# Patient Record
Sex: Female | Born: 1985 | Race: White | Hispanic: No | Marital: Married | Smoking: Never smoker
Health system: Southern US, Community
[De-identification: ages and names within clinical notes are randomized; demographics above are authoritative.]

## PROBLEM LIST (undated history)

## (undated) DIAGNOSIS — F32A Depression, unspecified: Secondary | ICD-10-CM

## (undated) DIAGNOSIS — I2699 Other pulmonary embolism without acute cor pulmonale: Secondary | ICD-10-CM

## (undated) DIAGNOSIS — I82409 Acute embolism and thrombosis of unspecified deep veins of unspecified lower extremity: Secondary | ICD-10-CM

## (undated) DIAGNOSIS — F329 Major depressive disorder, single episode, unspecified: Secondary | ICD-10-CM

## (undated) DIAGNOSIS — D6859 Other primary thrombophilia: Secondary | ICD-10-CM

## (undated) HISTORY — DX: Depression, unspecified: F32.A

## (undated) HISTORY — PX: ANKLE SURGERY: SHX546

## (undated) HISTORY — DX: Major depressive disorder, single episode, unspecified: F32.9

## (undated) SURGERY — LAPAROSCOPIC CHOLECYSTECTOMY
Anesthesia: General

## (undated) SURGERY — Surgical Case
Anesthesia: Spinal

---

## 2013-10-13 ENCOUNTER — Encounter: Payer: Self-pay | Admitting: Obstetrics and Gynecology

## 2014-02-19 ENCOUNTER — Encounter: Payer: Self-pay | Admitting: Maternal & Fetal Medicine

## 2014-05-11 ENCOUNTER — Inpatient Hospital Stay: Payer: Self-pay | Admitting: Certified Nurse Midwife

## 2014-05-11 LAB — CBC WITH DIFFERENTIAL/PLATELET
BASOS ABS: 0.1 10*3/uL (ref 0.0–0.1)
Basophil %: 0.7 %
EOS ABS: 0.1 10*3/uL (ref 0.0–0.7)
Eosinophil %: 0.7 %
HCT: 36.3 % (ref 35.0–47.0)
HGB: 12.3 g/dL (ref 12.0–16.0)
Lymphocyte #: 1.2 10*3/uL (ref 1.0–3.6)
Lymphocyte %: 11 %
MCH: 29.1 pg (ref 26.0–34.0)
MCHC: 33.9 g/dL (ref 32.0–36.0)
MCV: 86 fL (ref 80–100)
Monocyte #: 0.7 x10 3/mm (ref 0.2–0.9)
Monocyte %: 6.5 %
NEUTROS PCT: 81.1 %
Neutrophil #: 8.8 10*3/uL — ABNORMAL HIGH (ref 1.4–6.5)
Platelet: 241 10*3/uL (ref 150–440)
RBC: 4.21 10*6/uL (ref 3.80–5.20)
RDW: 13.9 % (ref 11.5–14.5)
WBC: 10.9 10*3/uL (ref 3.6–11.0)

## 2014-05-12 LAB — PROTIME-INR
INR: 1
Prothrombin Time: 12.6 secs (ref 11.5–14.7)

## 2014-05-12 LAB — APTT: Activated PTT: 26.6 secs (ref 23.6–35.9)

## 2014-05-13 LAB — HEMATOCRIT: HCT: 30.8 % — AB (ref 35.0–47.0)

## 2014-05-14 LAB — CREATININE, SERUM
Creatinine: 0.86 mg/dL (ref 0.60–1.30)
EGFR (African American): >60
EGFR (Non-African Amer.): >60

## 2014-05-21 ENCOUNTER — Emergency Department: Payer: Self-pay | Admitting: Emergency Medicine

## 2014-05-21 LAB — COMPREHENSIVE METABOLIC PANEL
ALK PHOS: 128 U/L — AB (ref 46–116)
Albumin: 3.2 g/dL — ABNORMAL LOW (ref 3.4–5.0)
Anion Gap: 8 (ref 7–16)
BILIRUBIN TOTAL: 0.2 mg/dL (ref 0.2–1.0)
BUN: 13 mg/dL (ref 7–18)
CO2: 26 mmol/L (ref 21–32)
CREATININE: 0.85 mg/dL (ref 0.60–1.30)
Calcium, Total: 9 mg/dL (ref 8.5–10.1)
Chloride: 106 mmol/L (ref 98–107)
EGFR (African American): >60
Glucose: 101 mg/dL — ABNORMAL HIGH (ref 65–99)
Osmolality: 280 (ref 275–301)
POTASSIUM: 3.8 mmol/L (ref 3.5–5.1)
SGOT(AST): 32 U/L (ref 15–37)
SGPT (ALT): 34 U/L (ref 14–63)
Sodium: 140 mmol/L (ref 136–145)
TOTAL PROTEIN: 7.5 g/dL (ref 6.4–8.2)

## 2014-05-21 LAB — CBC
HCT: 37.2 % (ref 35.0–47.0)
HGB: 12.3 g/dL (ref 12.0–16.0)
MCH: 28.7 pg (ref 26.0–34.0)
MCHC: 33 g/dL (ref 32.0–36.0)
MCV: 87 fL (ref 80–100)
Platelet: 374 10*3/uL (ref 150–440)
RBC: 4.28 10*6/uL (ref 3.80–5.20)
RDW: 14.1 % (ref 11.5–14.5)
WBC: 8.3 10*3/uL (ref 3.6–11.0)

## 2014-05-21 LAB — LIPASE, BLOOD: Lipase: 141 U/L (ref 73–393)

## 2014-05-21 LAB — PROTIME-INR
INR: 0.9
PROTHROMBIN TIME: 12 s (ref 11.5–14.7)

## 2014-05-21 LAB — APTT: Activated PTT: 28.8 secs (ref 23.6–35.9)

## 2014-05-21 LAB — TROPONIN I: Troponin-I: <0.02 ng/mL

## 2014-05-29 ENCOUNTER — Ambulatory Visit: Payer: Self-pay | Admitting: Obstetrics and Gynecology

## 2014-06-16 ENCOUNTER — Emergency Department: Payer: Self-pay | Admitting: Student

## 2014-06-29 ENCOUNTER — Encounter
Admit: 2014-06-29 | Disposition: A | Discharge status: Home / Self Care | Payer: Self-pay | Attending: Obstetrics and Gynecology | Admitting: Obstetrics and Gynecology

## 2014-07-20 ENCOUNTER — Ambulatory Visit
Admit: 2014-07-20 | Disposition: A | Discharge status: Home / Self Care | Payer: Self-pay | Attending: Internal Medicine | Admitting: Internal Medicine

## 2014-07-24 ENCOUNTER — Encounter
Admit: 2014-07-24 | Disposition: A | Discharge status: Home / Self Care | Payer: Self-pay | Attending: Obstetrics and Gynecology | Admitting: Obstetrics and Gynecology

## 2014-07-24 ENCOUNTER — Ambulatory Visit
Admit: 2014-07-24 | Disposition: A | Discharge status: Home / Self Care | Payer: Self-pay | Attending: Internal Medicine | Admitting: Internal Medicine

## 2014-07-29 ENCOUNTER — Emergency Department
Admit: 2014-07-29 | Disposition: A | Discharge status: Home / Self Care | Payer: Self-pay | Admitting: Emergency Medicine

## 2014-08-15 NOTE — Consult Note (Signed)
Referral Information:  Reason for Referral History of pulmonary embolimsm, currently pregnant   Referring Physician [facility]   Prenatal Hx Michelle Hogan is a 29 year-old G1 P0 at 77 2/7 weeks ([facility] 05/16/13) persents for recommendations concering a history of pulmonary embolism.  In 2013, Michelle Hogan developed pleuritic left-sided chest pain. Her workup demonstrated a small pulmonary embolism. She reports that there was no evidence of lower extremity clots. She was placed on Xarelto for six months, which she tolerated without difficulty. She states that she was tested for "genetic" causes of blood clots and the testing was normal. She is unsure if she was tested for antiphospholipid antibodies. She did not require ICU stay and reports no long-term complcations from her PE.  Of note, she was taking a combination OCP at the time. She transitioned to [facility], which she had removed prior to becoming pregnant.  She denies leg swelling or pain. Denies chest pain, shortness of breath or palpitations. She denies easy bruising or mucosal bleeding. No vaginal bleeding or abdominal pain.  She has not started anticoagulation this pregnancy but is taking a daily baby aspirin.   Past Obstetrical Hx G1 P0   Home Medications: Medication Instructions Status  multivitamin, prenatal   once a day Active  Aspirin Low Dose 81 mg oral delayed release tablet 1 tab(s) orally once a day Active   Allergies:   No Known Allergies:   Vital Signs/Notes:  Nursing Vital Signs: **Vital Signs.:   22-Jun-15 09:10  Temperature Temperature (F) 98.4  Pulse Pulse 77  Respirations Respirations 18  Systolic BP Systolic BP 130  Diastolic BP (mmHg) Diastolic BP (mmHg) 74  Pulse Ox % Pulse Ox % 99   Perinatal Consult:  PGyn Hx Denies history of abnormal paps or STDs   Past Medical History cont'd 1. PE as above.  2. Denies history of diabetes, hypertension or endocrine disorders   PSurg Hx Ankle fixation. Wisdom teeth  extraction. No complications with either procedure.   FHx Paternal grandfather with bladder cancer and associated VTE in his 3's. No other FH of VTE, early MI or stroke. 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 See HPI   Fever/Chills No   Abdominal Pain No   SOB/DOE No   Chest Pain No   Medications/Allergies Reviewed Medications/Allergies reviewed  No chages to above   Exam:  Today's Weight 238 pounds    Additional Lab/Radiology Notes Height: 5 feet 5 inches BMI 40 kg/m2  Prenatal labs ([facility] 10/09/13): Blood type A negative, antibody screen negative, HIV non-reactive, RPR non-reactive, Hep B neg, Varicella immune, Rubella immune, Hct 41.1, MCV 85, early glucola 128   Impression/Recommendations:  Impression Michelle Hogan is a 29 year-old G1 P0 at 64 2/7 weeks with history of pulmonary embolism. She reports being tested for genetic etiologies of VTE and that this was negatie. Her PCP is Dr. Candie Echevaria, JR. [place]. She is obese and Rh negative with a negative antibody screen.   Recommendations 1. 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 She reports a negative inhertied thrombophilia panel but is unsure if she was tested for antiphospholipid antibodies. We have requested the results of her thrombophilia panel from her PCP. We have also sent antiphospholipid antibodies today. We discussed the risk of recurrent VTE in pregnancy and the potential morbidity  and mortality associated. We discussed the need for pharmacoligic prophylactic anticoagulation. The current ACOG Practic Bulletin recommends prophylactic dosing using Lovenox at 40 mg daily. As her BMI is 40, we will use an intermediate dosing regiment of 30 mg every 12 hours, increasing to 40 mg every 12 hours at 28 weeks (see below for maternal and  fetal surveillance plan). We discussed theoretical risks of anticoagulation. She had no troubles on Xarelto prior.  2. Obestiy. Obestiy is associated with medical comlications of pregnancy such as diabetes and hypertensive disorders. Her early glucola was normal. Recommned to repeat at 28 weeks. There is also increased risk for macrosomia, abnormal labor and need for cesarean delivery.  3. Rh negative. Initial antibody screen was negative. Please repeat at 28 weeks at time of Rhogam administration. Also, utilize Rhogam with any episodes of vaginal bleeding in the pregnancy.    Comments Recs/Plan: -Antiphospholipid antibody screen sent today. If negative, can discontinue the daily baby ASA -We have requested the results of her thrombophilia testing panel from her PCP -Start Lovenox 30 mg every 12 hours. A script was provided and she was sent to [facility] for teaching -Increase to 40 mg every 12 hours at 28 weeks. We will be happy to see her back then to order -At 36 weeks, transition from Lovenox to unfractionated heparin (10,000 Units every 12 hours) -Delivery at 39 weeks, having held her heparin the night prior to and morning of delivery -SCDs on in labor -Restart Lovenox after delivery at 40 mg once daily. Start 12 hours after a vaginal delivery or 24 hours after a cesarean. Continue daily for 6 weeks -Detailed ultrasound at approximately 18 weeks. We will be happy to perform if desired -Offer aneuploidy and ONTD screening -Follow fetal growth with monthly ultrasounds starting at 28 weeks -Twice weekly fetal testing starting at 32 weeks until delivery -Check a CBC in one week to ensure a normal platelet count. Repeat one week after Lovenox dose increase at 28 weeks -Rhogam at 28 weeks    Total Time Spent with Patient 60 minutes   >50% of visit spent in couseling/coordination of care yes   Office Use Only 99244  Level 4 (60min) NEW office consult low complexity   Coding  Description: MATERNAL CONDITIONS/HISTORY INDICATION(S).   Bleeding Disorder and/or Pt on heparin/coumadin/lovenox.   Pulmonary emboli.  Electronic Signatures: Grotegut, [place] (MD)  (Signed 22-Jun-15 11:48)  Authored: Referral, Home Medications, Allergies, Vital Signs/Notes, Consult, Exam, Lab/Radiology Notes, Impression, Other Comments, Billing, Coding Description   Last Updated: 22-Jun-15 11:48 by Grotegut, [place]had (MD)

## 2014-08-15 NOTE — Consult Note (Signed)
Referral Information:  Reason for Referral History of pulmonary embolimsm, currently pregnant.  Here for follow-up consultation regarding third trimester recommendations.   Referring Physician [facility]   Prenatal Hx 29 year-old G1 P0 at 7528 5/7 weeks ([facility] 05/16/14) with history of PE in the setting of travel and OCPs.  A thrombophilia work-up, including LAC, ACA and AB2GP1 drawn this pregnancy were negative.  She is currently on 30 mg of Lovenox bid. Her next US for growth is scheduled next week.   Past Obstetrical Hx G1 P0   Home Medications: Medication Instructions Status  multivitamin, prenatal   once a day Active  Lovenox 30 mg/0.3 mL injectable solution 30 milligram(s) injectable 2 times a day Active   Allergies:   No Known Allergies:   Vital Signs/Notes:  Nursing Vital Signs: **Vital Signs.:   29-Oct-15 14:51  Vital Signs Type Routine  Temperature Temperature (F) 98.2  Celsius 36.7  Temperature Source oral  Pulse Pulse 89  Respirations Respirations 18  Systolic BP Systolic BP 128  Diastolic BP (mmHg) Diastolic BP (mmHg) 67  Mean BP 87  Pulse Ox % Pulse Ox % 98  Pulse Ox Activity Level  At rest  Oxygen Delivery Room Air/ 21 %   Perinatal Consult:  PGyn Hx Denies history of abnormal paps or STDs   Past Medical History cont'd 1. PE as above.  2. Denies history of diabetes, hypertension or endocrine disorders   PSurg Hx Ankle fixation. Wisdom teeth extraction. No complications with either procedure.   FHx Paternal grandfather with bladder cancer and associated VTE in his 5470's. No other FH of VTE, early MI or stroke. 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   Fever/Chills No   Abdominal Pain No   Diarrhea No   Constipation No   Nausea/Vomiting No   SOB/DOE No   Chest Pain No    Dysuria No   Tolerating Diet Yes   Medications/Allergies Reviewed Medications/Allergies reviewed   Exam:  Today's Weight 243 lbs, BMI=41    Additional Lab/Radiology Notes Prenatal labs ([facility] 10/09/13): Blood type A negative, antibody screen negative, HIV non-reactive, RPR non-reactive, Hep B neg, Varicella immune, Rubella immune, Hct 41.1, MCV 85, early glucola 128   Impression/Recommendations:  Impression Michelle Hogan is a 29 year-old G1 P0 at 8228 5/7 weeks with history of pulmonary embolism. Obesity. Negative thrombophilia work-up.   Recommendations 1. As her BMI is > 40, we have recommended an intermediate dosing regimen.  She is currently on 30 mg of enoxaparin every 12 hours.  When she completes her current inventory, she can increase to 40 mg every 12 hours at 28 weeks.  Please check a CBC at her next prenatal visit after her dose increase.  2. We talked about the pros and cons of staying on enoxaparin versus transitioning to unfractionated heparin at [redacted] weeks gestation.  Given that she had a PE, a higher risk event, and given that she isn't sure she wants an epidural (although we woud like her to have the opportunity), we developed a plan that she would remain on enoxparin until she either went into labor or reached [redacted] weeks gestation, at which time the enoxaparin would be held 24 hours prior to induction.  Alternatively, she could be converted to unfractionated heparin (10,000 Units every 12 hours) at  36 weeks.  3. She should have pneumatic compression devices in labor.  4.  Enoxparin should be resumed (40 mg bid) starting 12 hours after a vaginal delivery or 24 hours after a cesarean delivery and continued for at least 6 weeks postpartum.  5. She is aware that she should not use estrogen-containing contraception.  She used the copper IUD before, but had considerable cramping.  She could use the Mirena and was counseled that this would be a good, safe choice for her.  6.  Obesity.  Her early glucola was normal. She is scheduled to have her repeat next week. We would also recommend monthly ultrasounds for growth, weekly to twice weekly testing starting at 32-36 weeks and delivery at 39 weeks with the more frequent testing if she develops gestational diabetes.    7. Rh negative. RHIg per protocol.   Plan:  Ultrasound at what gestational ages Monthly > 28 weeks   Antepartum Testing Twice weekly, Starting at 32 weeks   Delivery Mode Vaginal   Delivery at what gestational age [redacted] weeks   (Removed):     Total Time Spent with Patient 45 minutes   >50% of visit spent in couseling/coordination of care yes   Office Use Only 99215  Office Visit Level 5 ( ) EST comprehensive office/outpt   Coding Description: MATERNAL CONDITIONS/HISTORY INDICATION(S).   Bleeding Disorder and/or Pt on heparin/coumadin/lovenox.   Pulmonary emboli.  Electronic Signatures: Lady Deutscher (MD)  (Signed 29-Oct-15 18:35)  Authored: Referral, Home Medications, Allergies, Vital Signs/Notes, Consult, Exam, Lab/Radiology Notes, Impression, Plan, Other Comments, Billing, Coding Description   Last Updated: 29-Oct-15 18:35 by Lady Deutscher (MD)

## 2014-08-27 ENCOUNTER — Ambulatory Visit: Payer: PRIVATE HEALTH INSURANCE | Admitting: Physical Therapy

## 2014-09-01 NOTE — H&P (Signed)
L&D Evaluation:  History:  HPI Pt is a 29 yo G1 at 39.[redacted] weeks GA with an EDC of 05/16/14 based on a 4423w5d u/s presents for IOL due to h/o PE on lovenox/heparin. PTs prenatal course is significant for obesity with a BMI of 41, ? LGA baby with fetus weighing [id number] grams at 36 weeks, and E. coli UTI. She has a history of PE and has been on lovenox and switched to heparin at 36 weeks. She is A-, RI, VI, GBS negative, and Tdap UTD.   Presents with IOL for h/o PE   Patient's Medical History pulmonary embolism, obesity,   Patient's Surgical History ankle fixation, widom teeth   Medications Pre Natal Vitamins  heparin 81mg  ASA   Allergies NKDA   Social History none   Family History Non-Contributory   ROS:  ROS All systems were reviewed.  HEENT, CNS, GI, GU, Respiratory, CV, Renal and Musculoskeletal systems were found to be normal.   Exam:  Vital Signs stable   General no apparent distress   Mental Status clear   Chest clear   Heart normal sinus rhythm   Abdomen gravid, non-tender   Pelvic no external lesions, 4/80/-1 --2 on admission   Mebranes Intact   FHT normal rate with no decels, 125, moderate variability, +accels, no decels upon admission   Ucx irregular   Skin dry, no lesions, no rashes   Lymph no lymphadenopathy   Impression:  Impression reactive NST, other, IOL for h/o PE, IUP at 39.2   Plan:  Plan EFM/NST, Pitocin induction, anticipate svd   Follow Up Appointment need to schedule   Electronic Signatures: Jannet MantisSubudhi, Hektor Huston (CNM)  (Signed 18-Jan-16 13:08)  Authored: L&D Evaluation   Last Updated: 18-Jan-16 13:08 by Jannet MantisSubudhi, Obaloluwa Delatte (CNM)

## 2014-09-10 ENCOUNTER — Ambulatory Visit: Payer: PRIVATE HEALTH INSURANCE | Admitting: Physical Therapy

## 2015-01-03 ENCOUNTER — Encounter (HOSPITAL_COMMUNITY): Payer: Self-pay | Admitting: Family Medicine

## 2015-01-03 ENCOUNTER — Emergency Department (HOSPITAL_COMMUNITY): Payer: No Typology Code available for payment source

## 2015-01-03 ENCOUNTER — Emergency Department (HOSPITAL_COMMUNITY)
Admission: EM | Admit: 2015-01-03 | Discharge: 2015-01-04 | Disposition: A | Discharge status: Home / Self Care | Payer: No Typology Code available for payment source | Attending: Emergency Medicine | Admitting: Emergency Medicine

## 2015-01-03 DIAGNOSIS — Z86711 Personal history of pulmonary embolism: Secondary | ICD-10-CM | POA: Insufficient documentation

## 2015-01-03 DIAGNOSIS — R0602 Shortness of breath: Secondary | ICD-10-CM

## 2015-01-03 DIAGNOSIS — R079 Chest pain, unspecified: Secondary | ICD-10-CM | POA: Diagnosis present

## 2015-01-03 DIAGNOSIS — Z79899 Other long term (current) drug therapy: Secondary | ICD-10-CM | POA: Insufficient documentation

## 2015-01-03 DIAGNOSIS — Z3202 Encounter for pregnancy test, result negative: Secondary | ICD-10-CM | POA: Diagnosis not present

## 2015-01-03 HISTORY — DX: Other pulmonary embolism without acute cor pulmonale: I26.99

## 2015-01-03 LAB — CBC
HCT: 41 % (ref 36.0–46.0)
HEMOGLOBIN: 13.8 g/dL (ref 12.0–15.0)
MCH: 28.1 pg (ref 26.0–34.0)
MCHC: 33.7 g/dL (ref 30.0–36.0)
MCV: 83.5 fL (ref 78.0–100.0)
PLATELETS: 240 10*3/uL (ref 150–400)
RBC: 4.91 MIL/uL (ref 3.87–5.11)
RDW: 13.6 % (ref 11.5–15.5)
WBC: 8.1 10*3/uL (ref 4.0–10.5)

## 2015-01-03 LAB — BASIC METABOLIC PANEL
ANION GAP: 10 (ref 5–15)
BUN: 19 mg/dL (ref 6–20)
CALCIUM: 9.2 mg/dL (ref 8.9–10.3)
CHLORIDE: 108 mmol/L (ref 101–111)
CO2: 21 mmol/L — ABNORMAL LOW (ref 22–32)
CREATININE: 0.63 mg/dL (ref 0.44–1.00)
GFR calc non Af Amer: >60 mL/min (ref >60)
Glucose, Bld: 148 mg/dL — ABNORMAL HIGH (ref 65–99)
Potassium: 3.7 mmol/L (ref 3.5–5.1)
SODIUM: 139 mmol/L (ref 135–145)

## 2015-01-03 LAB — PREGNANCY, URINE: PREG TEST UR: NEGATIVE

## 2015-01-03 LAB — I-STAT TROPONIN, ED: Troponin i, poc: 0 ng/mL (ref 0.00–0.08)

## 2015-01-03 LAB — D-DIMER, QUANTITATIVE (NOT AT ARMC): D DIMER QUANT: 2.5 ug{FEU}/mL — AB (ref 0.00–0.48)

## 2015-01-03 MED ORDER — ASPIRIN 81 MG PO CHEW
324.0000 mg | CHEWABLE_TABLET | Freq: Once | ORAL | Status: AC
  Administered 2015-01-03: 324 mg via ORAL
  Filled 2015-01-03: qty 4

## 2015-01-03 NOTE — ED Notes (Signed)
Patient states she is complaining of mid-sternal chest pain that radiates to her back. Complains of nausea and sweating. Pt states she was breast feeding when this pain started. She states she has had this pain 2 other times this today, lasting from 20 minutes to 1-2 hours.

## 2015-01-03 NOTE — ED Provider Notes (Signed)
CSN: [id number]     Arrival date & time 01/03/15  2020 History   First MD Initiated Contact with Patient 01/03/15 2113     Chief Complaint  Patient presents with  . Chest Pain     (Consider location/radiation/quality/duration/timing/severity/associated sxs/prior Treatment) HPI Comments: 29 year old female with history of PE in the setting of OCP use who presents with chest pain. The patient states that earlier today, she was breast-feeding and had a sudden onset of central chest pain radiating to her back. The episode lasted approximately 30 min and then resolved. She has had 2 more episodes since that time, each lasting one hour and resolving spontaneously. The chest pain is associated with some feelings of shortness of breath but she denies any worsening of the pain with deep inspiration. She endorses associated nausea and diaphoresis. She states that this does feel similar to her previous episode of PE. Only recent travel was to a location 5 hours away 1 week ago. No leg pain or swelling.  Family history negative for blood clots or cardiac disease.  Patient is a 29 y.o. female presenting with chest pain. The history is provided by the patient.  Chest Pain   Past Medical History  Diagnosis Date  . PE (pulmonary embolism)    Past Surgical History  Procedure Laterality Date  . Ankle surgery Left    History reviewed. No pertinent family history. Social History  Substance Use Topics  . Smoking status: Never Smoker   . Smokeless tobacco: None  . Alcohol Use: No   OB History    No data available     Review of Systems  Cardiovascular: Positive for chest pain.    10 Systems reviewed and are negative for acute change except as noted in the HPI.   Allergies  Review of patient's allergies indicates no known allergies.  Home Medications   Prior to Admission medications   Medication Sig Start Date End Date Taking? Authorizing Provider  OVER THE COUNTER MEDICATION Take 1 tablet  by mouth daily. Breast feeding vitamin   Yes Historical Provider, MD  Prenatal Vit-Fe Fumarate-FA (PRENATAL MULTIVITAMIN) TABS tablet Take 1 tablet by mouth daily at 12 noon.   Yes Historical Provider, MD   BP 111/57 mmHg  Pulse 66  Temp(Src) 97.5 F (36.4 C) (Oral)  Resp 23  Ht  (1.651 m)  Wt 250 lb (113.399 kg)  BMI 41.60 kg/m2  SpO2 98%  LMP 12/26/2013 (Approximate) Physical Exam  Constitutional: She is oriented to person, place, and time. She appears well-developed and well-nourished. No distress.  HENT:  Head: Normocephalic and atraumatic.  Moist mucous membranes  Eyes: Conjunctivae are normal. Pupils are equal, round, and reactive to light.  Neck: Neck supple.  Cardiovascular: Normal rate, regular rhythm and normal heart sounds.   No murmur heard. Pulmonary/Chest: Effort normal and breath sounds normal.  Abdominal: Soft. Bowel sounds are normal. She exhibits no distension. There is no tenderness.  Musculoskeletal: She exhibits no edema or tenderness.  Neurological: She is alert and oriented to person, place, and time.  Fluent speech  Skin: Skin is warm and dry.  Psychiatric: She has a normal mood and affect. Judgment normal.  Pleasant  Nursing note and vitals reviewed.   ED Course  Procedures (including critical care time) Labs Review Labs Reviewed  BASIC METABOLIC PANEL - Abnormal; Notable for the following:    CO2 21 (*)    Glucose, Bld 148 (*)    All other components within normal limits  D-DIMER, QUANTITATIVE (NOT AT [facility]) - Abnormal; Notable for the following:    D-Dimer, Quant 2.50 (*)    All other components within normal limits  CBC  PREGNANCY, URINE  I-STAT TROPOININ, ED    Imaging Review Dg Chest 2 View  01/03/2015   CLINICAL DATA:  Chest pain.  EXAM: CHEST  2 VIEW  COMPARISON:  None.  FINDINGS: The heart size and mediastinal contours are within normal limits. Both lungs are clear. No pneumothorax or pleural effusion is noted. The visualized  skeletal structures are unremarkable.  IMPRESSION: No active cardiopulmonary disease.   Electronically Signed   By: Lupita Raider, M.D.   On: 01/03/2015 21:59   I have personally reviewed and evaluated these lab results as part of my medical decision-making.   EKG Interpretation   Date/Time:  Sunday January 03 2015 20:32:23 EDT Ventricular Rate:  58 PR Interval:  154 QRS Duration: 101 QT Interval:  455 QTC Calculation: 447 R Axis:   -14 Text Interpretation:  Sinus rhythm Baseline wander No significant change  since last tracing Confirmed by Sathvika Ojo MD, Cannie Muckle [phone]) on 01/03/2015  9:41:10 PM     Medications  aspirin chewable tablet 324 mg (324 mg Oral Given 01/03/15 2208)    MDM   Final diagnoses:  None  chest pain Shortness of breath H/o PE   29 year old female with history of PE who presents with several episodes of chest pain associated with nausea, diaphoresis, and I'll shortness of breath that occurred at rest earlier today. Patient currently chest pain-free. Vital signs on arrival were unremarkable. No abnormal findings on physical exam. Obtained above lab work including troponin, gave the patient aspirin, and obtained a chest x-ray. EKG without ischemic changes.  Labwork including initial troponin unremarkable. CXR w/ no acute process.  I am concerned about PE given the patient's history of PE and the fact that she is not currently on anticoagulation, obtained a CTA of the chest. I am signing the patient out to the oncoming attending as I have reached the end of my shift. The patient's disposition is pending results of her CTA as well as repeat troponin.   Laurence Spates, MD 01/04/15 [id number]

## 2015-01-03 NOTE — ED Notes (Signed)
Pt complains of central chest pain on and off since this afternoon. Hx of PE and superficial clots. Pt on cardiac monitor, will continue to monitor.

## 2015-01-04 ENCOUNTER — Emergency Department (HOSPITAL_COMMUNITY): Payer: No Typology Code available for payment source

## 2015-01-04 ENCOUNTER — Encounter (HOSPITAL_COMMUNITY): Payer: Self-pay | Admitting: Radiology

## 2015-01-04 LAB — I-STAT TROPONIN, ED: TROPONIN I, POC: 0 ng/mL (ref 0.00–0.08)

## 2015-01-04 MED ORDER — IOHEXOL 350 MG/ML SOLN
100.0000 mL | Freq: Once | INTRAVENOUS | Status: AC | PRN
  Administered 2015-01-04: 100 mL via INTRAVENOUS

## 2015-01-04 MED ORDER — IBUPROFEN 600 MG PO TABS: 600.0000 mg | ORAL_TABLET | Freq: Four times a day (QID) | ORAL | Status: DC | PRN

## 2015-01-04 NOTE — Discharge Instructions (Signed)
We saw you in the ER for the chest pain/shortness of breath. °All of our cardiac workup is normal, including labs, EKG and chest X-RAY are normal. °We are not sure what is causing your discomfort, but we feel comfortable sending you home at this time. The workup in the ER is not complete, and you should follow up with your primary care doctor for further evaluation. ° ° °Chest Pain (Nonspecific) °It is often hard to give a specific diagnosis for the cause of chest pain. There is always a chance that your pain could be related to something serious, such as a heart attack or a blood clot in the lungs. You need to follow up with your health care provider for further evaluation. °CAUSES  °· Heartburn. °· Pneumonia or bronchitis. °· Anxiety or stress. °· Inflammation around your heart (pericarditis) or lung (pleuritis or pleurisy). °· A blood clot in the lung. °· A collapsed lung (pneumothorax). It can develop suddenly on its own (spontaneous pneumothorax) or from trauma to the chest. °· Shingles infection (herpes zoster virus). °The chest wall is composed of bones, muscles, and cartilage. Any of these can be the source of the pain. °· The bones can be bruised by injury. °· The muscles or cartilage can be strained by coughing or overwork. °· The cartilage can be affected by inflammation and become sore (costochondritis). °DIAGNOSIS  °Lab tests or other studies may be needed to find the cause of your pain. Your health care provider may have you take a test called an ambulatory electrocardiogram (ECG). An ECG records your heartbeat patterns over a 24-hour period. You may also have other tests, such as: °· Transthoracic echocardiogram (TTE). During echocardiography, sound waves are used to evaluate how blood flows through your heart. °· Transesophageal echocardiogram (TEE). °· Cardiac monitoring. This allows your health care provider to monitor your heart rate and rhythm in real time. °· Holter monitor. This is a portable  device that records your heartbeat and can help diagnose heart arrhythmias. It allows your health care provider to track your heart activity for several days, if needed. °· Stress tests by exercise or by giving medicine that makes the heart beat faster. °TREATMENT  °· Treatment depends on what may be causing your chest pain. Treatment may include: °¨ Acid blockers for heartburn. °¨ Anti-inflammatory medicine. °¨ Pain medicine for inflammatory conditions. °¨ Antibiotics if an infection is present. °· You may be advised to change lifestyle habits. This includes stopping smoking and avoiding alcohol, caffeine, and chocolate. °· You may be advised to keep your head raised (elevated) when sleeping. This reduces the chance of acid going backward from your stomach into your esophagus. °Most of the time, nonspecific chest pain will improve within 2-3 days with rest and mild pain medicine.  °HOME CARE INSTRUCTIONS  °· If antibiotics were prescribed, take them as directed. Finish them even if you start to feel better. °· For the next few days, avoid physical activities that bring on chest pain. Continue physical activities as directed. °· Do not use any tobacco products, including cigarettes, chewing tobacco, or electronic cigarettes. °· Avoid drinking alcohol. °· Only take medicine as directed by your health care provider. °· Follow your health care provider's suggestions for further testing if your chest pain does not go away. °· Keep any follow-up appointments you made. If you do not go to an appointment, you could develop lasting (chronic) problems with pain. If there is any problem keeping an appointment, call to reschedule. °SEEK   MEDICAL CARE IF:  °· Your chest pain does not go away, even after treatment. °· You have a rash with blisters on your chest. °· You have a fever. °SEEK IMMEDIATE MEDICAL CARE IF:  °· You have increased chest pain or pain that spreads to your arm, neck, jaw, back, or abdomen. °· You have  shortness of breath. °· You have an increasing cough, or you cough up blood. °· You have severe back or abdominal pain. °· You feel nauseous or vomit. °· You have severe weakness. °· You faint. °· You have chills. °This is an emergency. Do not wait to see if the pain will go away. Get medical help at once. Call your local emergency services (911 in [place] Do not drive yourself to the hospital. °MAKE SURE YOU:  °· Understand these instructions. °· Will watch your condition. °· Will get help right away if you are not doing well or get worse. °Document Released: 01/18/2005 Document Revised: 04/15/2013 Document Reviewed: 11/14/2007 °[employer]® Patient Information ©2015 [employer]. This information is not intended to replace advice given to you by your health care provider. Make sure you discuss any questions you have with your health care provider. ° °

## 2015-01-04 NOTE — ED Notes (Signed)
Patient transported to CT 

## 2015-01-04 NOTE — ED Provider Notes (Signed)
Pt with hx of PE comes in with cc of dib. Dimer is +. CT PE is pending. 2nd trop is pending.  Labs otherwise unremarkable and VSS and WNL.  Derwood Kaplan, MD 01/04/15 0041

## 2015-01-27 ENCOUNTER — Ambulatory Visit (INDEPENDENT_AMBULATORY_CARE_PROVIDER_SITE_OTHER): Payer: PRIVATE HEALTH INSURANCE | Admitting: Surgery

## 2015-01-27 ENCOUNTER — Encounter: Payer: Self-pay | Admitting: Surgery

## 2015-01-27 VITALS — BP 100/70 | HR 101 | Temp 97.9°F | Ht 65.0 in | Wt 240.0 lb

## 2015-01-27 DIAGNOSIS — K828 Other specified diseases of gallbladder: Secondary | ICD-10-CM | POA: Diagnosis not present

## 2015-01-27 DIAGNOSIS — Z86711 Personal history of pulmonary embolism: Secondary | ICD-10-CM | POA: Insufficient documentation

## 2015-01-27 NOTE — Patient Instructions (Signed)
You are able to eat non-fatty meals. Possibly become a vegetarian to decrease chances of having an attack.  If the frequency of pain increases, eventually we will have to remove your gallbladder. Call us and let us know if this happens so we could schedule your surgery.  I will give a prescription for pain and take it if you need it.    Risks for Pulmonary Embolism: Prior pulmonary embolism Obesity Post-Partum Women Smokers

## 2015-01-27 NOTE — Progress Notes (Signed)
Surgical Consultation  01/27/2015  Michelle Hogan is an 29 y.o. female.   CC: epigastric pain  HPI:  This a patient with multiple episodic bouts of epigastric pain radiating through to her back these are not necessarily associated fatty food intolerance. She has not had an episode since September 11 of this year. This all started postpartum in January or February. She's had some nausea and no emesis no fevers chills no weight loss.  She had HIDA scan which showed a 3% ejection fraction but no reproduction of her pain with the CCK.   She has a significant personal history of DVT PE  Past Medical History  Diagnosis Date  . PE (pulmonary embolism)     Past Surgical History  Procedure Laterality Date  . Ankle surgery Left     Family History  Problem Relation Age of Onset  . Hyperlipidemia Mother     Social History:  reports that she has never smoked. She does not have any smokeless tobacco history on file. She reports that she does not drink alcohol or use illicit drugs.  Allergies: No Known Allergies  Medications reviewed.   Review of Systems:   Review of Systems  Constitutional: Negative for fever and chills.  HENT: Negative.   Eyes: Negative.   Respiratory: Negative for cough, hemoptysis and shortness of breath.   Cardiovascular: Negative for chest pain, palpitations and leg swelling.  Gastrointestinal: Positive for nausea and abdominal pain. Negative for heartburn, vomiting, diarrhea, constipation, blood in stool and melena.       No acholic stools  Genitourinary: Negative.   Musculoskeletal: Negative.   Skin: Negative.   Neurological: Negative.  Negative for weakness.  Endo/Heme/Allergies: Negative.   Psychiatric/Behavioral: Negative.      Physical Exam:  LMP 12/26/2013 (Approximate)  Physical Exam  Constitutional: She is oriented to person, place, and time and well-developed, well-nourished, and in no distress.  Obese  HENT:  Head: Normocephalic and  atraumatic.  Eyes: Pupils are equal, round, and reactive to light. No scleral icterus.  Neck: Normal range of motion. Neck supple.  Cardiovascular: Normal rate, regular rhythm and normal heart sounds.   Pulmonary/Chest: Effort normal and breath sounds normal. No respiratory distress. She has no wheezes. She has no rales.  Abdominal: Soft. Bowel sounds are normal. She exhibits no distension. There is no tenderness. There is no rebound.  Musculoskeletal: Normal range of motion. She exhibits no edema.  Lymphadenopathy:    She has no cervical adenopathy.  Neurological: She is alert and oriented to person, place, and time.  Skin: Skin is warm and dry. No erythema.  Psychiatric: Mood, affect and judgment normal.      No results found for this or any previous visit (from the past 48 hour(s)). No results found.  Assessment/Plan:  This pt has recurrent pain but HIDA failed to reproduce pain with 3% EF. Disc options. Patient is at extreme risk from a personal history of pulmonary embolus she required Xarelto after her former PE and then Lovenox during pregnancy.. I discussed with her the risks associated with the surgery including bleeding infection conversion to a open procedure failure to resolve all of her symptoms and the extreme risk for pulmonary embolus. She and her husband multiple questions which were answered in detail and they have opted to observe this at this time. Her prior attacks of never lasted more than an hour most of them only 30 minutes and she has not had but one attack in September since last  spring. With that in mind we will observe this at this time and if her attacks become more frequent or severe than we could proceed with surgery with the understood risks.  Lattie Haw, MD, FACS

## 2015-07-06 ENCOUNTER — Ambulatory Visit: Payer: PRIVATE HEALTH INSURANCE | Admitting: Family Medicine

## 2015-07-12 ENCOUNTER — Encounter: Payer: Self-pay | Admitting: Family Medicine

## 2015-07-12 ENCOUNTER — Ambulatory Visit (INDEPENDENT_AMBULATORY_CARE_PROVIDER_SITE_OTHER): Payer: PRIVATE HEALTH INSURANCE | Admitting: Family Medicine

## 2015-07-12 VITALS — BP 110/72 | HR 62 | Resp 16 | Ht 65.0 in | Wt 230.4 lb

## 2015-07-12 DIAGNOSIS — E66812 Obesity, class 2: Secondary | ICD-10-CM | POA: Insufficient documentation

## 2015-07-12 DIAGNOSIS — F53 Postpartum depression: Secondary | ICD-10-CM

## 2015-07-12 DIAGNOSIS — E559 Vitamin D deficiency, unspecified: Secondary | ICD-10-CM

## 2015-07-12 DIAGNOSIS — O99345 Other mental disorders complicating the puerperium: Secondary | ICD-10-CM

## 2015-07-12 DIAGNOSIS — E669 Obesity, unspecified: Secondary | ICD-10-CM

## 2015-07-12 DIAGNOSIS — Z86711 Personal history of pulmonary embolism: Secondary | ICD-10-CM | POA: Diagnosis not present

## 2015-07-12 DIAGNOSIS — K828 Other specified diseases of gallbladder: Secondary | ICD-10-CM | POA: Diagnosis not present

## 2015-07-14 DIAGNOSIS — E559 Vitamin D deficiency, unspecified: Secondary | ICD-10-CM | POA: Insufficient documentation

## 2015-07-14 LAB — CBC
HEMOGLOBIN: 13.5 g/dL (ref 11.1–15.9)
Hematocrit: 40.8 % (ref 34.0–46.6)
MCH: 28.4 pg (ref 26.6–33.0)
MCHC: 33.1 g/dL (ref 31.5–35.7)
MCV: 86 fL (ref 79–97)
Platelets: 250 10*3/uL (ref 150–379)
RBC: 4.75 x10E6/uL (ref 3.77–5.28)
RDW: 15.3 % (ref 12.3–15.4)
WBC: 4.9 10*3/uL (ref 3.4–10.8)

## 2015-07-14 LAB — COMPREHENSIVE METABOLIC PANEL
A/G RATIO: 2.4 — AB (ref 1.2–2.2)
ALBUMIN: 5 g/dL (ref 3.5–5.5)
ALT: 16 IU/L (ref 0–32)
AST: 17 IU/L (ref 0–40)
Alkaline Phosphatase: 70 IU/L (ref 39–117)
BUN / CREAT RATIO: 23 — AB (ref 8–20)
BUN: 15 mg/dL (ref 6–20)
Bilirubin Total: 0.4 mg/dL (ref 0.0–1.2)
CALCIUM: 9.5 mg/dL (ref 8.7–10.2)
CO2: 21 mmol/L (ref 18–29)
Chloride: 102 mmol/L (ref 96–106)
Creatinine, Ser: 0.65 mg/dL (ref 0.57–1.00)
GFR, EST AFRICAN AMERICAN: 138 mL/min/{1.73_m2} (ref >59)
GFR, EST NON AFRICAN AMERICAN: 120 mL/min/{1.73_m2} (ref >59)
GLOBULIN, TOTAL: 2.1 g/dL (ref 1.5–4.5)
Glucose: 93 mg/dL (ref 65–99)
POTASSIUM: 4 mmol/L (ref 3.5–5.2)
Sodium: 139 mmol/L (ref 134–144)
TOTAL PROTEIN: 7.1 g/dL (ref 6.0–8.5)

## 2015-07-14 LAB — VITAMIN B12: VITAMIN B 12: 694 pg/mL (ref 211–946)

## 2015-07-14 LAB — LIPID PANEL
CHOLESTEROL TOTAL: 225 mg/dL — AB (ref 100–199)
Chol/HDL Ratio: 5.4 ratio units — ABNORMAL HIGH (ref 0.0–4.4)
HDL: 42 mg/dL (ref >39)
LDL Calculated: 154 mg/dL — ABNORMAL HIGH (ref 0–99)
Triglycerides: 143 mg/dL (ref 0–149)
VLDL CHOLESTEROL CAL: 29 mg/dL (ref 5–40)

## 2015-07-14 LAB — HEMOGLOBIN A1C
Est. average glucose Bld gHb Est-mCnc: 108 mg/dL
Hgb A1c MFr Bld: 5.4 % (ref 4.8–5.6)

## 2015-07-14 LAB — VITAMIN D 25 HYDROXY (VIT D DEFICIENCY, FRACTURES): Vit D, 25-Hydroxy: 21.4 ng/mL — ABNORMAL LOW (ref 30.0–100.0)

## 2015-07-14 LAB — TSH: TSH: 1.69 u[IU]/mL (ref 0.450–4.500)

## 2015-07-14 MED ORDER — VITAMIN D 50 MCG (2000 UT) PO CAPS: 1.0000 | ORAL_CAPSULE | Freq: Every day | ORAL | Status: DC

## 2015-07-14 NOTE — Addendum Note (Signed)
Addended by: Schuyler AmorPLONK, Taelar Gronewold on: 07/14/2015 11:59 AM   Modules accepted: Orders

## 2015-07-14 NOTE — Progress Notes (Signed)
Date:  07/12/2015   Name:  Michelle Hogan   DOB:  November 07, 1985   MRN:  [id number]  PCP:  Mechele DawleyLONG,THOMAS JR, MD    Chief Complaint: Establish Care   History of Present Illness:  This is a 30 y.o. female to establish care. Hx PE on OCP's, took Lovenox while pregnant, had negative heme w/u, off anticoags since delivering 15 months ago, no recurrent sxs. Surgeon saw for biliary dyskinesia in October, advised against surgery given PE hx. No recent GB attacks. Losing weight on weight watchers. Mother with obesity, MGF with DM. GYN started Lexapro for postpartum depression last week, no se's noted. Tetanus status unsure, will check imm records.   Review of Systems:  Review of Systems  Constitutional: Negative for fever.  Respiratory: Negative for cough and shortness of breath.   Cardiovascular: Negative for chest pain and leg swelling.  Genitourinary: Negative for difficulty urinating.  Neurological: Negative for syncope and light-headedness.    Patient Active Problem List   Diagnosis Date Noted  . Obesity, Class II, BMI 35-39.9 07/12/2015  . Dysfunctional gallbladder 07/12/2015  . Hx pulmonary embolism 01/27/2015    Prior to Admission medications   Medication Sig Start Date End Date Taking? Authorizing Provider  escitalopram (LEXAPRO) 10 MG tablet Take 10 mg by mouth daily. 1/2 pill for 3 days then will take whole   Yes Historical Provider, MD    No Known Allergies  Past Surgical History  Procedure Laterality Date  . Ankle surgery Left     Social History  Substance Use Topics  . Smoking status: Never Smoker   . Smokeless tobacco: Never Used  . Alcohol Use: 0.6 oz/week    0 Standard drinks or equivalent, 1 Cans of beer per week     Comment: rarely    Family History  Problem Relation Age of Onset  . Hyperlipidemia Mother     Medication list has been reviewed and updated.  Physical Examination: BP 110/72 mmHg  Pulse 62  Resp 16  Ht 5\' 5"  (1.651 m)  Wt 230 lb 6.4 oz  (104.509 kg)  BMI 38.34 kg/m2  Physical Exam  Constitutional: She is oriented to person, place, and time. She appears well-developed and well-nourished.  HENT:  Right Ear: External ear normal.  Left Ear: External ear normal.  Nose: Nose normal.  Mouth/Throat: Oropharynx is clear and moist.  TM's clear  Eyes: Conjunctivae and EOM are normal. Pupils are equal, round, and reactive to light.  Neck: Neck supple. No thyromegaly present.  Cardiovascular: Regular rhythm and normal heart sounds.   Pulmonary/Chest: Effort normal and breath sounds normal.  Abdominal: Soft. She exhibits no distension and no mass. There is no tenderness.  Musculoskeletal: She exhibits no edema.  Lymphadenopathy:    She has no cervical adenopathy.  Neurological: She is alert and oriented to person, place, and time. Coordination normal.  Skin: Skin is warm and dry.  Psychiatric: She has a normal mood and affect. Her behavior is normal.  Nursing note and vitals reviewed.   Assessment and Plan:  1. Obesity, Class II, BMI 35-39.9 Cont Weight Watchers - Comprehensive Metabolic Panel (CMET) - CBC - TSH - Vitamin D (25 hydroxy) - HgB A1c - Lipid Profile  2. Dysfunctional gallbladder Avoid fatty/fried foods  3. Hx pulmonary embolism Stable off anticoags  4. Postpartum depression Cont Lexapro per GYN - B12  Return in about 4 weeks (around 08/09/2015).  Michelle Hogan, Jr. MD [facility]  07/14/2015

## 2015-08-09 ENCOUNTER — Ambulatory Visit: Payer: PRIVATE HEALTH INSURANCE | Admitting: Family Medicine

## 2015-08-12 ENCOUNTER — Encounter: Payer: Self-pay | Admitting: Family Medicine

## 2015-08-12 ENCOUNTER — Ambulatory Visit (INDEPENDENT_AMBULATORY_CARE_PROVIDER_SITE_OTHER): Payer: PRIVATE HEALTH INSURANCE | Admitting: Family Medicine

## 2015-08-12 VITALS — BP 118/82 | HR 74 | Ht 65.0 in | Wt 225.6 lb

## 2015-08-12 DIAGNOSIS — Z8249 Family history of ischemic heart disease and other diseases of the circulatory system: Secondary | ICD-10-CM

## 2015-08-12 DIAGNOSIS — E669 Obesity, unspecified: Secondary | ICD-10-CM | POA: Diagnosis not present

## 2015-08-12 DIAGNOSIS — E785 Hyperlipidemia, unspecified: Secondary | ICD-10-CM

## 2015-08-12 DIAGNOSIS — E559 Vitamin D deficiency, unspecified: Secondary | ICD-10-CM | POA: Diagnosis not present

## 2015-08-12 DIAGNOSIS — F53 Postpartum depression: Secondary | ICD-10-CM

## 2015-08-12 DIAGNOSIS — O99345 Other mental disorders complicating the puerperium: Secondary | ICD-10-CM

## 2015-08-12 DIAGNOSIS — E66812 Obesity, class 2: Secondary | ICD-10-CM

## 2015-08-12 HISTORY — DX: Postpartum depression: F53.0

## 2015-08-12 NOTE — Progress Notes (Addendum)
Date:  08/12/2015   Name:  Michelle Hogan   DOB:  Jun 08, 1985   MRN:  [id number]  PCP:  Mechele DawleyLONG,THOMAS JR, MD    Chief Complaint: Follow-up   History of Present Illness:  This is a 30 y.o. female seen in one month f/u from initial visit. Father has had interval heart attack at age 10153, risk factors included FH and HLD. Blood work from last visit showed HLD and low vit D, on supplement. Hoping to get pregnant again soon. In weight watchers, weight down 5# past month. No regular exercise.  Review of Systems:  Review of Systems  Constitutional: Negative for fever and fatigue.  Respiratory: Negative for cough and shortness of breath.   Cardiovascular: Negative for chest pain and leg swelling.  Neurological: Negative for syncope and light-headedness.    Patient Active Problem List   Diagnosis Date Noted  . Hyperlipidemia 08/12/2015  . FH: heart disease 08/12/2015  . Vitamin D deficiency 07/14/2015  . Obesity, Class II, BMI 35-39.9 07/12/2015  . Dysfunctional gallbladder 07/12/2015  . Hx pulmonary embolism 01/27/2015    Prior to Admission medications   Medication Sig Start Date End Date Taking? Authorizing Provider  Cholecalciferol (VITAMIN D) 2000 units CAPS Take 1 capsule (2,000 Units total) by mouth daily. 07/14/15  Yes Schuyler AmorWilliam Cosimo Schertzer, MD  escitalopram (LEXAPRO) 10 MG tablet Take 10 mg by mouth daily. 1/2 pill for 3 days then will take whole   Yes Historical Provider, MD  Prenatal Multivit-Min-Fe-FA (PRE-NATAL FORMULA) TABS Take by mouth.   Yes Historical Provider, MD    No Known Allergies  Past Surgical History  Procedure Laterality Date  . Ankle surgery Left     Social History  Substance Use Topics  . Smoking status: Never Smoker   . Smokeless tobacco: Never Used  . Alcohol Use: 0.6 oz/week    0 Standard drinks or equivalent, 1 Cans of beer per week     Comment: rarely    Family History  Problem Relation Age of Onset  . Hyperlipidemia Mother   . Heart attack Father      Medication list has been reviewed and updated.  Physical Examination: BP 118/82 mmHg  Pulse 74  Ht 5\' 5"  (1.651 m)  Wt 225 lb 9.6 oz (102.331 kg)  BMI 37.54 kg/m2  Physical Exam  Constitutional: She appears well-developed and well-nourished.  Cardiovascular: Normal rate, regular rhythm and normal heart sounds.   Pulmonary/Chest: Effort normal and breath sounds normal.  Musculoskeletal: She exhibits no edema.  Neurological: She is alert.  Skin: Skin is warm and dry.  Psychiatric: She has a normal mood and affect. Her behavior is normal.  Nursing note and vitals reviewed.   Assessment and Plan:  1. Obesity, Class II, BMI 35-39.9 Improved with Weight Watchers, exercise 150 mins/wk discussed  2. Vitamin D deficiency On supplement, consider repeat level next visit  3. Hyperlipidemia Lipid profile reviewed, avoid saturated and trans fats, should improve with further weight loss and exercise  4. FH: heart disease Cardiac risk factors discussed  5. Postpartum depression Discuss with GYN possible d/c Lexapro while trying to conceive  Return in about 3 months (around 11/11/2015).  [name], Jr. MD [facility]  08/12/2015

## 2015-08-18 ENCOUNTER — Encounter: Payer: Self-pay | Admitting: Licensed Clinical Social Worker

## 2015-08-18 ENCOUNTER — Ambulatory Visit (INDEPENDENT_AMBULATORY_CARE_PROVIDER_SITE_OTHER): Payer: PRIVATE HEALTH INSURANCE | Admitting: Licensed Clinical Social Worker

## 2015-08-18 ENCOUNTER — Ambulatory Visit: Payer: PRIVATE HEALTH INSURANCE | Admitting: Licensed Clinical Social Worker

## 2015-08-18 DIAGNOSIS — F321 Major depressive disorder, single episode, moderate: Secondary | ICD-10-CM | POA: Insufficient documentation
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
## 2015-09-06 ENCOUNTER — Ambulatory Visit (INDEPENDENT_AMBULATORY_CARE_PROVIDER_SITE_OTHER): Payer: PRIVATE HEALTH INSURANCE | Admitting: Psychiatry

## 2015-09-06 ENCOUNTER — Encounter: Payer: Self-pay | Admitting: Psychiatry

## 2015-09-06 VITALS — BP 122/74 | HR 82 | Temp 97.9°F | Ht 65.0 in | Wt 228.0 lb

## 2015-09-06 DIAGNOSIS — F321 Major depressive disorder, single episode, moderate: Secondary | ICD-10-CM

## 2015-09-06 MED ORDER — MELATONIN 5 MG PO TABS: 5.0000 mg | ORAL_TABLET | Freq: Every day | ORAL | Status: DC

## 2015-09-06 NOTE — Progress Notes (Signed)
Psychiatric Initial Adult Assessment   Patient Identification: Lenox Pondsmanda D Abelson MRN:  [id number] Date of Evaluation:  09/06/2015 Referral Source: Corrie DandyMary- Therapist  Chief Complaint:   Chief Complaint    Establish Care; Depression     Visit Diagnosis:    ICD-9-CM ICD-10-CM   1. Major depressive disorder, single episode, moderate (HCC) 296.22 F32.1     History of Present Illness:   Patient is a 30 year old female who presented for initial assessment. [facility] Patient reported that she was started on Lexapro by her OB/GYN after she started experiencing depression 14 months after the birth of first son. She reported that she was doing well initially but started going downhill and was having worsening of her depressive symptoms. Since he has started taking the Lexapro she feels that her symptoms have improved. She has energy and she is able to take care of her son. She  reported that she wants to decrease the dose of Lexapro as she is planning to become  pregnant again. Her husband is very supportive and they have a good relationship. Patient reported that she does not have any depression, anxiety or mood swings. She currently denied having any suicidal homicidal ideations or plans. She spends most of the time at home with her son. She stated that she does not have any adult interaction at this time. She is very excited as her mother-in-law is trying to move closer to the house. [place] and her mother is very busy.  Associated Signs/Symptoms: Depression Symptoms:  fatigue, anxiety, (Hypo) Manic Symptoms:  none Anxiety Symptoms:  Excessive Worry, Psychotic Symptoms:  none PTSD Symptoms: Negative NA  Past Psychiatric History:  Patient reported that she was a started on Lexapro by her OB/GYN. She is responding well to the medication. She denied any previous history of suicide. She denied any previous psychiatric  hospitalization.  Previous Psychotropic Medications: Lexapro  Substance Abuse History in the last 12 months:  No.  Consequences of Substance Abuse: Negative NA  Past Medical History:  Past Medical History  Diagnosis Date  . PE (pulmonary embolism)   . Depression     Past Surgical History  Procedure Laterality Date  . Ankle surgery Left     Family Psychiatric History:  Denied h/o mental illness in family   Family History:  Family History  Problem Relation Age of Onset  . Hyperlipidemia Mother   . Depression Mother   . Heart attack Father   . Diabetes Maternal Grandfather   . Bladder Cancer Maternal Grandfather     Social History:   Social History   Social History  . Marital Status: Married    Spouse Name: N/A  . Number of Children: N/A  . Years of Education: N/A   Social History Main Topics  . Smoking status: Never Smoker   . Smokeless tobacco: Never Used  . Alcohol Use: 0.6 oz/week    1 Cans of beer, 0 Standard drinks or equivalent per week     Comment: rarely  . Drug Use: No  . Sexual Activity: Yes    Birth Control/ Protection: IUD   Other Topics Concern  . None   Social History Narrative    Additional Social History:  Married x 2 years. Has 2116 months old son.  Stay at home mother.   Allergies:  No Known Allergies  Metabolic Disorder Labs: Lab Results  Component Value Date   HGBA1C 5.4 07/13/2015   No  results found for: PROLACTIN Lab Results  Component Value Date   CHOL 225* 07/13/2015   TRIG 143 07/13/2015   HDL 42 07/13/2015   CHOLHDL 5.4* 07/13/2015   LDLCALC 154* 07/13/2015     Current Medications: Current Outpatient Prescriptions  Medication Sig Dispense Refill  . Cholecalciferol (VITAMIN D) 2000 units CAPS Take 1 capsule (2,000 Units total) by mouth daily. 30 capsule   . escitalopram (LEXAPRO) 10 MG tablet Take 10 mg by mouth daily. 1/2 pill for 3 days then will take whole    . Prenatal Multivit-Min-Fe-FA (PRE-NATAL  FORMULA) TABS Take by mouth.     No current facility-administered medications for this visit.    Neurologic: Headache: No Seizure: No Paresthesias:No  Musculoskeletal: Strength & Muscle Tone: within normal limits Gait & Station: normal Patient leans: N/A  Psychiatric Specialty Exam: ROS  Blood pressure 122/74, pulse 82, temperature 97.9 F (36.6 C), temperature source Tympanic, height  (1.651 m), weight 228 lb (103.42 kg), SpO2 97 %.Body mass index is 37.94 kg/(m^2).  General Appearance: Casual  Eye Contact:  Fair  Speech:  Clear and Coherent  Volume:  Normal  Mood:  Anxious  Affect:  Congruent  Thought Process:  Coherent  Orientation:  Full (Time, Place, and Person)  Thought Content:  WDL  Suicidal Thoughts:  No  Homicidal Thoughts:  No  Memory:  Immediate;   Fair  Judgement:  Fair  Insight:  Fair  Psychomotor Activity:  Normal  Concentration:  Fair  Recall:  [employer] of Knowledge:Fair  Language: Fair  Akathisia:  No  Handed:  Right  AIMS (if indicated):    Assets:  Communication Skills Desire for Improvement Leisure Time Physical Health Social Support  ADL's:  Intact  Cognition: WNL  Sleep:  well    Treatment Plan Summary: Medication management   Discussed with patient about her medications. She will decrease Lexapro 5 mg at bedtime. Will  follow up in a month or earlier depending on her symptoms.   More than 50% of the time spent in psychoeducation, counseling and coordination of care.    This note was generated in part or whole with voice recognition software. Voice regonition is usually quite accurate but there are transcription errors that can and very often do occur. I apologize for any typographical errors that were not detected and corrected.      Brandy Hale, MD 5/15/20173:15 PM

## 2015-10-07 ENCOUNTER — Encounter: Payer: Self-pay | Admitting: Psychiatry

## 2015-10-07 ENCOUNTER — Ambulatory Visit (INDEPENDENT_AMBULATORY_CARE_PROVIDER_SITE_OTHER): Payer: PRIVATE HEALTH INSURANCE | Admitting: Psychiatry

## 2015-10-07 VITALS — BP 122/70 | HR 62 | Temp 98.4°F | Ht 65.0 in | Wt 227.4 lb

## 2015-10-07 DIAGNOSIS — F321 Major depressive disorder, single episode, moderate: Secondary | ICD-10-CM | POA: Diagnosis not present

## 2015-10-07 NOTE — Progress Notes (Signed)
Psychiatric MD Follow up Note Patient Identification: Michelle Hogan MRN:  [id number] Date of Evaluation:  10/07/2015 Referral Source: [facility]- Therapist  Chief Complaint:   Chief Complaint    Follow-up; Medication Refill     Visit Diagnosis:    ICD-9-CM ICD-10-CM   1. Major depressive disorder, single episode, moderate (HCC) 296.22 F32.1     History of Present Illness:   Patient is a 30 year old female who presented for Follow-up. [facility]. Patient reported that she was started on Lexapro by her OB/GYN after she started experiencing depression 14 months after the birth of first son. She reported that she was doing well initially but started going downhill and was having worsening of her depressive symptoms. She reported that she has decrease the dose of Lexapro 5 mg and is doing well. She is not experiencing any worsening of her symptoms. She wants to stop the medication and would like to start therapy again. She reported that her husband is very supportive and he has good relationship with her. She appeared calm and cooperative during the interview. She denied having any mood swings anger anxiety or paranoia.   .  Associated Signs/Symptoms: Depression Symptoms:  fatigue, anxiety, (Hypo) Manic Symptoms:  none Anxiety Symptoms:  Excessive Worry, Psychotic Symptoms:  none PTSD Symptoms: Negative NA  Past Psychiatric History:  Patient reported that she was a started on Lexapro by her OB/GYN. She is responding well to the medication. She denied any previous history of suicide. She denied any previous psychiatric hospitalization.  Previous Psychotropic Medications: Lexapro  Substance Abuse History in the last 12 months:  No.  Consequences of Substance Abuse: Negative NA  Past Medical History:  Past Medical History  Diagnosis Date  . PE (pulmonary embolism)   . Depression     Past Surgical History  Procedure Laterality Date  . Ankle  surgery Left     Family Psychiatric History:  Denied h/o mental illness in family   Family History:  Family History  Problem Relation Age of Onset  . Hyperlipidemia Mother   . Depression Mother   . Heart attack Father   . Diabetes Maternal Grandfather   . Bladder Cancer Maternal Grandfather     Social History:   Social History   Social History  . Marital Status: Married    Spouse Name: N/A  . Number of Children: N/A  . Years of Education: N/A   Social History Main Topics  . Smoking status: Never Smoker   . Smokeless tobacco: Never Used  . Alcohol Use: 0.6 oz/week    1 Cans of beer, 0 Standard drinks or equivalent per week     Comment: rarely  . Drug Use: No  . Sexual Activity: Yes    Birth Control/ Protection: IUD   Other Topics Concern  . None   Social History Narrative    Additional Social History:  Married x 2 years. Has 2716 months old son.  Stay at home mother.   Allergies:  No Known Allergies  Metabolic Disorder Labs: Lab Results  Component Value Date   HGBA1C 5.4 07/13/2015   No results found for: PROLACTIN Lab Results  Component Value Date   CHOL 225* 07/13/2015   TRIG 143 07/13/2015   HDL 42 07/13/2015   CHOLHDL 5.4* 07/13/2015   LDLCALC 154* 07/13/2015     Current Medications: Current Outpatient Prescriptions  Medication Sig Dispense Refill  . Cholecalciferol (VITAMIN D) 2000 units CAPS Take 1 capsule (2,000  Units total) by mouth daily. 30 capsule   . escitalopram (LEXAPRO) 10 MG tablet Take 10 mg by mouth daily. 1/2 pill for 3 days then will take whole    . Melatonin 5 MG TABS Take 1 tablet (5 mg total) by mouth daily after supper. 30 tablet 0  . Prenatal Multivit-Min-Fe-FA (PRE-NATAL FORMULA) TABS Take by mouth.     No current facility-administered medications for this visit.    Neurologic: Headache: No Seizure: No Paresthesias:No  Musculoskeletal: Strength & Muscle Tone: within normal limits Gait & Station: normal Patient  leans: N/A  Psychiatric Specialty Exam: ROS   Blood pressure 122/70, pulse 62, temperature 98.4 F (36.9 C), temperature source Tympanic, height  (1.651 m), weight 227 lb 6.4 oz (103.148 kg), SpO2 98 %.Body mass index is 37.84 kg/(m^2).  General Appearance: Casual  Eye Contact:  Fair  Speech:  Clear and Coherent  Volume:  Normal  Mood:  Anxious  Affect:  Congruent  Thought Process:  Coherent  Orientation:  Full (Time, Place, and Person)  Thought Content:  WDL  Suicidal Thoughts:  No  Homicidal Thoughts:  No  Memory:  Immediate;   Fair  Judgement:  Fair  Insight:  Fair  Psychomotor Activity:  Normal  Concentration:  Fair  Recall:  [employer] of Knowledge:Fair  Language: Fair  Akathisia:  No  Handed:  Right  AIMS (if indicated):    Assets:  Communication Skills Desire for Improvement Leisure Time Physical Health Social Support  ADL's:  Intact  Cognition: WNL  Sleep:  well    Treatment Plan Summary: Medication management   Discussed with patient about her medications. She will Stop Lexapro and will follow up with Felecia Jan in the community. She will call for follow-up appointment as needed   More than 50% of the time spent in psychoeducation, counseling and coordination of care.    This note was generated in part or whole with voice recognition software. Voice regonition is usually quite accurate but there are transcription errors that can and very often do occur. I apologize for any typographical errors that were not detected and corrected.      Brandy Hale, MD 6/15/20173:56 PM

## 2015-11-11 ENCOUNTER — Ambulatory Visit (INDEPENDENT_AMBULATORY_CARE_PROVIDER_SITE_OTHER): Payer: PRIVATE HEALTH INSURANCE | Admitting: Family Medicine

## 2015-11-11 ENCOUNTER — Ambulatory Visit: Payer: Self-pay | Admitting: Family Medicine

## 2015-11-11 ENCOUNTER — Encounter: Payer: Self-pay | Admitting: Family Medicine

## 2015-11-11 VITALS — BP 108/72 | HR 85 | Temp 97.7°F | Wt 228.0 lb

## 2015-11-11 DIAGNOSIS — Z86711 Personal history of pulmonary embolism: Secondary | ICD-10-CM | POA: Diagnosis not present

## 2015-11-11 NOTE — Progress Notes (Signed)
Name: Michelle Hogan   MRN: [id number]    DOB: 04/17/1986   Date:11/11/2015       Progress Note  Subjective  Chief Complaint  Chief Complaint  Patient presents with  . Depression    follow up on lexapro  . History of PE    she's had a PE in the past and now brother has had a DVT. Wonders if it could be genetic. Also should she be on something to help prevent them?    HPI Comments: Patient presents for followup of pulmonary emboli 2013.    No problem-specific assessment & plan notes found for this encounter.   Past Medical History  Diagnosis Date  . PE (pulmonary embolism)   . Depression     Past Surgical History  Procedure Laterality Date  . Ankle surgery Left     Family History  Problem Relation Age of Onset  . Hyperlipidemia Mother   . Depression Mother   . Heart attack Father   . Heart disease Father   . Diabetes Maternal Grandfather   . Bladder Cancer Maternal Grandfather   . Deep vein thrombosis Brother     Social History   Social History  . Marital Status: Married    Spouse Name: N/A  . Number of Children: N/A  . Years of Education: N/A   Occupational History  . Not on file.   Social History Main Topics  . Smoking status: Never Smoker   . Smokeless tobacco: Never Used  . Alcohol Use: 0.6 oz/week    1 Cans of beer, 0 Standard drinks or equivalent per week     Comment: rarely  . Drug Use: No  . Sexual Activity: Yes    Birth Control/ Protection: IUD   Other Topics Concern  . Not on file   Social History Narrative    No Known Allergies   Review of Systems  Constitutional: Negative for fever, chills, weight loss and malaise/fatigue.  HENT: Negative for ear discharge, ear pain and sore throat.   Eyes: Negative for blurred vision.  Respiratory: Negative for cough, sputum production, shortness of breath and wheezing.   Cardiovascular: Negative for chest pain, palpitations and leg swelling.  Gastrointestinal: Negative for heartburn, nausea,  abdominal pain, diarrhea, constipation, blood in stool and melena.  Genitourinary: Negative for dysuria, urgency, frequency and hematuria.  Musculoskeletal: Negative for myalgias, back pain, joint pain and neck pain.  Skin: Negative for rash.  Neurological: Negative for dizziness, tingling, sensory change, focal weakness and headaches.  Endo/Heme/Allergies: Negative for environmental allergies and polydipsia. Does not bruise/bleed easily.  Psychiatric/Behavioral: Negative for depression and suicidal ideas. The patient is not nervous/anxious and does not have insomnia.      Objective  Filed Vitals:   11/11/15 1334  BP: 108/72  Pulse: 85  Temp: 97.7 F (36.5 C)  Weight: 228 lb (103.42 kg)  SpO2: 98%    Physical Exam  Constitutional: She is well-developed, well-nourished, and in no distress. No distress.  HENT:  Head: Normocephalic and atraumatic.  Right Ear: External ear normal.  Left Ear: External ear normal.  Nose: Nose normal.  Mouth/Throat: Oropharynx is clear and moist.  Eyes: Conjunctivae and EOM are normal. Pupils are equal, round, and reactive to light. Right eye exhibits no discharge. Left eye exhibits no discharge.  Neck: Normal range of motion. Neck supple. No JVD present. No thyromegaly present.  Cardiovascular: Normal rate, regular rhythm, normal heart sounds and intact distal pulses.  Exam reveals no gallop and  no friction rub.   No murmur heard. Pulmonary/Chest: Effort normal and breath sounds normal.  Abdominal: Soft. Bowel sounds are normal. She exhibits no mass. There is no tenderness. There is no guarding.  Musculoskeletal: Normal range of motion. She exhibits no edema.  Lymphadenopathy:    She has no cervical adenopathy.  Neurological: She is alert. She has normal reflexes.  Skin: Skin is warm and dry. She is not diaphoretic.  Psychiatric: Mood and affect normal.  Nursing note and vitals reviewed.     Assessment & Plan  Problem List Items Addressed  This Visit      Other   Hx pulmonary embolism - Primary   Relevant Orders   Ambulatory referral to Hematology        Dr. Elizabeth [facility]reanna Jones [facility]  11/11/2015

## 2015-12-20 ENCOUNTER — Ambulatory Visit
Admission: RE | Admit: 2015-12-20 | Discharge: 2015-12-20 | Disposition: A | Discharge status: Home / Self Care | Payer: PRIVATE HEALTH INSURANCE | Source: Ambulatory Visit | Attending: Obstetrics & Gynecology | Admitting: Obstetrics & Gynecology

## 2015-12-20 VITALS — BP 100/74 | HR 75 | Temp 98.5°F | Resp 18 | Ht 65.0 in | Wt 234.0 lb

## 2015-12-20 DIAGNOSIS — O0990 Supervision of high risk pregnancy, unspecified, unspecified trimester: Secondary | ICD-10-CM | POA: Diagnosis not present

## 2015-12-20 DIAGNOSIS — E669 Obesity, unspecified: Secondary | ICD-10-CM

## 2015-12-20 DIAGNOSIS — Z86711 Personal history of pulmonary embolism: Secondary | ICD-10-CM

## 2015-12-20 MED ORDER — ENOXAPARIN SODIUM 30 MG/0.3ML ~~LOC~~ SOLN: 30.0000 mg | Freq: Two times a day (BID) | SUBCUTANEOUS | 6 refills | Status: DC

## 2015-12-20 NOTE — Progress Notes (Signed)
Maternal-Fetal Medicine Consultation  Referring practice: [facility] Reason for referral: History of PE, currently pregnant  Michelle Hogan is a 30 year-old G2 P1001 at 7 1/7  weeks ([facility] 08/06/16) persents for recommendations concering a history of pulmonary embolism.  In 2013, Zilpha developed pleuritic left-sided chest pain. Her workup demonstrated a small pulmonary embolism. She reports that there was no evidence of lower extremity clots. She was placed on Xarelto for six months, which she tolerated without difficulty. She states that she was tested for "genetic" causes of blood clots and the testing was normal. She did not require ICU stay and reports no long-term complcations from her PE.  Of note, she was taking a combination OCP at the time. She transitioned to [facility], which she had removed prior to becoming pregnant.  She denies leg swelling or pain. Denies chest pain, shortness of breath or palpitations. She denies easy bruising or mucosal bleeding. No vaginal bleeding or abdominal pain.  She has not started anticoagulation this pregnancy but is taking a daily baby aspirin.  We saw her for her last pregnancy. In that pregnancy we elected for immediate prophylactic dosing of Lovenox given an elevated BMI. She took 30 mg twice daily increasing to 40 mg twice daily at 28 weeks. In her postpartum period she developed an episode of superficial thrombophlebitis but states that she was not taking her Lovenox regularly.  Otherwise, she did well.  She has no complaints. She has an [place] scheduled at [facility]. She denies abdominal pain or vaginal bleeding.   Her brother recently had a DVT and was diagnosed with Protein S deficiency. Makaylen's Protein S levels had been normal.  In addition, we checked her APS labs last pregnancy (anti beta2 glycoprotein, anticardiolipin antibody and lupus anticoagulant) and those were normal.  PMH: history of pulmonary embolism as above. Decreased gall bladder emptying  but no stones, depression with first diagnosis postpartum PSH: Ankle fixation, wisdom teeth extraction PObH: G2 P1001, 2015 fullterm IOL and SVD at 39 weeks. No comlications. Meds; Baby aspiring, PNV, Vitamin D, Lexapro All: NKDA SH: No tobacco, Married FH: Paternal grandfather with bladder cancer and associated VTE in his 15's. No other FH of VTE, early MI or stroke. Denies FH of birth defects, mental retardation or genetic disorders  Exam: Vitals:   12/20/15 1100  BP: 100/74  Pulse: 75  Resp: 18  Temp: 98.5 F (36.9 C)   Prenatal labs not provided   Assessment and Recommendations: Michelle Hogan is a 30 year-old G2 P1001 at 7 1/7 weeks with history of pulmonary embolism. She reports being tested for genetic etiologies of VTE and that this was negatie. We performed APS testing in 2015 and these were negative. She is obese.    1. 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 She reports a negative inhertied thrombophilia panel and we repeated APS labs in 2015, which were normal.  We discussed the risk of recurrent VTE in pregnancy and the potential morbidity and mortality associated. We discussed the need for pharmacoligic prophylactic anticoagulation. The current ACOG Practic Bulletin recommends prophylactic dosing using Lovenox at 40 mg daily. As her BMI is between 35 and 40, we will use an intermediate dosing regiment of 30 mg every 12 hours, increasing to 40 mg every 12 hours at 28 weeks (see below for maternal and fetal surveillance plan). We discussed theoretical risks of anticoagulation. She had no troubles on Xarelto prior.  2. Obestiy. Obestiy is associated with medical comlications of pregnancy such as diabetes  and hypertensive disorders. Recommend early glucola and if normal to repeat at 28 weeks. There is also increased risk for macrosomia, abnormal labor and need for cesarean delivery.  3. Rh negative. Please check her antibody screen. If positive, please refer back. Then  please repeat at 28 weeks at time of Rhogam administration. Also, utilize Rhogam with any episodes of vaginal bleeding in the pregnancy.   Recs/Plan: -can discontinue baby aspirin -Start Lovenox 30 mg every 12 hours. A script was provided and she knows how to use -Increase to 40 mg every 12 hours at 28 weeks. We will be happy to see her back then to order. -Delivery at 39 weeks, having held her Lovenox the night prior to and morning of delivery -SCDs on in labor -Restart Lovenox after delivery at 40 mg once daily. Start 12 hours after a vaginal delivery or 24 hours after a cesarean. Continue daily for 6 weeks -Detailed ultrasound at approximately 18 weeks. We will be happy to perform if desired -Offer aneuploidy and ONTD screening -Follow fetal growth with monthly ultrasounds starting at 28 weeks -Twice weekly fetal testing starting at 32 weeks until delivery -Check a CBC and LMWH level in one week to ensure a normal platelet count and not overly anticoagulated. Repeat CBC again in two weeks.  When increasing there dose to 40 mg every 12 hours at 28 weeks then also check a CBC and LMWH level one week after Lovenox dose increase at 28 weeks -Rhogam at 28 weeks and with any episodes of vaginal bleeding.   Leighana Neyman, [place] A, MD

## 2015-12-21 ENCOUNTER — Inpatient Hospital Stay: Payer: PRIVATE HEALTH INSURANCE | Admitting: Internal Medicine

## 2015-12-23 ENCOUNTER — Ambulatory Visit: Payer: PRIVATE HEALTH INSURANCE

## 2016-01-04 ENCOUNTER — Inpatient Hospital Stay: Payer: PRIVATE HEALTH INSURANCE | Attending: Internal Medicine | Admitting: Internal Medicine

## 2016-01-04 ENCOUNTER — Inpatient Hospital Stay: Payer: PRIVATE HEALTH INSURANCE

## 2016-01-04 ENCOUNTER — Encounter: Payer: Self-pay | Admitting: Internal Medicine

## 2016-01-04 DIAGNOSIS — Z79899 Other long term (current) drug therapy: Secondary | ICD-10-CM | POA: Diagnosis not present

## 2016-01-04 DIAGNOSIS — Z86711 Personal history of pulmonary embolism: Secondary | ICD-10-CM | POA: Insufficient documentation

## 2016-01-04 DIAGNOSIS — Z7901 Long term (current) use of anticoagulants: Secondary | ICD-10-CM | POA: Diagnosis not present

## 2016-01-04 DIAGNOSIS — O26891 Other specified pregnancy related conditions, first trimester: Secondary | ICD-10-CM | POA: Insufficient documentation

## 2016-01-04 DIAGNOSIS — D6859 Other primary thrombophilia: Secondary | ICD-10-CM | POA: Insufficient documentation

## 2016-01-04 NOTE — Progress Notes (Signed)
Currently Pregnant and suffering pregnancy related symptoms such as N/V.

## 2016-01-04 NOTE — Assessment & Plan Note (Addendum)
   [facility]; Dr.Staebler- ob

## 2016-01-04 NOTE — Assessment & Plan Note (Signed)
Prior history of PE [provoked-long car ride/BCPs]. In the context of her brothers new diagnosis of-protein S deficiency- I would recommend further evaluation of a primary hypercoagulable state.  # As the patient is currently pregnant- I would recommend checking prothrombin gene mutation and factor V Leiden at this time. She understands that results of the above workup- will not change the current management.  # I would recommend checking protein C and protein S; antithrombin III- 3-6 months post delivery.   # For now patient will continue Lovenox prophylaxis as per [facility] high-risk pregnancy team.   # Patient follow-up with me in one year- to discuss further testing as described above. Patient was asked to call us regarding results of the above workup.  Thank you Dr. Yetta BarreJones for allowing me to participate in the care of your pleasant patient. Please do not hesitate to contact me with questions or concerns in the interim.

## 2016-01-04 NOTE — Progress Notes (Signed)
[facility] CONSULT NOTE  Patient Care Team: Duanne Limerickeanna C Jones, MD as PCP - General (Family Medicine)  CHIEF COMPLAINTS/PURPOSE OF CONSULTATION:   #   # 2013 SEP- Pulmonary embolus [BCPs/long car trip]- xarelto x 6 M   No history exists.     HISTORY OF PRESENTING ILLNESS:  Michelle Hogan 30 y.o.  female prior history of PE likely provoked in September 2013- states that her brother who is younger to her noted to have a DVT/protein S deficiency.   Patient had been pregnant- 2016-she had been on lovenox 30 mg BID until 28 weeks;40mg  BID until; and 6-8 weeks post pregnancy. [facility]   Patient is currently [redacted] weeks pregnant- she is currently on Lovenox 30 mg twice a day.   No miscarriages. She never had any other thrombotic events. No problems noted on anticoagulation this time  ROS: A complete 10 point review of system is done which is negative except mentioned above in history of present illness  MEDICAL HISTORY:  Past Medical History:  Diagnosis Date  . Depression   . PE (pulmonary embolism)     SURGICAL HISTORY: Past Surgical History:  Procedure Laterality Date  . ANKLE SURGERY Left     SOCIAL HISTORY: no smoking; stay home mom; in [place]. Rare alcohol Social History   Social History  . Marital status: Married    Spouse name: N/A  . Number of children: N/A  . Years of education: N/A   Occupational History  . Not on file.   Social History Main Topics  . Smoking status: Never Smoker  . Smokeless tobacco: Never Used  . Alcohol use 0.6 oz/week    1 Cans of beer per week     Comment: rarely  . Drug use: No  . Sexual activity: Yes    Birth control/ protection: IUD   Other Topics Concern  . Not on file   Social History Narrative  . No narrative on file    FAMILY HISTORY: Family History  Problem Relation Age of Onset  . Hyperlipidemia Mother   . Depression Mother   . Heart attack Father   . Heart disease Father   .  Deep vein thrombosis Brother   . Diabetes Maternal Grandfather   . Bladder Cancer Maternal Grandfather     ALLERGIES:  has No Known Allergies.  MEDICATIONS:  Current Outpatient Prescriptions  Medication Sig Dispense Refill  . Cholecalciferol (VITAMIN D) 2000 units CAPS Take 1 capsule (2,000 Units total) by mouth daily. 30 capsule   . enoxaparin (LOVENOX) 30 MG/0.3ML injection Inject 0.3 mLs (30 mg total) into the skin every 12 (twelve) hours. 18 mL 6  . escitalopram (LEXAPRO) 10 MG tablet Take 10 mg by mouth daily.  3  . Melatonin 5 MG TABS Take 1 tablet (5 mg total) by mouth daily after supper. 30 tablet 0  . Prenatal Multivit-Min-Fe-FA (PRE-NATAL FORMULA) TABS Take by mouth.     No current facility-administered medications for this visit.       Marland Kitchen.  PHYSICAL EXAMINATION: ECOG PERFORMANCE STATUS: 0 - Asymptomatic  Vitals:   01/04/16 1142  BP: 103/72  Pulse: 98  Resp: 18  Temp: 97.5 F (36.4 C)   Filed Weights   01/04/16 1142  Weight: 227 lb 6.5 oz (103.1 kg)    GENERAL: Well-nourished well-developed; Alert, no distress and comfortable.   Alone.  EYES: no pallor or icterus OROPHARYNX: no thrush or ulceration; good dentition  NECK:  supple, no masses felt LYMPH:  no palpable lymphadenopathy in the cervical, axillary or inguinal regions LUNGS: clear to auscultation and  No wheeze or crackles HEART/CVS: regular rate & rhythm and no murmurs; No lower extremity edema ABDOMEN: abdomen soft, non-tender and normal bowel sounds Musculoskeletal:no cyanosis of digits and no clubbing  PSYCH: alert & oriented x 3 with fluent speech NEURO: no focal motor/sensory deficits SKIN:  no rashes or significant lesions  LABORATORY DATA:  I have reviewed the data as listed Lab Results  Component Value Date   WBC 4.9 07/13/2015   HGB 13.8 01/03/2015   HCT 40.8 07/13/2015   MCV 86 07/13/2015   PLT 250 07/13/2015    Recent Labs  07/13/15 1303  NA 139  K 4.0  CL 102  CO2 21   GLUCOSE 93  BUN 15  CREATININE 0.65  CALCIUM 9.5  GFRNONAA 120  GFRAA 138  PROT 7.1  ALBUMIN 5.0  AST 17  ALT 16  ALKPHOS 70  BILITOT 0.4    RADIOGRAPHIC STUDIES: I have personally reviewed the radiological images as listed and agreed with the findings in the report. No results found.  ASSESSMENT & PLAN:   Hx pulmonary embolism   [facility]; Dr.Staebler- ob  Primary hypercoagulable state (HCC) Prior history of PE [provoked-long car ride/BCPs]. In the context of her brothers new diagnosis of-protein S deficiency- I would recommend further evaluation of a primary hypercoagulable state.  # As the patient is currently pregnant- I would recommend checking prothrombin gene mutation and factor V Leiden at this time. She understands that results of the above workup- will not change the current management.  # I would recommend checking protein C and protein S; antithrombin III- 3-6 months post delivery.   # For now patient will continue Lovenox prophylaxis as per [facility] high-risk pregnancy team.   # Patient follow-up with me in one year- to discuss further testing as described above. Patient was asked to call us regarding results of the above workup.  Thank you Dr. Yetta Barre for allowing me to participate in the care of your pleasant patient. Please do not hesitate to contact me with questions or concerns in the interim.  All questions were answered. The patient knows to call the clinic with any problems, questions or concerns.     Earna Coder, MD 01/04/2016 2:21 PM

## 2016-01-10 LAB — FACTOR 5 LEIDEN

## 2016-01-10 LAB — PROTHROMBIN GENE MUTATION

## 2016-01-11 ENCOUNTER — Telehealth: Payer: Self-pay | Admitting: *Deleted

## 2016-01-11 NOTE — Telephone Encounter (Signed)
Notified patient that her results from genetic testing was neg. For blood clots.

## 2016-01-11 NOTE — Telephone Encounter (Signed)
-----   Message from Earna CoderGovinda R Brahmanday, MD sent at 01/10/2016  6:24 PM EDT ----- Please inform patient that her genetic testing for blood clots is negative. Follow-up as planned.

## 2016-05-08 LAB — OB RESULTS CONSOLE HIV ANTIBODY (ROUTINE TESTING)
HIV: NONREACTIVE
HIV: NONREACTIVE

## 2016-05-08 LAB — OB RESULTS CONSOLE HEPATITIS B SURFACE ANTIGEN
HEP B S AG: NEGATIVE
Hepatitis B Surface Ag: NEGATIVE

## 2016-05-08 LAB — OB RESULTS CONSOLE RPR: RPR: NONREACTIVE

## 2016-05-08 LAB — OB RESULTS CONSOLE VARICELLA ZOSTER ANTIBODY, IGG: Varicella: IMMUNE

## 2016-05-08 LAB — OB RESULTS CONSOLE RUBELLA ANTIBODY, IGM: Rubella: IMMUNE

## 2016-05-17 DIAGNOSIS — D539 Nutritional anemia, unspecified: Secondary | ICD-10-CM | POA: Insufficient documentation

## 2016-07-12 IMAGING — CT CT ANGIO CHEST
2 of 6 series · 18 of 36 positions shown · IV contrast (APPLIED)
Comparison: None.

CLINICAL DATA: Acute onset of generalized chest pain and upper
abdominal pain. Sensation of shortness of breath. Personal history
of pulmonary embolus. Initial encounter.

EXAM:
CT ANGIOGRAPHY CHEST WITH CONTRAST
TECHNIQUE: Multidetector CT imaging of the chest was performed using the
standard protocol during bolus administration of intravenous
contrast. Multiplanar CT image reconstructions and MIPs were
obtained to evaluate the vascular anatomy.
CONTRAST:  100 mL of Isovue 370 IV contrast

[Series 6: pe 1.0 thins · axial · 0.68mm/px · z∈[-736,-450]mm · 17 of 321 slices shown]
[im 18/321  lung]
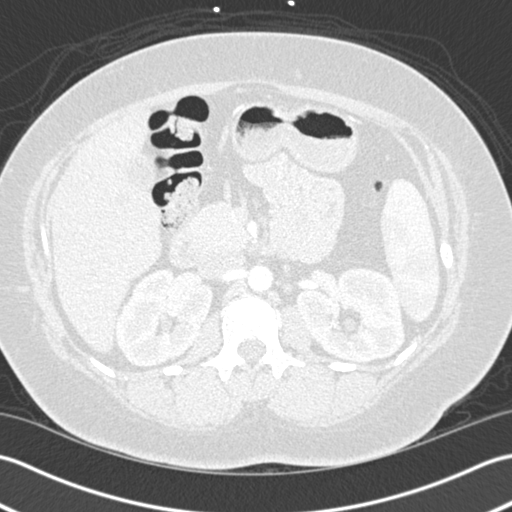
[im 36/321  mediastinal]
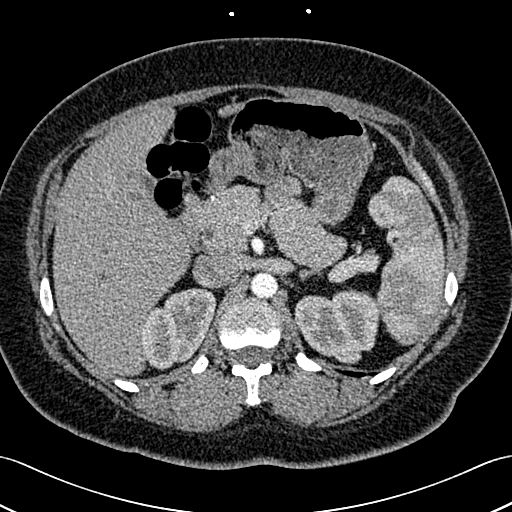
[im 54/321  lung]
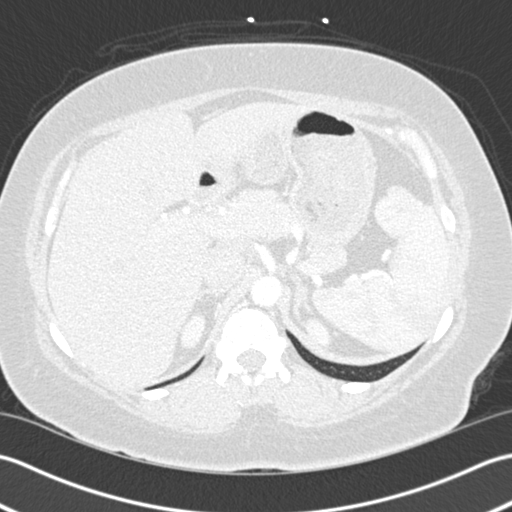
[im 72/321  mediastinal]
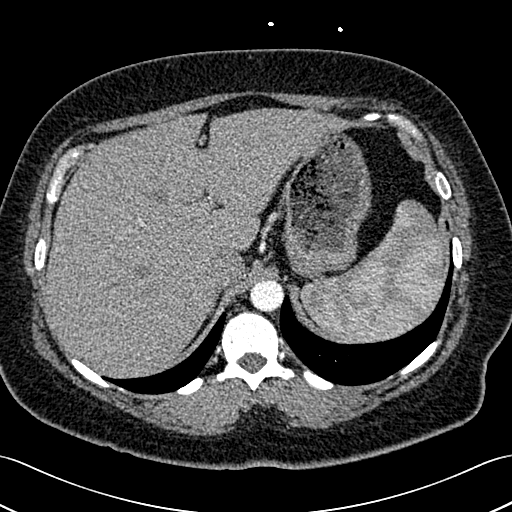
[im 89/321  lung]
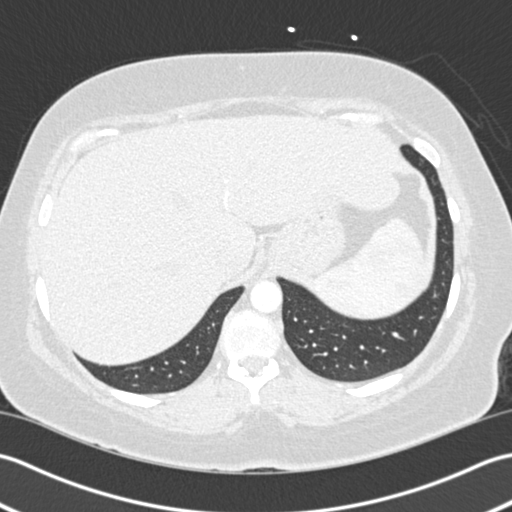
[im 107/321  mediastinal]
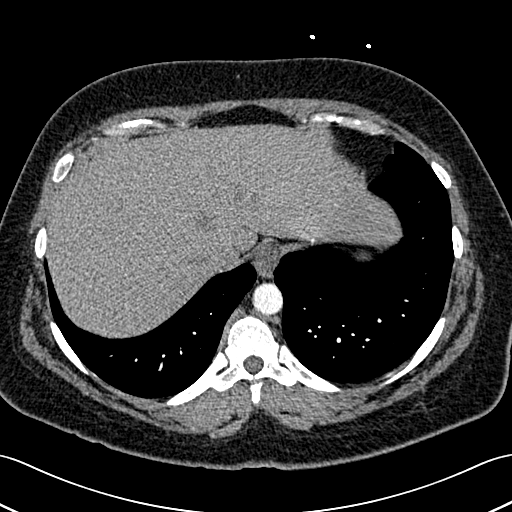
[im 125/321  lung]
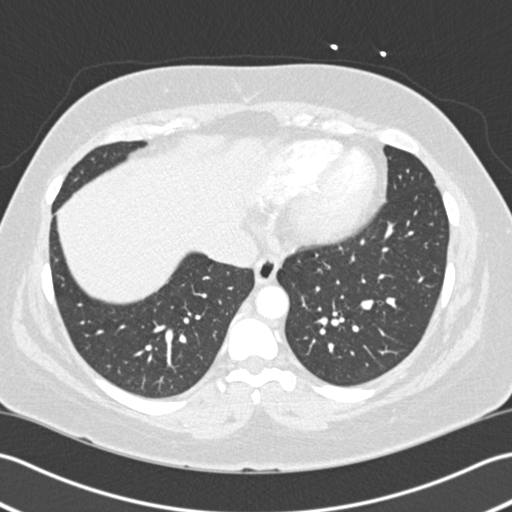
[im 143/321  mediastinal]
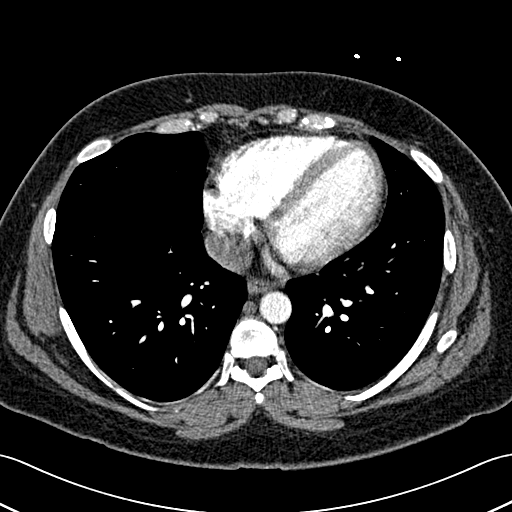
[im 161/321  lung]
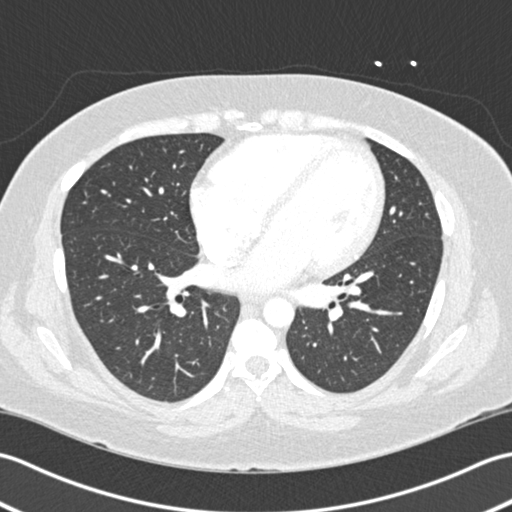
[im 178/321  mediastinal]
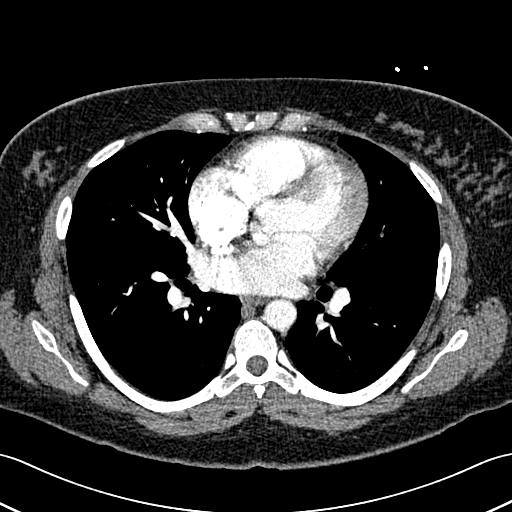
[im 196/321  lung]
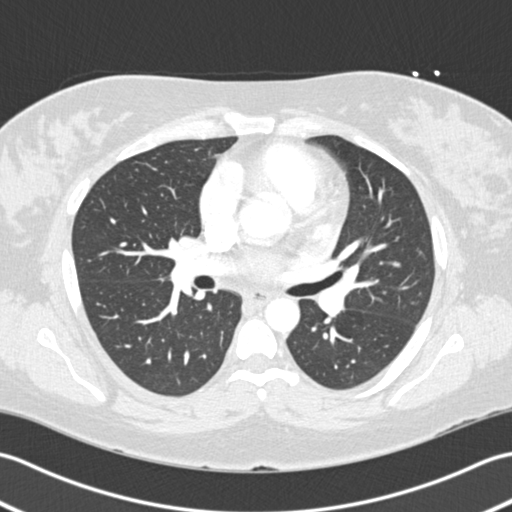
[im 214/321  mediastinal]
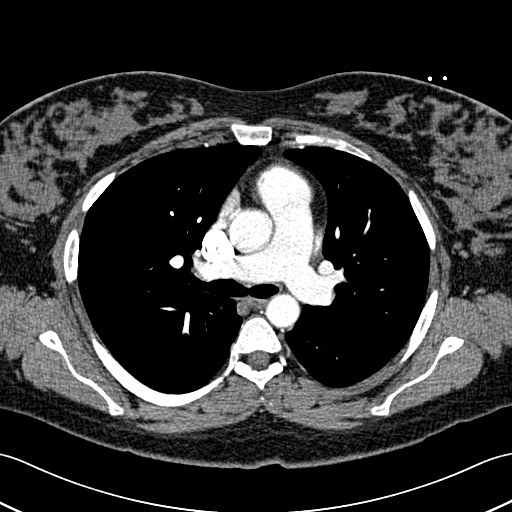
[im 232/321  lung]
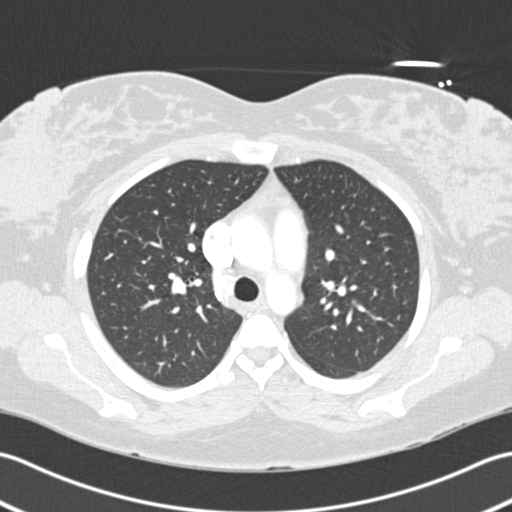
[im 249/321  mediastinal]
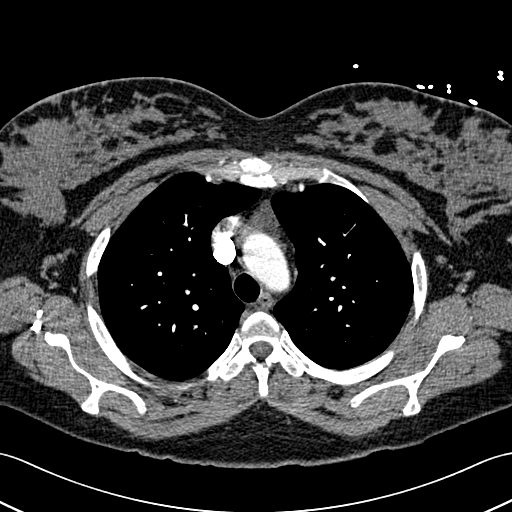
[im 267/321  lung]
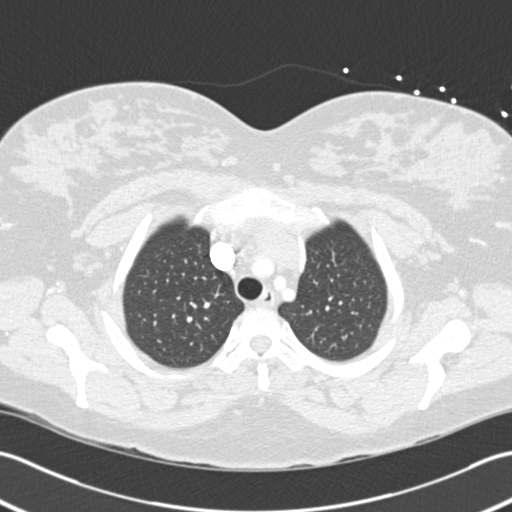
[im 285/321  mediastinal]
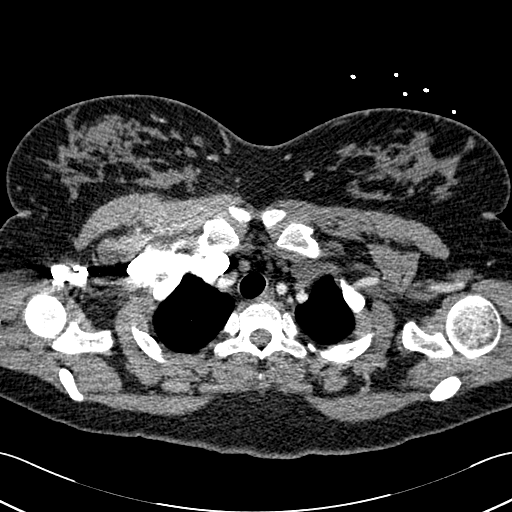
[im 303/321  lung]
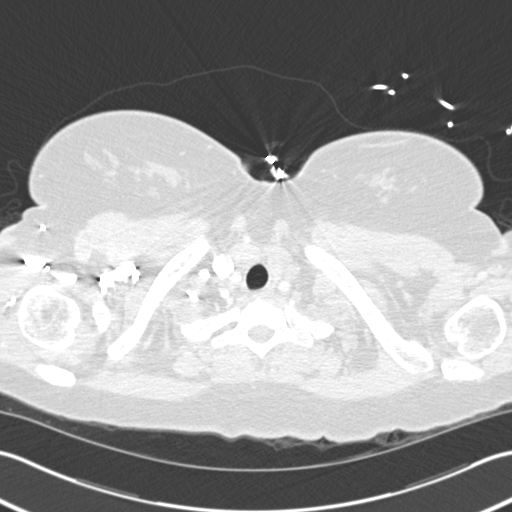

[Series 8: cor pe 2.0 mpr · coronal · 0.73mm/px · 1 of 161 slices shown]
[im 81/161  mediastinal]
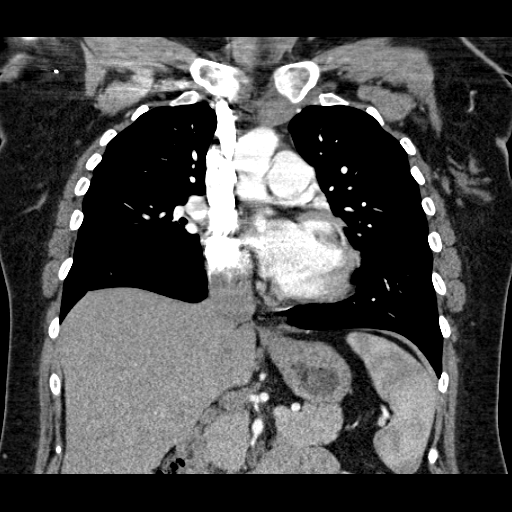

[18 of 36 positions shown; findings below may reference images not displayed]

FINDINGS: There is no evidence of pulmonary embolus.

The lungs are clear bilaterally. There is no evidence of significant
focal consolidation, pleural effusion or pneumothorax. No masses are
identified; no abnormal focal contrast enhancement is seen.

The mediastinum is unremarkable in appearance. No mediastinal
lymphadenopathy is seen. No pericardial effusion is identified. The
great vessels are grossly unremarkable in appearance. No axillary
lymphadenopathy is seen. The visualized portions of the thyroid
gland are unremarkable in appearance.

The visualized portions of the liver and spleen are unremarkable.
The visualized portions of the pancreas, gallbladder, stomach,
adrenal glands and kidneys are within normal limits.

No acute osseous abnormalities are seen.

Review of the MIP images confirms the above findings.
IMPRESSION: 1. No evidence of pulmonary embolus.
2. Lungs clear bilaterally.

## 2016-07-17 ENCOUNTER — Inpatient Hospital Stay: Payer: No Typology Code available for payment source | Admitting: Anesthesiology

## 2016-07-17 ENCOUNTER — Inpatient Hospital Stay
Admission: RE | Admit: 2016-07-17 | Discharge: 2016-07-19 | DRG: 765 | Disposition: A | Discharge status: Home / Self Care | Payer: No Typology Code available for payment source | Source: Ambulatory Visit | Attending: Obstetrics & Gynecology | Admitting: Obstetrics & Gynecology

## 2016-07-17 ENCOUNTER — Encounter
Admission: RE | Disposition: A | Discharge status: Home / Self Care | Payer: Self-pay | Source: Ambulatory Visit | Attending: Obstetrics & Gynecology

## 2016-07-17 DIAGNOSIS — O403XX0 Polyhydramnios, third trimester, not applicable or unspecified: Secondary | ICD-10-CM | POA: Diagnosis present

## 2016-07-17 DIAGNOSIS — Z86711 Personal history of pulmonary embolism: Secondary | ICD-10-CM | POA: Diagnosis not present

## 2016-07-17 DIAGNOSIS — O321XX0 Maternal care for breech presentation, not applicable or unspecified: Principal | ICD-10-CM | POA: Diagnosis present

## 2016-07-17 DIAGNOSIS — Z6837 Body mass index (BMI) 37.0-37.9, adult: Secondary | ICD-10-CM | POA: Diagnosis not present

## 2016-07-17 DIAGNOSIS — O3663X0 Maternal care for excessive fetal growth, third trimester, not applicable or unspecified: Secondary | ICD-10-CM | POA: Diagnosis present

## 2016-07-17 DIAGNOSIS — O99344 Other mental disorders complicating childbirth: Secondary | ICD-10-CM | POA: Diagnosis present

## 2016-07-17 DIAGNOSIS — F321 Major depressive disorder, single episode, moderate: Secondary | ICD-10-CM | POA: Diagnosis present

## 2016-07-17 DIAGNOSIS — Z7901 Long term (current) use of anticoagulants: Secondary | ICD-10-CM

## 2016-07-17 DIAGNOSIS — E669 Obesity, unspecified: Secondary | ICD-10-CM | POA: Diagnosis present

## 2016-07-17 DIAGNOSIS — O26893 Other specified pregnancy related conditions, third trimester: Secondary | ICD-10-CM | POA: Diagnosis present

## 2016-07-17 DIAGNOSIS — O99214 Obesity complicating childbirth: Secondary | ICD-10-CM | POA: Diagnosis present

## 2016-07-17 DIAGNOSIS — Z6791 Unspecified blood type, Rh negative: Secondary | ICD-10-CM

## 2016-07-17 DIAGNOSIS — Z3493 Encounter for supervision of normal pregnancy, unspecified, third trimester: Secondary | ICD-10-CM | POA: Diagnosis present

## 2016-07-17 DIAGNOSIS — Z3A37 37 weeks gestation of pregnancy: Secondary | ICD-10-CM | POA: Diagnosis not present

## 2016-07-17 LAB — CBC
HEMATOCRIT: 36.7 % (ref 35.0–47.0)
Hemoglobin: 13 g/dL (ref 12.0–16.0)
MCH: 30.7 pg (ref 26.0–34.0)
MCHC: 35.5 g/dL (ref 32.0–36.0)
MCV: 86.4 fL (ref 80.0–100.0)
Platelets: 216 10*3/uL (ref 150–440)
RBC: 4.24 MIL/uL (ref 3.80–5.20)
RDW: 14.7 % — AB (ref 11.5–14.5)
WBC: 8 10*3/uL (ref 3.6–11.0)

## 2016-07-17 LAB — TYPE AND SCREEN
ABO/RH(D): A NEG
Antibody Screen: NEGATIVE

## 2016-07-17 SURGERY — Surgical Case
Anesthesia: Epidural | Site: Abdomen | Wound class: Clean Contaminated

## 2016-07-17 MED ORDER — MORPHINE SULFATE (PF) 0.5 MG/ML IJ SOLN
INTRAMUSCULAR | Status: DC | PRN
  Administered 2016-07-17 (×2): 2 mg via EPIDURAL

## 2016-07-17 MED ORDER — SODIUM CHLORIDE 0.9% FLUSH: 3.0000 mL | INTRAVENOUS | Status: DC | PRN

## 2016-07-17 MED ORDER — OXYTOCIN 40 UNITS IN LACTATED RINGERS INFUSION - SIMPLE MED
INTRAVENOUS | Status: DC | PRN
  Administered 2016-07-17: 500 mL via INTRAVENOUS

## 2016-07-17 MED ORDER — LIDOCAINE HCL (PF) 2 % IJ SOLN
INTRAMUSCULAR | Status: DC | PRN
  Administered 2016-07-17 (×3): 100 mg via EPIDURAL

## 2016-07-17 MED ORDER — LACTATED RINGERS IV SOLN
500.0000 mL | INTRAVENOUS | Status: DC | PRN
  Administered 2016-07-17: 700 mL via INTRAVENOUS
  Administered 2016-07-17: 1000 mL via INTRAVENOUS

## 2016-07-17 MED ORDER — EPHEDRINE SULFATE 50 MG/ML IJ SOLN
INTRAMUSCULAR | Status: DC | PRN
  Administered 2016-07-17 (×2): 10 mg via INTRAVENOUS

## 2016-07-17 MED ORDER — BUPIVACAINE LIPOSOME 1.3 % IJ SUSP
20.0000 mL | Freq: Once | INTRAMUSCULAR | Status: DC
  Filled 2016-07-17: qty 20

## 2016-07-17 MED ORDER — BUPIVACAINE LIPOSOME 1.3 % IJ SUSP
INTRAMUSCULAR | Status: DC | PRN
  Administered 2016-07-17: 50 mL

## 2016-07-17 MED ORDER — TERBUTALINE SULFATE 1 MG/ML IJ SOLN
INTRAMUSCULAR | Status: AC
  Administered 2016-07-17: 0.25 mg via SUBCUTANEOUS
  Filled 2016-07-17: qty 1

## 2016-07-17 MED ORDER — OXYTOCIN 40 UNITS IN LACTATED RINGERS INFUSION - SIMPLE MED
INTRAVENOUS | Status: AC
  Filled 2016-07-17: qty 1000

## 2016-07-17 MED ORDER — OXYCODONE HCL 5 MG PO TABS
10.0000 mg | ORAL_TABLET | ORAL | Status: DC | PRN
  Filled 2016-07-17: qty 2

## 2016-07-17 MED ORDER — IBUPROFEN 600 MG PO TABS
600.0000 mg | ORAL_TABLET | Freq: Four times a day (QID) | ORAL | Status: DC
  Administered 2016-07-18 – 2016-07-19 (×5): 600 mg via ORAL
  Filled 2016-07-17 (×5): qty 1

## 2016-07-17 MED ORDER — DIPHENHYDRAMINE HCL 25 MG PO CAPS: 25.0000 mg | ORAL_CAPSULE | ORAL | Status: DC | PRN

## 2016-07-17 MED ORDER — OXYCODONE HCL 5 MG PO TABS: 5.0000 mg | ORAL_TABLET | Freq: Four times a day (QID) | ORAL | Status: AC | PRN

## 2016-07-17 MED ORDER — DIBUCAINE 1 % RE OINT: 1.0000 "application " | TOPICAL_OINTMENT | RECTAL | Status: DC | PRN

## 2016-07-17 MED ORDER — ACETAMINOPHEN 325 MG PO TABS: 650.0000 mg | ORAL_TABLET | ORAL | Status: DC | PRN

## 2016-07-17 MED ORDER — OXYTOCIN 40 UNITS IN LACTATED RINGERS INFUSION - SIMPLE MED
2.5000 [IU]/h | INTRAVENOUS | Status: DC
  Filled 2016-07-17: qty 1000

## 2016-07-17 MED ORDER — SODIUM CHLORIDE 0.9 % IV SOLN: 250.0000 mL | INTRAVENOUS | Status: DC

## 2016-07-17 MED ORDER — ONDANSETRON HCL 4 MG/2ML IJ SOLN: 4.0000 mg | Freq: Three times a day (TID) | INTRAMUSCULAR | Status: DC | PRN

## 2016-07-17 MED ORDER — SODIUM CHLORIDE 0.9 % IJ SOLN
INTRAMUSCULAR | Status: AC
  Filled 2016-07-17: qty 50

## 2016-07-17 MED ORDER — ENOXAPARIN SODIUM 40 MG/0.4ML ~~LOC~~ SOLN
40.0000 mg | SUBCUTANEOUS | Status: DC
  Administered 2016-07-18: 40 mg via SUBCUTANEOUS
  Filled 2016-07-17: qty 0.4

## 2016-07-17 MED ORDER — PRENATAL MULTIVITAMIN CH
1.0000 | ORAL_TABLET | Freq: Every day | ORAL | Status: DC
  Administered 2016-07-18 – 2016-07-19 (×2): 1 via ORAL
  Filled 2016-07-17 (×2): qty 1

## 2016-07-17 MED ORDER — SENNOSIDES-DOCUSATE SODIUM 8.6-50 MG PO TABS: 2.0000 | ORAL_TABLET | ORAL | Status: DC

## 2016-07-17 MED ORDER — ONDANSETRON HCL 4 MG/2ML IJ SOLN: 4.0000 mg | Freq: Four times a day (QID) | INTRAMUSCULAR | Status: DC | PRN

## 2016-07-17 MED ORDER — SODIUM CHLORIDE 0.9 % IJ SOLN
INTRAMUSCULAR | Status: DC | PRN
  Administered 2016-07-17: 50 mL via INTRAVENOUS

## 2016-07-17 MED ORDER — OXYCODONE-ACETAMINOPHEN 5-325 MG PO TABS
2.0000 | ORAL_TABLET | ORAL | Status: DC | PRN
Start: 2016-07-17 — End: 2016-07-17

## 2016-07-17 MED ORDER — BUPIVACAINE HCL (PF) 0.5 % IJ SOLN: 30.0000 mL | Freq: Once | INTRAMUSCULAR | Status: DC

## 2016-07-17 MED ORDER — BUPIVACAINE IN DEXTROSE 0.75-8.25 % IT SOLN
INTRATHECAL | Status: DC | PRN
  Administered 2016-07-17: 1.6 mL via INTRATHECAL

## 2016-07-17 MED ORDER — LIDOCAINE 2% (20 MG/ML) 5 ML SYRINGE: INTRAMUSCULAR | Status: DC | PRN

## 2016-07-17 MED ORDER — FENTANYL CITRATE (PF) 100 MCG/2ML IJ SOLN
INTRAMUSCULAR | Status: AC
  Administered 2016-07-17: 50 ug via INTRAVENOUS
  Filled 2016-07-17: qty 2

## 2016-07-17 MED ORDER — SIMETHICONE 80 MG PO CHEW
160.0000 mg | CHEWABLE_TABLET | Freq: Four times a day (QID) | ORAL | Status: DC | PRN
Start: 2016-07-17 — End: 2016-07-19
  Administered 2016-07-18: 160 mg via ORAL
  Filled 2016-07-17 (×2): qty 2

## 2016-07-17 MED ORDER — OXYTOCIN 40 UNITS IN LACTATED RINGERS INFUSION - SIMPLE MED
2.5000 [IU]/h | INTRAVENOUS | Status: AC
  Administered 2016-07-17: 2.5 [IU]/h via INTRAVENOUS
  Filled 2016-07-17: qty 1000

## 2016-07-17 MED ORDER — LIDOCAINE-EPINEPHRINE (PF) 1.5 %-1:200000 IJ SOLN
INTRAMUSCULAR | Status: DC | PRN
  Administered 2016-07-17: 3 mL

## 2016-07-17 MED ORDER — CEFAZOLIN SODIUM 1 G IJ SOLR
INTRAMUSCULAR | Status: DC | PRN
  Administered 2016-07-17: 2 g

## 2016-07-17 MED ORDER — ACETAMINOPHEN 500 MG PO TABS
1000.0000 mg | ORAL_TABLET | Freq: Four times a day (QID) | ORAL | Status: DC
  Administered 2016-07-17 – 2016-07-19 (×7): 1000 mg via ORAL
  Filled 2016-07-17 (×7): qty 2

## 2016-07-17 MED ORDER — NALBUPHINE HCL 10 MG/ML IJ SOLN: 5.0000 mg | INTRAMUSCULAR | Status: DC | PRN

## 2016-07-17 MED ORDER — BUPIVACAINE HCL (PF) 0.5 % IJ SOLN
INTRAMUSCULAR | Status: AC
  Filled 2016-07-17: qty 30

## 2016-07-17 MED ORDER — MENTHOL 3 MG MT LOZG
1.0000 | LOZENGE | OROMUCOSAL | Status: DC | PRN
  Filled 2016-07-17: qty 9

## 2016-07-17 MED ORDER — OXYCODONE HCL 5 MG PO TABS
5.0000 mg | ORAL_TABLET | Freq: Once | ORAL | Status: AC | PRN
  Administered 2016-07-17: 5 mg via ORAL
  Filled 2016-07-17: qty 1

## 2016-07-17 MED ORDER — LIDOCAINE HCL (PF) 1 % IJ SOLN: 30.0000 mL | INTRAMUSCULAR | Status: DC | PRN

## 2016-07-17 MED ORDER — DIPHENHYDRAMINE HCL 50 MG/ML IJ SOLN: 12.5000 mg | INTRAMUSCULAR | Status: DC | PRN

## 2016-07-17 MED ORDER — LIDOCAINE HCL (PF) 1 % IJ SOLN
INTRAMUSCULAR | Status: DC | PRN
  Administered 2016-07-17: 2 mL via SUBCUTANEOUS

## 2016-07-17 MED ORDER — KETOROLAC TROMETHAMINE 30 MG/ML IJ SOLN
30.0000 mg | Freq: Four times a day (QID) | INTRAMUSCULAR | Status: AC | PRN
  Administered 2016-07-17 – 2016-07-18 (×2): 30 mg via INTRAVENOUS
  Filled 2016-07-17 (×2): qty 1

## 2016-07-17 MED ORDER — OXYTOCIN BOLUS FROM INFUSION: 500.0000 mL | Freq: Once | INTRAVENOUS | Status: DC

## 2016-07-17 MED ORDER — NALBUPHINE HCL 10 MG/ML IJ SOLN: 5.0000 mg | Freq: Once | INTRAMUSCULAR | Status: DC | PRN

## 2016-07-17 MED ORDER — SODIUM CHLORIDE 0.9% FLUSH: 3.0000 mL | Freq: Two times a day (BID) | INTRAVENOUS | Status: DC

## 2016-07-17 MED ORDER — DIPHENHYDRAMINE HCL 25 MG PO CAPS: 25.0000 mg | ORAL_CAPSULE | Freq: Four times a day (QID) | ORAL | Status: DC | PRN

## 2016-07-17 MED ORDER — LIDOCAINE HCL (PF) 2 % IJ SOLN
INTRAMUSCULAR | Status: AC
  Filled 2016-07-17: qty 6

## 2016-07-17 MED ORDER — NALOXONE HCL 0.4 MG/ML IJ SOLN: 0.4000 mg | INTRAMUSCULAR | Status: DC | PRN

## 2016-07-17 MED ORDER — FENTANYL CITRATE (PF) 100 MCG/2ML IJ SOLN
INTRAMUSCULAR | Status: DC | PRN
  Administered 2016-07-17: 15 ug via INTRATHECAL

## 2016-07-17 MED ORDER — COCONUT OIL OIL
1.0000 | TOPICAL_OIL | Status: DC | PRN
Start: 2016-07-17 — End: 2016-07-19
  Administered 2016-07-18: 1 via TOPICAL
  Filled 2016-07-17: qty 120

## 2016-07-17 MED ORDER — OXYCODONE HCL 5 MG PO TABS
5.0000 mg | ORAL_TABLET | ORAL | Status: DC | PRN
  Administered 2016-07-18: 5 mg via ORAL

## 2016-07-17 MED ORDER — SOD CITRATE-CITRIC ACID 500-334 MG/5ML PO SOLN
30.0000 mL | ORAL | Status: DC | PRN
  Administered 2016-07-17: 30 mL via ORAL
  Filled 2016-07-17: qty 15

## 2016-07-17 MED ORDER — LACTATED RINGERS IV SOLN
INTRAVENOUS | Status: DC
  Administered 2016-07-17 (×2): via INTRAVENOUS

## 2016-07-17 MED ORDER — TERBUTALINE SULFATE 1 MG/ML IJ SOLN
0.2500 mg | Freq: Once | INTRAMUSCULAR | Status: AC
  Administered 2016-07-17: 0.25 mg via SUBCUTANEOUS

## 2016-07-17 MED ORDER — FENTANYL CITRATE (PF) 100 MCG/2ML IJ SOLN
25.0000 ug | INTRAMUSCULAR | Status: DC | PRN
  Administered 2016-07-17 (×4): 50 ug via INTRAVENOUS

## 2016-07-17 MED ORDER — NALOXONE HCL 2 MG/2ML IJ SOSY
1.0000 ug/kg/h | PREFILLED_SYRINGE | INTRAVENOUS | Status: DC | PRN
  Filled 2016-07-17: qty 2

## 2016-07-17 MED ORDER — MORPHINE SULFATE (PF) 0.5 MG/ML IJ SOLN
INTRAMUSCULAR | Status: AC
  Filled 2016-07-17: qty 10

## 2016-07-17 MED ORDER — LIDOCAINE-EPINEPHRINE (PF) 2 %-1:200000 IJ SOLN: INTRAMUSCULAR | Status: DC | PRN

## 2016-07-17 MED ORDER — KETOROLAC TROMETHAMINE 30 MG/ML IJ SOLN: 30.0000 mg | Freq: Four times a day (QID) | INTRAMUSCULAR | Status: AC | PRN

## 2016-07-17 MED ORDER — WITCH HAZEL-GLYCERIN EX PADS: 1.0000 "application " | MEDICATED_PAD | CUTANEOUS | Status: DC | PRN

## 2016-07-17 MED ORDER — ACETAMINOPHEN 500 MG PO TABS: 1000.0000 mg | ORAL_TABLET | Freq: Four times a day (QID) | ORAL | Status: DC

## 2016-07-17 MED ORDER — PROPOFOL 10 MG/ML IV BOLUS
INTRAVENOUS | Status: AC
  Filled 2016-07-17: qty 20

## 2016-07-17 MED ORDER — OXYCODONE HCL 5 MG/5ML PO SOLN: 5.0000 mg | Freq: Once | ORAL | Status: AC | PRN

## 2016-07-17 MED ORDER — LACTATED RINGERS IV SOLN: INTRAVENOUS | Status: DC

## 2016-07-17 MED ORDER — FENTANYL CITRATE (PF) 100 MCG/2ML IJ SOLN
INTRAMUSCULAR | Status: AC
  Filled 2016-07-17: qty 2

## 2016-07-17 SURGICAL SUPPLY — 33 items
CANISTER SUCT 3000ML (MISCELLANEOUS) ×3 IMPLANT
CATH KIT ON-Q SILVERSOAK 5IN (CATHETERS) IMPLANT
CLOSURE WOUND 1/2 X4 (GAUZE/BANDAGES/DRESSINGS)
DRSG TELFA 3X8 NADH (GAUZE/BANDAGES/DRESSINGS) IMPLANT
ELECT CAUTERY BLADE 6.4 (BLADE) ×3 IMPLANT
ELECT REM PT RETURN 9FT ADLT (ELECTROSURGICAL) ×3
ELECTRODE REM PT RTRN 9FT ADLT (ELECTROSURGICAL) ×1 IMPLANT
GAUZE SPONGE 4X4 12PLY STRL (GAUZE/BANDAGES/DRESSINGS) IMPLANT
GLOVE PI ORTHOPRO 6.5 (GLOVE) ×2
GLOVE PI ORTHOPRO STRL 6.5 (GLOVE) ×1 IMPLANT
GLOVE SURG SYN 6.5 ES PF (GLOVE) ×3 IMPLANT
GOWN STRL REUS W/ TWL LRG LVL3 (GOWN DISPOSABLE) ×3 IMPLANT
GOWN STRL REUS W/TWL LRG LVL3 (GOWN DISPOSABLE) ×6
LIQUID BAND (GAUZE/BANDAGES/DRESSINGS) ×6 IMPLANT
NEEDLE HYPO 22GX1.5 SAFETY (NEEDLE) ×3 IMPLANT
NS IRRIG 1000ML POUR BTL (IV SOLUTION) ×3 IMPLANT
PACK C SECTION AR (MISCELLANEOUS) ×3 IMPLANT
PAD OB MATERNITY 4.3X12.25 (PERSONAL CARE ITEMS) ×3 IMPLANT
PAD PREP 24X41 OB/GYN DISP (PERSONAL CARE ITEMS) ×3 IMPLANT
STAPLER INSORB 30 2030 C-SECTI (MISCELLANEOUS) ×3 IMPLANT
STRAP SAFETY BODY (MISCELLANEOUS) ×3 IMPLANT
STRIP CLOSURE SKIN 1/2X4 (GAUZE/BANDAGES/DRESSINGS) IMPLANT
SUT MNCRL 4-0 (SUTURE) ×2
SUT MNCRL 4-0 27XMFL (SUTURE) ×1
SUT PDS AB 1 TP1 96 (SUTURE) ×3 IMPLANT
SUT VIC AB 0 CT1 36 (SUTURE) ×6 IMPLANT
SUT VIC AB 2-0 CT1 27 (SUTURE) ×2
SUT VIC AB 2-0 CT1 TAPERPNT 27 (SUTURE) ×1 IMPLANT
SUT VIC AB 3-0 SH 27 (SUTURE) ×2
SUT VIC AB 3-0 SH 27X BRD (SUTURE) ×1 IMPLANT
SUTURE MNCRL 4-0 27XMF (SUTURE) ×1 IMPLANT
SWABSTK COMLB BENZOIN TINCTURE (MISCELLANEOUS) IMPLANT
SYR 30ML LL (SYRINGE) ×6 IMPLANT

## 2016-07-17 NOTE — OB Triage Note (Signed)
[name] here for ECV and possible IOL or C/S. Pt reports positive fetal movement, denies bleeding, last meal 07/16/16.

## 2016-07-17 NOTE — Anesthesia Procedure Notes (Signed)
Epidural

## 2016-07-17 NOTE — Progress Notes (Signed)
Chaplain responded to an OR for AD for a Patient in [id number]. CH met the Pt and her husband and parents at the bedside. [place] educated the Pt on how to fill out the AD, and left it with the Pt to go over it. Few minutes later, a nurse paged the [facility] to inform that the Pt was ready to complete the AD. [place] secured a Land and 2 witnesses and had the Pt complete the AD. CH gave the original AD and 2 copies to the Pt, and placed a copy in the Pt's medical file.    07/17/16 1300  Clinical Encounter Type  Visited With Patient;Patient and family together  Visit Type Initial;ED  Referral From Nurse  Consult/Referral To Chaplain  Spiritual Encounters  Spiritual Needs Literature;Brochure  Stress Factors  Patient Stress Factors None identified  Family Stress Factors None identified  Advance Directives (For Healthcare)  Does Patient Have a Medical Advance Directive? Yes  Does patient want to make changes to medical advance directive? Yes (Inpatient - patient requests chaplain consult to change a medical advance directive)  Type of Advance Directive [place];Living will  Copy of [place] in Chart? Yes  Copy of Living Will in Chart? Yes  Would patient like information on creating a medical advance directive? Yes (Inpatient - patient requests chaplain consult to create a medical advance directive)  [place] Directives  Does Patient Have a Mental Health Advance Directive? No  Would patient like information on creating a mental health advance directive? No - Patient declined

## 2016-07-17 NOTE — Anesthesia Procedure Notes (Signed)
Epidural Patient location during procedure: OB Start time: 07/17/2016 4:11 PM End time: 07/17/2016 4:14 PM  Staffing Performed: anesthesiologist   Preanesthetic Checklist Completed: patient identified, site marked, surgical consent, pre-op evaluation, timeout performed, IV checked, risks and benefits discussed and monitors and equipment checked  Epidural Patient position: sitting Prep: Betadine Patient monitoring: heart rate, continuous pulse ox and blood pressure Approach: midline Location: L3-L4 Injection technique: LOR saline  Needle:  Needle type: Tuohy  Needle gauge: 18 G Needle length: 9 cm and 9 Needle insertion depth: 7 cm Catheter type: closed end flexible Catheter size: 20 Guage Catheter at skin depth: 11 cm Test dose: negative and 1.5% lidocaine with Epi 1:200 K  Assessment Sensory level: T3 Events: blood not aspirated, injection not painful, no injection resistance, negative IV test and no paresthesia  Additional Notes CSE.  25 G [place] Spinal needle passed though epidural needle  Patient tolerated the insertion well without complications.-SATD -IVTD. No paresthesia. [facility] for VS and dosingReason for block:procedure for pain

## 2016-07-17 NOTE — Anesthesia Preprocedure Evaluation (Signed)
Anesthesia Evaluation  Patient identified by MRN, date of birth, ID band Patient awake    Reviewed: Allergy & Precautions, H&P , NPO status , Patient's Chart, lab work & pertinent test results  History of Anesthesia Complications Negative for: history of anesthetic complications  Airway Mallampati: III  TM Distance: >3 FB Neck ROM: full    Dental  (+) Poor Dentition   Pulmonary neg pulmonary ROS, neg shortness of breath,    Pulmonary exam normal breath sounds clear to auscultation       Cardiovascular Exercise Tolerance: Good (-) hypertension(-) angina(-) Past MI negative cardio ROS Normal cardiovascular exam Rhythm:regular Rate:Normal     Neuro/Psych PSYCHIATRIC DISORDERS Depression    GI/Hepatic negative GI ROS,   Endo/Other    Renal/GU   negative genitourinary   Musculoskeletal   Abdominal   Peds  Hematology negative hematology ROS (+)   Anesthesia Other Findings Past Medical History: No date: Depression No date: PE (pulmonary embolism)  Past Surgical History: No date: ANKLE SURGERY Left  BMI    Body Mass Index:  38.94 kg/m      Reproductive/Obstetrics (+) Pregnancy                             Anesthesia Physical Anesthesia Plan  ASA: III  Anesthesia Plan: Spinal and Epidural   Post-op Pain Management:    Induction:   Airway Management Planned:   Additional Equipment:   Intra-op Plan:   Post-operative Plan:   Informed Consent: I have reviewed the patients History and Physical, chart, labs and discussed the procedure including the risks, benefits and alternatives for the proposed anesthesia with the patient or authorized representative who has indicated his/her understanding and acceptance.   Dental Advisory Given  Plan Discussed with: Anesthesiologist  Anesthesia Plan Comments: (Patient reports last lovenox on 07/15/16 Plan for CSE)        Anesthesia  Quick Evaluation

## 2016-07-17 NOTE — Transfer of Care (Signed)
Immediate Anesthesia Transfer of Care Note  Patient: Michelle Hogan  Procedure(s) Performed: Procedure(s): C-Section (N/A)  Patient Location: PACU  Anesthesia Type:Spinal and Epidural  Level of Consciousness: awake, alert  and oriented  Airway & Oxygen Therapy: Patient Spontanous Breathing  Post-op Assessment: Post -op Vital signs reviewed and stable  Post vital signs: stable  Last Vitals:  Vitals:   07/17/16 1130 07/17/16 1820  BP: 110/67 (!) 104/44  Pulse: 75 80  Resp:  19  Temp:  36.4 C    Last Pain:  Vitals:   07/17/16 1820  TempSrc: Oral  PainSc:          Complications: No apparent anesthesia complications

## 2016-07-17 NOTE — Anesthesia Post-op Follow-up Note (Cosign Needed)
Anesthesia QCDR form completed.        

## 2016-07-18 ENCOUNTER — Encounter: Payer: Self-pay | Admitting: Obstetrics & Gynecology

## 2016-07-18 LAB — CBC
HCT: 30.3 % — ABNORMAL LOW (ref 35.0–47.0)
HEMOGLOBIN: 10.7 g/dL — AB (ref 12.0–16.0)
MCH: 30.8 pg (ref 26.0–34.0)
MCHC: 35.3 g/dL (ref 32.0–36.0)
MCV: 87.3 fL (ref 80.0–100.0)
Platelets: 168 10*3/uL (ref 150–440)
RBC: 3.47 MIL/uL — AB (ref 3.80–5.20)
RDW: 14.3 % (ref 11.5–14.5)
WBC: 8.4 10*3/uL (ref 3.6–11.0)

## 2016-07-18 LAB — FETAL SCREEN: FETAL SCREEN: NEGATIVE

## 2016-07-18 LAB — RPR: RPR Ser Ql: NONREACTIVE

## 2016-07-18 MED ORDER — RHO D IMMUNE GLOBULIN 1500 UNIT/2ML IJ SOSY
300.0000 ug | PREFILLED_SYRINGE | Freq: Once | INTRAMUSCULAR | Status: AC
  Administered 2016-07-18: 300 ug via INTRAVENOUS
  Filled 2016-07-18: qty 2

## 2016-07-18 NOTE — Progress Notes (Signed)
Subjective: Postpartum Day 1 Cesarean Delivery POD#1 Patient reports failed Elective version failed and lTC S was done   Objective: Vital signs in last 24 hours: Temp:  [97.4 F (36.3 C)-98.7 F (37.1 C)] 97.9 F (36.6 C) (03/27 0841) Pulse Rate:  [59-91] 74 (03/27 0841) Resp:  [12-24] 18 (03/27 0841) BP: (100-129)/(44-79) 107/68 (03/27 0841) SpO2:  [98 %-100 %] 99 % (03/27 0841) Weight:  [106.1 kg (234 lb)] 106.1 kg (234 lb) (03/26 1011)  Physical Exam:  General: alert, cooperative and appears stated age  Heart: S1S2, RRR, No M/R?G. Lungs: CTA bilat, no W/R/R. Lochia: appropriate Uterine Fundus: firm Incision: healing well, no significant drainage DVT Evaluation: No evidence of DVT seen on physical exam.   Recent Labs  07/17/16 1026 07/18/16 0413  HGB 13.0 10.7*  HCT 36.7 30.3*    Assessment/Plan: Status post Cesarean section. Doing well postoperatively.  Continue current care.  Sharee PimpleCaron W Jones 07/18/2016, 9:35 AM

## 2016-07-18 NOTE — Anesthesia Postprocedure Evaluation (Signed)
Anesthesia Post Note  Patient: Lenox Pondsmanda D Simcoe  Procedure(s) Performed: Procedure(s) (LRB): C-Section (N/A)  Patient location during evaluation: Mother Baby Anesthesia Type: Spinal Level of consciousness: awake, awake and alert and oriented Pain management: pain level controlled Vital Signs Assessment: post-procedure vital signs reviewed and stable Respiratory status: spontaneous breathing and nonlabored ventilation Cardiovascular status: stable Postop Assessment: no headache and no backache Anesthetic complications: no     Last Vitals:  Vitals:   07/18/16 0110 07/18/16 0334  BP: (!) 110/55 (!) 112/56  Pulse:    Resp: 20 18  Temp: 37.1 C 37.1 C    Last Pain:  Vitals:   07/18/16 0619  TempSrc:   PainSc: 4                  Jansen Sciuto,  [place] R

## 2016-07-18 NOTE — Anesthesia Post-op Follow-up Note (Signed)
  Anesthesia Pain Follow-up Note  Patient: Michelle Hogan  Day #: 1  Date of Follow-up: 07/18/2016 Time: 8:04 AM  Last Vitals:  Vitals:   07/18/16 0110 07/18/16 0334  BP: (!) 110/55 (!) 112/56  Pulse:    Resp: 20 18  Temp: 37.1 C 37.1 C    Level of Consciousness: alert  Pain: none   Side Effects:None  Catheter Site Exam:clean, dry, no drainage  Anti-Coag Meds    Start     Dose/Rate Route Frequency Ordered Stop   07/18/16 1800  enoxaparin (LOVENOX) injection 40 mg     40 mg Subcutaneous Every 24 hours 07/17/16 2215         Plan: Continue current therapy of postop epidural at surgeon's request  Annette Liotta,  Sheran FavaMark R

## 2016-07-18 NOTE — Progress Notes (Signed)
RN (bWynetta Emery) in room to hang a new bag of fluid; pt asking questions regarding pumping; at this time her baby is in SCN (baby may have to stay but may come to room); pt would like to start pumping ("to jump start milk production and start stimulating breasts"); RN got pump and pump kit, put pieces together and assisted pt starting to pump; about 5 min into pumping, the baby was brought to the room and pt ready to try breastfeeding; baby did latch to left breast

## 2016-07-19 LAB — RHOGAM INJECTION: Unit division: 0

## 2016-07-19 MED ORDER — IBUPROFEN 600 MG PO TABS: 600.0000 mg | ORAL_TABLET | Freq: Four times a day (QID) | ORAL | 0 refills | Status: DC

## 2016-07-19 MED ORDER — HYDROCODONE-ACETAMINOPHEN 5-325 MG PO TABS: 1.0000 | ORAL_TABLET | ORAL | 0 refills | Status: DC | PRN

## 2016-07-19 MED ORDER — HYDROCODONE-ACETAMINOPHEN 5-325 MG PO TABS: 1.0000 | ORAL_TABLET | ORAL | Status: DC | PRN

## 2016-07-19 MED ORDER — ENOXAPARIN SODIUM 40 MG/0.4ML ~~LOC~~ SOLN: 40.0000 mg | Freq: Two times a day (BID) | SUBCUTANEOUS | 1 refills | Status: DC

## 2016-07-19 MED ORDER — DOCUSATE SODIUM 100 MG PO CAPS: 100.0000 mg | ORAL_CAPSULE | Freq: Two times a day (BID) | ORAL | 0 refills | Status: DC

## 2016-07-19 NOTE — Op Note (Signed)
Cesarean Section Procedure Note  07/17/2016   Patient:  Michelle Hogan  31 y.o. female at 9050w3d.  Patient's last menstrual period was 10/31/2015. Preoperative diagnosis:  Term IUP with polyhydramnios, large for gestational age, breech, failed external cephalic version Postoperative diagnosis: same, live born female  PROCEDURE:  Procedure(s): C-Section (N/A) Surgeon:  Surgeon(s) and Role:    * Elenora Fenderhelsea C Tradarius Reinwald, MD - Primary Anesthesia:  spinal I/O: No intake/output data recorded. Specimens:  Cord Blood Complications: None Apparent Disposition:  VS stable to PACU  Findings: normal uterus, tubes and ovaries bilaterally, Live born female  Birth Weight: 8 lb 4.6 oz (3760 g) APGAR: 7, 8   Indication for procedure: 31 y.o. female at 4090w1d who has a history of PE, on lovenox this pregnancy, and with polyhydramnios.  She was found to have large for gestational age baby on last ultrasound, as well as has maintained breech position for several weeks.  An external cephalic version was attempted, and unsuccessful.   Procedure Details   The risks, benefits, complications, treatment options, and expected outcomes were discussed with the patient. Informed consent was obtained. The patient was taken to Operating Room, identified as Michelle Pondsmanda D Hulbert and the procedure verified as a cesarean delivery.   After administration of anesthesia, the patient was prepped and draped in the usual sterile manner, including a vaginal prep. A surgical time out was performed, with the pediatric team present. After confirming adequate anesthesia, a Pfannenstiel incision was made and carried down through the subcutaneous tissue to the fascia. Fascial incision was made and extended transversely. The fascia was separated from the underlying rectus tissue superiorly and inferiorly. The peritoneum was identified and entered. Peritoneal incision was extended longitudinally.  A low transverse uterine incision was made. Delivered from  frank breech presentation was a live born female . Delayed cord clamping was performed for 60 seconds. The umbilical cord was doubly clamped and cut, and the baby was handed off to the awaitng pediatrician.  Cord blood was obtained for evaluation. The placenta was removed intact and appeared normal. The uterus was delivered from the abdominal cavity and cleared of clots, membranes, and debris. The uterus, tubes and ovaries appeared normal. The uterine incision was closed with running locking sutures of 0 Vicryl, and then a second, imbricating stitch was placed. Hemostasis was observed. The abdominal cavity was evacuated of extraneous fluid. The uterus was returned to the abdominal cavity and again the incision was inspected for hemostasis, which was confirmed.  The paracolic gutters were cleaned. The fascia was then reapproximated with running suture of vicryl. 60cc of Long- and short-acting bupivicaine was injected circumferentially into the fascia.  After a change of gloves, the subcutaneous tissue was irrigated and reapproximated with 3-0 vicryl. The skin was closed with absorbable staples, and 40cc of long- and short-acting bupivacaine injected into the skin and subcutaneous tissues.  The incision was covered with surgical glue.     Instrument, sponge, and needle counts were correct prior the abdominal closure and at the conclusion of the case.   I was present and performed this procedure in its entirety.  ----- Ranae Plumberhelsea Diksha Tagliaferro, MD Attending Obstetrician and Gynecologist [facility], Department of OB/GYN [facility]

## 2016-07-19 NOTE — Discharge Summary (Signed)
Obstetrical Discharge Summary  Patient Name: Michelle Hogan DOB: 10-Sep-1985 MRN: [id number]  Date of Admission: 07/17/2016 Date of Delivery: 07/17/16 Delivered by: Ranae Plumberhelsea Ward, MD Date of Discharge: 07/19/2016  [facility]  XLK:[id number]:Patient's last menstrual period was 10/31/2015. EDC Estimated Date of Delivery: 08/06/16 Gestational Age at Delivery: 6584w1d   Antepartum complications:  1. Polyhydramnios 2. Breech 3. Personal history of PE, on lovenox - held x 24hrs 4. Obesity BMI 37 5. Rh negative  6. Postpartum depression 7. Large for gestational age   Admitting Diagnosis: breech, polyhydramnios Secondary Diagnosis: Patient Active Problem List   Diagnosis Date Noted  . Labor and delivery, indication for care 07/17/2016  . Primary hypercoagulable state (HCC) 01/04/2016  . High risk pregnancy, antepartum 12/20/2015  . Major depressive disorder, single episode, moderate (HCC) 08/18/2015  . Hyperlipidemia 08/12/2015  . FH: heart disease 08/12/2015  . Postpartum depression 08/12/2015  . Vitamin D deficiency 07/14/2015  . Obesity, Class II, BMI 35-39.9 07/12/2015  . Dysfunctional gallbladder 07/12/2015  . Hx pulmonary embolism 01/27/2015    Augmentation: none Complications: None Intrapartum complications/course: patient was admitted for attempted ECV, which was unsuccessful.  She then had an uncomplicated cesarean delivery. Date of Delivery: 07/17/16 Delivered By: Leeroy Bockhelsea Ward Delivery Type: primary cesarean section, low transverse incision Anesthesia: combined spinal and epidural Placenta: sponatneous Laceration: none Episiotomy: none Newborn Data: Live born female  Birth Weight: 8 lb 4.6 oz (3760 g) APGAR: 7, 8  Postpartum Procedures: none  Post partum course: uncomplicated  Patient had an uncomplicated postpartum course.  By time of discharge on POD#2, her pain was controlled on oral pain medications; she had appropriate lochia and was ambulating,  voiding without difficulty, tolerating regular diet and passing flatus.   She was deemed stable for discharge to home.    Discharge Physical Exam:  BP 102/61 (BP Location: Left Arm)   Pulse 91   Temp 97.7 F (36.5 C) (Oral)   Resp 18   Ht 5\' 5"  (1.651 m)   Wt 106.1 kg (234 lb)   LMP 10/31/2015   SpO2 100%   BMI 38.94 kg/m   General: NAD CV: RRR Pulm: CTABL, nl effort ABD: s/nd/nt, fundus firm and below the umbilicus Lochia: moderate Incision: c/d/i DVT Evaluation: LE non-ttp, no evidence of DVT on exam.  Hemoglobin  Date Value Ref Range Status  07/18/2016 10.7 (L) 12.0 - 16.0 g/dL Final   HGB  Date Value Ref Range Status  05/21/2014 12.3 12.0 - 16.0 g/dL Final   HCT  Date Value Ref Range Status  07/18/2016 30.3 (L) 35.0 - 47.0 % Final   Hematocrit  Date Value Ref Range Status  07/13/2015 40.8 34.0 - 46.6 % Final     Disposition: stable, discharge to home. Baby Feeding: breastmilk Baby Disposition: home with mom  Rh Immune globulin given: POD#1 Rubella vaccine given: n/a Tdap vaccine given in AP or PP setting: ap Flu vaccine given in AP or PP setting: ap  Contraception: LARC  Prenatal Labs:  Blood type/Rh A neg  Antibody screen neg  Rubella Immune  Varicella Immune  RPR NR  HBsAg Neg  HIV NR  GC neg  Chlamydia neg  Genetic screening negative  1 hour GTT 152  3 hour GTT WNL  GBS neg      Plan:  Michelle Hogan was discharged to home in good condition. [facility] with Dr. Elesa MassedWard in 2 weeks   Discharge Medications: Allergies as of  07/19/2016   No Known Allergies   lovenox 40 mg BID for 6 weeks  norco 5/325 Motrin 600 mg q 8 hrs  Colace 100 mg bid      Signed: Maisie Fus j Lasalle Abee md

## 2016-07-19 NOTE — Lactation Note (Signed)
This note was copied from a baby's chart. Lactation Consultation Note  Patient Name: Michelle Hogan Today's Date: 07/19/2016   [facility] Mom states that breastfeedign is still goin gwell. She denies any problems, questions of breast trauma. Baby at 7% weight loss, but plenty of voids and stools. She plans [facility] She has [facility] LC and support group info.   Maternal Data    Feeding    [facility]                      Lactation Tools Discussed/Used     Consult Status      Sunday CornSandra Clark Andrus Sharp 07/19/2016, 10:37 AM

## 2016-07-19 NOTE — Progress Notes (Signed)
All discharge instructions given to patient and she voices understanding of all instructions given. She will make her own f/u appt with Dr Elesa MassedWard. Prescription given. Patient discharged home with infant and spouse.

## 2016-07-19 NOTE — Discharge Instructions (Signed)
Follow up sooner with fever greater than 100.5, problems breathing, pain not helped by medications, severe depression ( more than just baby blues, wanting to hurt yourself or the baby), severe bleeding( saturating more than one pad an hour or large palm sized clots),or any incisional concerns such as increased redness, swelling, discharge or increased pain in incision.  No heavy lifting for 6 weeks.  No driving while taking narcotics. No douches, intercourse, tampons, or enemas for 6 weeks.  °

## 2016-07-19 NOTE — H&P (Signed)
OB History & Physical   History of Present Illness:  Chief Complaint:   HPI:  Michelle Hogan is a 31 y.o. G2P1001 female at [redacted]w[redacted]d dated by LMP of 10/31/15 with EDC of 08/06/16.  She presents to L&D for planned external cephalic version due to Breech position, with induction if successful or cesarean delivery if not.  IOL is indicated with polyhydramnios and fetopathy with estimated fetal weight of 8lbs at 37wks.  +FM, no CTX, no LOF, no VB  Pregnancy Issues: 1. Polyhydramnios 2. Breech 3. Personal history of PE, on lovenox - held x 24hrs 4. Obesity BMI 37 5. Rh negative  6. Postpartum depression  Maternal Medical History:   Past Medical History:  Diagnosis Date  . Depression   . PE (pulmonary embolism)     Past Surgical History:  Procedure Laterality Date  . ANKLE SURGERY Left   . CESAREAN SECTION N/A 07/17/2016   Procedure: C-Section;  Surgeon: Michelle Fender Danyon Mcginness, MD;  Location: [facility] ORS;  Service: Obstetrics;  Laterality: N/A;    No Known Allergies  Prior to Admission medications   Medication Sig Start Date End Date Taking? Authorizing Provider  Cholecalciferol (VITAMIN D) 2000 units CAPS Take 1 capsule (2,000 Units total) by mouth daily. 07/14/15  Yes Schuyler Amor, MD  enoxaparin (LOVENOX) 30 MG/0.3ML injection Inject 0.3 mLs (30 mg total) into the skin every 12 (twelve) hours. Patient taking differently: Inject 40 mg into the skin every 12 (twelve) hours.  12/20/15 07/17/16 Yes [place] Grotegut, MD  ferrous sulfate 325 (65 FE) MG tablet Take 65 mg by mouth daily with breakfast.   Yes Historical Provider, MD  folic acid (FOLVITE) 800 MCG tablet Take 400 mcg by mouth daily.   Yes Historical Provider, MD  Melatonin 5 MG TABS Take 1 tablet (5 mg total) by mouth daily after supper. 09/06/15  Yes Brandy Hale, MD  omeprazole (PRILOSEC) 10 MG capsule Take 10 mg by mouth daily.   Yes Historical Provider, MD  Prenatal Multivit-Min-Fe-FA (PRE-NATAL FORMULA) TABS Take by mouth.   Yes  Historical Provider, MD  vitamin B-12 (CYANOCOBALAMIN) 1000 MCG tablet Take 2,000 mcg by mouth daily.   Yes Historical Provider, MD  escitalopram (LEXAPRO) 10 MG tablet Take 10 mg by mouth daily. 11/08/15   Historical Provider, MD     [facility]ren[facility] care site[facility]     Social History: She  reports that she has never smoked. She has never used smokeless tobacco. She reports that she drinks about 0.6 oz of alcohol per week . She reports that she does not use drugs.  Family History: family history includes Bladder Cancer in her maternal grandfather; Deep vein thrombosis in her brother; Depression in her mother; Diabetes in her maternal grandfather; Heart attack in her father; Heart disease in her father; Hyperlipidemia in her mother.   Review of Systems: A full review of systems was performed and negative except as noted in the HPI.     Physical Exam:  Vital Signs: BP 102/61 (BP Location: Left Arm)   Pulse 91   Temp 97.7 F (36.5 C) (Oral)   Resp 18   Ht 5\' 5"  (1.651 m)   Wt 106.1 kg (234 lb)   LMP 10/31/2015   SpO2 100%   BMI 38.94 kg/m  General: no acute distress.  HEENT: normocephalic, atraumatic Heart: regular rate & rhythm.  No murmurs/rubs/gallops Lungs: clear to auscultation bilaterally, normal respiratory effort Abdomen: soft, gravid, non-tender;  EFW: 8lbs Pelvic:   External:  Normal external female genitalia  Cervix: closed    Extremities: non-tender, symmetric, 1+ edema bilaterally.  DTRs: 2+ Neurologic: Alert & oriented x 3.    No results found for this or any previous visit (from the past 24 hour(s)).  Pertinent Results:  Prenatal Labs: Blood type/Rh A neg  Antibody screen neg  Rubella Immune  Varicella Immune  RPR NR  HBsAg Neg  HIV NR  GC neg  Chlamydia neg  Genetic screening negative  1 hour GTT 152  3 hour GTT WNL  GBS neg   FHT: 135 TOCO: irritable, occasional ctx SVE: closed   Breech confirmed by bedside  ultrasound    Assessment:  Michelle Hogan is a 31 y.o. 242P1001 female at 3969w1d with planned ECV due to breech, polyhydramnios.   Plan:  1. Admit to Labor & Delivery 2. CBC, T&S, Clrs, IVF 3. GBS neg, abx not indicated  4. Consents obtained. 5. Continuous efm/toco 6. When provider, anesthesia, OR, staff available, will placed epidural/spinal and attempt version.  If unsuccessful will proceed with cesarean, if successful will induce.  Indication for IOL: polyhydramnios and fetopathy.  ----- Ranae Plumberhelsea Navarro Nine, MD Attending Obstetrician and Gynecologist [facility], Department of OB[facility]GYN [facility]

## 2016-09-24 ENCOUNTER — Emergency Department
Admission: EM | Admit: 2016-09-24 | Discharge: 2016-09-24 | Disposition: A | Discharge status: Home / Self Care | Payer: No Typology Code available for payment source | Attending: Emergency Medicine | Admitting: Emergency Medicine

## 2016-09-24 ENCOUNTER — Encounter: Payer: Self-pay | Admitting: Emergency Medicine

## 2016-09-24 ENCOUNTER — Emergency Department: Payer: No Typology Code available for payment source

## 2016-09-24 DIAGNOSIS — Z7901 Long term (current) use of anticoagulants: Secondary | ICD-10-CM | POA: Insufficient documentation

## 2016-09-24 DIAGNOSIS — M79604 Pain in right leg: Secondary | ICD-10-CM | POA: Diagnosis present

## 2016-09-24 DIAGNOSIS — Z86711 Personal history of pulmonary embolism: Secondary | ICD-10-CM | POA: Insufficient documentation

## 2016-09-24 DIAGNOSIS — I824Z1 Acute embolism and thrombosis of unspecified deep veins of right distal lower extremity: Secondary | ICD-10-CM | POA: Insufficient documentation

## 2016-09-24 DIAGNOSIS — Z86718 Personal history of other venous thrombosis and embolism: Secondary | ICD-10-CM | POA: Insufficient documentation

## 2016-09-24 MED ORDER — RIVAROXABAN 20 MG PO TABS: 20.0000 mg | ORAL_TABLET | Freq: Every day | ORAL | 2 refills | Status: DC

## 2016-09-24 MED ORDER — ENOXAPARIN SODIUM 100 MG/ML ~~LOC~~ SOLN
100.0000 mg | Freq: Once | SUBCUTANEOUS | Status: AC
  Administered 2016-09-24: 100 mg via SUBCUTANEOUS
  Filled 2016-09-24: qty 1

## 2016-09-24 NOTE — ED Notes (Signed)
Pt remains in US. Family in room. Offered drink while waiting but declined at this time.

## 2016-09-24 NOTE — ED Provider Notes (Addendum)
[facility] Emergency Department Provider Note  Time seen: 9:16 AM  I have reviewed the triage vital signs and the nursing notes.   HISTORY  Chief Complaint Leg Pain    HPI Michelle Hogan is a 31 y.o. 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 According to the patient and 2013 she had a PE, took a blood thinner for 6 months. During her last pregnancy she had a superficial blood clot. During this pregnancy she was kept on prophylactic heparin drip the pregnancy and then 6 weeks of xarelto following the pregnancy. This was discontinued 4 weeks ago. Patient states for the past 3 days she has had a mild aching to the right calf. Given the patient's history she became concerned so she came to the emergency department for evaluation. Denies any chest pain or trouble breathing. Denies any fever.  Past Medical History:  Diagnosis Date  . Depression   . PE (pulmonary embolism)     Patient Active Problem List   Diagnosis Date Noted  . Labor and delivery, indication for care 07/17/2016  . Primary hypercoagulable state (HCC) 01/04/2016  . High risk pregnancy, antepartum 12/20/2015  . Major depressive disorder, single episode, moderate (HCC) 08/18/2015  . Hyperlipidemia 08/12/2015  . FH: heart disease 08/12/2015  . Postpartum depression 08/12/2015  . Vitamin D deficiency 07/14/2015  . Obesity, Class II, BMI 35-39.9 07/12/2015  . Dysfunctional gallbladder 07/12/2015  . Hx pulmonary embolism 01/27/2015    Past Surgical History:  Procedure Laterality Date  . ANKLE SURGERY Left   . CESAREAN SECTION N/A 07/17/2016   Procedure: C-Section;  Surgeon: Elenora Fender Ward, MD;  Location: [facility] ORS;  Service: Obstetrics;  Laterality: N/A;    Prior to Admission medications   Medication Sig Start Date End Date Taking? Authorizing Provider  Cholecalciferol (VITAMIN D) 2000 units CAPS Take 1 capsule (2,000 Units  total) by mouth daily. 07/14/15   Plonk, Chrissie Noa, MD  docusate sodium (COLACE) 100 MG capsule Take 1 capsule (100 mg total) by mouth 2 (two) times daily. 07/19/16   Schermerhorn, Ihor Austin, MD  enoxaparin (LOVENOX) 40 MG/0.4ML injection Inject 0.4 mLs (40 mg total) into the skin every 12 (twelve) hours. 07/19/16   Schermerhorn, Ihor Austin, MD  ferrous sulfate 325 (65 FE) MG tablet Take 65 mg by mouth daily with breakfast.    [provider]  folic acid (FOLVITE) 800 MCG tablet Take 400 mcg by mouth daily.    [provider]  HYDROcodone-acetaminophen (NORCO/VICODIN) 5-325 MG tablet Take 1 tablet by mouth every 4 (four) hours as needed for moderate pain. 07/19/16   Schermerhorn, Ihor Austin, MD  ibuprofen (ADVIL,MOTRIN) 600 MG tablet Take 1 tablet (600 mg total) by mouth every 6 (six) hours. 07/19/16   Schermerhorn, Ihor Austin, MD  Melatonin 5 MG TABS Take 1 tablet (5 mg total) by mouth daily after supper. 09/06/15   Brandy Hale, MD  omeprazole (PRILOSEC) 10 MG capsule Take 10 mg by mouth daily.    [provider]  Prenatal Multivit-Min-Fe-FA (PRE-NATAL FORMULA) TABS Take by mouth.    [provider]  vitamin B-12 (CYANOCOBALAMIN) 1000 MCG tablet Take 2,000 mcg by mouth daily.    [provider]    No Known Allergies  Family History  Problem Relation Age of Onset  . Hyperlipidemia Mother   . Depression Mother   . Heart attack Father   . Heart disease  Father   . Deep vein thrombosis Brother   . Diabetes Maternal Grandfather   . Bladder Cancer Maternal Grandfather     Social History Social History  Substance Use Topics  . Smoking status: Never Smoker  . Smokeless tobacco: Never Used  . Alcohol use 0.6 oz/week    1 Cans of beer per week     Comment: rarely    Review of Systems Constitutional: Negative for fever. ENT: Negative for congestionOr recent illness. Cardiovascular: Negative for chest pain. Respiratory: Negative for shortness of  breath. Gastrointestinal: Negative for abdominal pain, vomiting  Musculoskeletal: Positive for right calf pain, mild dull aching pain. Skin: Negative for rash/redness. Neurological: Negative for headache All other ROS negative  ____________________________________________   PHYSICAL EXAM:  VITAL SIGNS: ED Triage Vitals [09/24/16 0905]  Enc Vitals Group     BP (!) 108/56     Pulse Rate 78     Resp 18     Temp 98.5 F (36.9 C)     Temp Source Oral     SpO2 98 %     Weight 225 lb (102.1 kg)     Height 5\' 5"  (1.651 m)     Head Circumference      Peak Flow      Pain Score 2     Pain Loc      Pain Edu?      Excl. in GC?     Constitutional: Alert and oriented. Well appearing and in no distress. Eyes: Normal exam ENT   Head: Normocephalic and atraumatic   Mouth/Throat: Mucous membranes are moist. Cardiovascular: Normal rate, regular rhythm. No murmur Respiratory: Normal respiratory effort without tachypnea nor retractions. Breath sounds are clear  Gastrointestinal: Soft and nontender. No distention.  Musculoskeletal: Mild calf tenderness palpation, no popliteal fossa tenderness. No appreciable edema. No appreciable erythema. DP pulses intact, Warm extremity. Neurologic:  Normal speech and language. No gross focal neurologic deficits Skin:  Skin is warm, dry and intact.  Psychiatric: Mood and affect are normal.   ____________________________________________     RADIOLOGY  Ultrasound positive for DVT of the peroneal vein of the right calf.  ____________________________________________   INITIAL IMPRESSION / ASSESSMENT AND PLAN / ED COURSE  Pertinent labs & imaging results that were available during my care of the patient were reviewed by me and considered in my medical decision making (see chart for details).  The patient presents to the emergency department with right calf pain for the past 3 days. History of DVT in the past. History of PE. Patient was taken  off of her blood thinner 4 weeks ago. We will obtain a right lower extremity ultrasound to rule out DVT. Overall patient appears well, no distress, normal physical examination besides mild calf tenderness. Denies any chest pain or trouble breathing. Normal Vitals.  Ultrasound positive for DVT. Patient is currently breast-feeding. In reviewing xarelto information it appears to be safe during breastfeeding.  I discussed with the patient to contact her pediatrician as well as her OB tomorrow to discuss this further and inform them that she is taking this medication. As there is some excoriation in breast milk we will start the patient on 20 mg once daily, as the higher excretions were noted to be found and women who are taking 15 mg twice daily medication. I believe 20 mg once daily should provide acceptable anticoagulation while staying within an acceptable amount of excreted medication through breast milk.  I discussed the patient with the [facility]. They state they will call mom today within the next 1-2 hours to discuss the anticoagulation further to make sure they are okay with Xarelto. In the meantime we will dose mom with a 1 time dose of Lovenox in the emergency department until she can clarify with her OB as well as her pediatrician to make sure they are okay with Xarelto, otherwise she will need to be taking twice daily Lovenox injections.  ____________________________________________   FINAL CLINICAL IMPRESSION(S) / ED DIAGNOSES  DVT    Minna AntisPaduchowski, Elasha Tess, MD 09/24/16 1143    Minna AntisPaduchowski, Roberto Romanoski, MD 09/24/16 1204

## 2016-09-24 NOTE — ED Notes (Signed)
MD at bedside to update pt

## 2016-09-24 NOTE — Discharge Instructions (Signed)
As we discussed please follow-up with your pediatric[facility] as well [facility]. If they are okay with taking Xarelto please fill and begin taking tomorrow morning. Return to the emergency department for any chest pain, trouble breathing, or any other symptom personally concerning to yourself.

## 2016-09-24 NOTE — ED Notes (Signed)
Patient transported to Ultrasound 

## 2016-09-24 NOTE — ED Triage Notes (Addendum)
Pt c/o right calf pain that started about 3 days ago, states she has hx of blood clots in left ankle and PE in 2013. Pt states the pain is constant but does improve at times when she flexes. Not currently on a blood thinner.  Pt is 2 months post-partum and was on blood thinners during pregnancy and 6 weeks after.

## 2016-09-25 ENCOUNTER — Telehealth: Payer: Self-pay | Admitting: *Deleted

## 2016-09-25 MED ORDER — ENOXAPARIN SODIUM 100 MG/ML ~~LOC~~ SOLN: 100.0000 mg | Freq: Two times a day (BID) | SUBCUTANEOUS | 1 refills | Status: DC

## 2016-09-25 NOTE — Telephone Encounter (Signed)
Call placed to patient after she called for an appt due to developing a DVT 4 weeks post completing her anticoagulation.Per VO Dr Orlie DakinFinnegan, she should be on Lovenox 1 mg/kg BID and see Dr Donneta RombergBrahmanday once he returns from vacation He also said if she stops breastfeeding, she should convert to Xarelto. She had told the scheduler that her babies pediatrician put her on Lovennox, but did not reveal the dosage. I got her VM when I called her and told her the dose she should be on and that she needed to please return my call to discuss further. Colette informed to schedule fu when Dr B returns adnb that if she speaks with patient before me that I need to speak with her.

## 2016-09-25 NOTE — Telephone Encounter (Signed)
Patient returned call and stated that the ER had given her Xarelto Rx, but no one has given her Lovenox Rx. I e scribed Lovenox 100 mg bid times 30 days plus 1 refill  As she wants to continue to breast feed for another 4 months and it will most likely be mid July before Dr B has an opening She [facility] [address] and stated she will more than likely need a PA on it from past experience

## 2016-09-29 NOTE — Telephone Encounter (Signed)
Note: As of 09/29/16- our office did not receive any PA request for the medication.

## 2016-10-01 ENCOUNTER — Encounter: Payer: Self-pay | Admitting: Emergency Medicine

## 2016-10-01 ENCOUNTER — Emergency Department
Admission: EM | Admit: 2016-10-01 | Discharge: 2016-10-01 | Disposition: A | Discharge status: Home / Self Care | Payer: No Typology Code available for payment source | Attending: Emergency Medicine | Admitting: Emergency Medicine

## 2016-10-01 DIAGNOSIS — Z7982 Long term (current) use of aspirin: Secondary | ICD-10-CM | POA: Insufficient documentation

## 2016-10-01 DIAGNOSIS — R195 Other fecal abnormalities: Secondary | ICD-10-CM | POA: Diagnosis present

## 2016-10-01 DIAGNOSIS — Z791 Long term (current) use of non-steroidal anti-inflammatories (NSAID): Secondary | ICD-10-CM | POA: Diagnosis not present

## 2016-10-01 DIAGNOSIS — K922 Gastrointestinal hemorrhage, unspecified: Secondary | ICD-10-CM

## 2016-10-01 DIAGNOSIS — K625 Hemorrhage of anus and rectum: Secondary | ICD-10-CM | POA: Diagnosis not present

## 2016-10-01 DIAGNOSIS — K5900 Constipation, unspecified: Secondary | ICD-10-CM | POA: Diagnosis not present

## 2016-10-01 DIAGNOSIS — I82409 Acute embolism and thrombosis of unspecified deep veins of unspecified lower extremity: Secondary | ICD-10-CM | POA: Insufficient documentation

## 2016-10-01 DIAGNOSIS — Z79899 Other long term (current) drug therapy: Secondary | ICD-10-CM | POA: Diagnosis not present

## 2016-10-01 DIAGNOSIS — Z7901 Long term (current) use of anticoagulants: Secondary | ICD-10-CM | POA: Insufficient documentation

## 2016-10-01 HISTORY — DX: Acute embolism and thrombosis of unspecified deep veins of unspecified lower extremity: I82.409

## 2016-10-01 LAB — CBC
HCT: 39.6 % (ref 35.0–47.0)
Hemoglobin: 13.6 g/dL (ref 12.0–16.0)
MCH: 29.4 pg (ref 26.0–34.0)
MCHC: 34.4 g/dL (ref 32.0–36.0)
MCV: 85.5 fL (ref 80.0–100.0)
PLATELETS: 296 10*3/uL (ref 150–440)
RBC: 4.63 MIL/uL (ref 3.80–5.20)
RDW: 13.4 % (ref 11.5–14.5)
WBC: 6.2 10*3/uL (ref 3.6–11.0)

## 2016-10-01 LAB — COMPREHENSIVE METABOLIC PANEL
ALK PHOS: 74 U/L (ref 38–126)
ALT: 19 U/L (ref 14–54)
AST: 21 U/L (ref 15–41)
Albumin: 4.5 g/dL (ref 3.5–5.0)
Anion gap: 7 (ref 5–15)
BUN: 12 mg/dL (ref 6–20)
CALCIUM: 9.4 mg/dL (ref 8.9–10.3)
CHLORIDE: 107 mmol/L (ref 101–111)
CO2: 26 mmol/L (ref 22–32)
CREATININE: 0.74 mg/dL (ref 0.44–1.00)
Glucose, Bld: 104 mg/dL — ABNORMAL HIGH (ref 65–99)
Potassium: 4 mmol/L (ref 3.5–5.1)
SODIUM: 140 mmol/L (ref 135–145)
Total Bilirubin: 0.3 mg/dL (ref 0.3–1.2)
Total Protein: 7.7 g/dL (ref 6.5–8.1)

## 2016-10-01 LAB — PROTIME-INR
INR: 1.02
Prothrombin Time: 13.4 seconds (ref 11.4–15.2)

## 2016-10-01 MED ORDER — GLYCERIN (ADULT) 2 G RE SUPP: 1.0000 | RECTAL | 0 refills | Status: DC | PRN

## 2016-10-01 MED ORDER — MAGNESIUM HYDROXIDE 400 MG/5ML PO SUSP: 30.0000 mL | Freq: Every day | ORAL | 0 refills | Status: DC | PRN

## 2016-10-01 NOTE — ED Provider Notes (Signed)
[facility] Emergency Department Provider Note  ____________________________________________   First MD Initiated Contact with Patient 10/01/16 1731     (approximate)  I have reviewed the triage vital signs and the nursing notes.   HISTORY  Chief Complaint Blood In Stools   HPI Michelle Hogan is a 31 y.o. female with a history of DVT and pulmonary embolus on Lovenox. She was last diagnosed with a DVT this past Sunday and started on Lovenox. She has been taking 80 mg twice a day until her insurance clearance past which did today. She now has her prescriber 100 mg dose. However, she also had a hemorrhoidectomy this past Wednesday. After the hemorrhoidectomy this past Wednesday she began having bleeding in her stool on Thursday. She says that she is only bleeding when she has a bowel movement. First time she had bleeding with this past Thursday when she had small pellets stools. She says that there was bright red blood at that time. She said there was no bleeding until today when she had a pinky sized clots as well as what appeared as black stool. She has not been taking any stool softeners. She is not having any blood except when she goes to move her bowels. She says that she also feels constipated. She said that the black stool today was also small pellets.    Past Medical History:  Diagnosis Date  . Depression   . DVT (deep venous thrombosis) (HCC)   . PE (pulmonary embolism)     Patient Active Problem List   Diagnosis Date Noted  . Labor and delivery, indication for care 07/17/2016  . Primary hypercoagulable state (HCC) 01/04/2016  . High risk pregnancy, antepartum 12/20/2015  . Major depressive disorder, single episode, moderate (HCC) 08/18/2015  . Hyperlipidemia 08/12/2015  . FH: heart disease 08/12/2015  . Postpartum depression 08/12/2015  . Vitamin D deficiency 07/14/2015  . Obesity, Class II, BMI 35-39.9 07/12/2015  . Dysfunctional gallbladder  07/12/2015  . Hx pulmonary embolism 01/27/2015    Past Surgical History:  Procedure Laterality Date  . ANKLE SURGERY Left   . CESAREAN SECTION N/A 07/17/2016   Procedure: C-Section;  Surgeon: Elenora Fender Ward, MD;  Location: [facility] ORS;  Service: Obstetrics;  Laterality: N/A;    Prior to Admission medications   Medication Sig Start Date End Date Taking? Authorizing Provider  enoxaparin (LOVENOX) 100 MG/ML injection Inject 1 mL (100 mg total) into the skin every 12 (twelve) hours. 09/25/16  Yes Jeralyn Ruths, MD  Cholecalciferol (VITAMIN D) 2000 units CAPS Take 1 capsule (2,000 Units total) by mouth daily. Patient not taking: Reported on 10/01/2016 07/14/15   Schuyler Amor, MD  docusate sodium (COLACE) 100 MG capsule Take 1 capsule (100 mg total) by mouth 2 (two) times daily. Patient not taking: Reported on 10/01/2016 07/19/16   Schermerhorn, Ihor Austin, MD  ferrous sulfate 325 (65 FE) MG tablet Take 65 mg by mouth daily with breakfast.    [provider]  folic acid (FOLVITE) 800 MCG tablet Take 400 mcg by mouth daily.    [provider]  HYDROcodone-acetaminophen (NORCO/VICODIN) 5-325 MG tablet Take 1 tablet by mouth every 4 (four) hours as needed for moderate pain. Patient not taking: Reported on 10/01/2016 07/19/16   Schermerhorn, Ihor Austin, MD  ibuprofen (ADVIL,MOTRIN) 600 MG tablet Take 1 tablet (600 mg total) by mouth every 6 (six) hours. Patient not taking: Reported on 10/01/2016 07/19/16   Schermerhorn, Ihor Austin, MD  Melatonin 5  MG TABS Take 1 tablet (5 mg total) by mouth daily after supper. Patient not taking: Reported on 10/01/2016 09/06/15   Brandy Hale, MD  omeprazole (PRILOSEC) 10 MG capsule Take 10 mg by mouth daily.    [provider]  Prenatal Multivit-Min-Fe-FA (PRE-NATAL FORMULA) TABS Take by mouth.    [provider]  rivaroxaban (XARELTO) 20 MG TABS tablet Take 1 tablet (20 mg total) by mouth daily with supper. Patient not taking: Reported on  10/01/2016 09/24/16   Minna Antis, MD  vitamin B-12 (CYANOCOBALAMIN) 1000 MCG tablet Take 2,000 mcg by mouth daily.    [provider]    Allergies Patient has no known allergies.  Family History  Problem Relation Age of Onset  . Hyperlipidemia Mother   . Depression Mother   . Heart attack Father   . Heart disease Father   . Deep vein thrombosis Brother   . Diabetes Maternal Grandfather   . Bladder Cancer Maternal Grandfather     Social History Social History  Substance Use Topics  . Smoking status: Never Smoker  . Smokeless tobacco: Never Used  . Alcohol use 0.6 oz/week    1 Cans of beer per week     Comment: rarely    Review of Systems  Constitutional: No fever/chills Eyes: No visual changes. ENT: No sore throat. Cardiovascular: Denies chest pain. Respiratory: Denies shortness of breath. Gastrointestinal: No abdominal pain.  No nausea, no vomiting.  No diarrhea.   Genitourinary: Negative for dysuria. Musculoskeletal: Negative for back pain. Skin: Negative for rash. Neurological: Negative for headaches, focal weakness or numbness.   ____________________________________________   PHYSICAL EXAM:  VITAL SIGNS: ED Triage Vitals  Enc Vitals Group     BP 10/01/16 1449 118/76     Pulse Rate 10/01/16 1449 (!) 108     Resp 10/01/16 1449 18     Temp 10/01/16 1449 98.6 F (37 C)     Temp Source 10/01/16 1449 Oral     SpO2 10/01/16 1449 99 %     Weight 10/01/16 1450 225 lb (102.1 kg)     Height 10/01/16 1450 5\' 5"  (1.651 m)     Head Circumference --      Peak Flow --      Pain Score 10/01/16 1448 3     Pain Loc --      Pain Edu? --      Excl. in GC? --     Constitutional: Alert and oriented. Well appearing and in no acute distress. Eyes: Conjunctivae are normal.  Head: Atraumatic. Nose: No congestion/rhinnorhea. Mouth/Throat: Mucous membranes are moist.  Neck: No stridor.   Cardiovascular: Normal rate, regular rhythm. Grossly normal heart  sounds.   Respiratory: Normal respiratory effort.  No retractions. Lungs CTAB. Gastrointestinal: Soft and nontender. No distention.   External exam without any active bleeding. A small amount of brown stool is visualized outside of the anal verge. No obvious hemorrhoids visualized. Digital rectal exam with brown to rust colored stool which is moderately heme positive.  Musculoskeletal: No lower extremity tenderness nor edema.  No joint effusions. Neurologic:  Normal speech and language. No gross focal neurologic deficits are appreciated. Skin:  Skin is warm, dry and intact. No rash noted. Psychiatric: Mood and affect are normal. Speech and behavior are normal.  ____________________________________________   LABS (all labs ordered are listed, but only abnormal results are displayed)  Labs Reviewed  COMPREHENSIVE METABOLIC PANEL - Abnormal; Notable for the following:  Result Value   Glucose, Bld 104 (*)    All other components within normal limits  CBC  PROTIME-INR  POC OCCULT BLOOD, ED  TYPE AND SCREEN   ____________________________________________  EKG   ____________________________________________  RADIOLOGY   ____________________________________________   PROCEDURES  Procedure(s) performed:   Procedures  Critical Care performed:   ____________________________________________   INITIAL IMPRESSION / ASSESSMENT AND PLAN / ED COURSE  Pertinent labs & imaging results that were available during my care of the patient were reviewed by me and considered in my medical decision making (see chart for details).  ----------------------------------------- 7:01 PM on 10/01/2016 -----------------------------------------  I discussed the case with Dr. Doristine CounterBurnett of the surgical service who says that it is quite possible that the bleeding could be from the scar from a hemorrhoidectomy. He recommends that the patient start milk of magnesia as well as use glycerin  suppositories. I also discussed the case with Dr. Smith Robertao of oncology who encourages the patient to continue her Lovenox and says that she'll be contacting the patient for a follow-up appointment this week to test her hemoglobin. The patient's heart rate was in the room was 50 bpm. Her labs are extremely reassuring after the 2 episodes of bleeding with a hemoglobin that is normal at 13.6. I discussed with the patient continuing on her prescribed dose of Lovenox and returning to the emergency department for any more severe symptoms such as more severe bleeding, shortness of breath or tachycardia/palpitations. The patient is understanding of wine to comply with this plan. She is also aware that the cancer center will be contacting her for follow-up as an outpatient. I also discussed with the consultations with Dr. Doristine CounterBurnett and that she should call her surgeon for further follow-up since she is having bleeding after this procedure.      ____________________________________________   FINAL CLINICAL IMPRESSION(S) / ED DIAGNOSES  Constipation. Rectal bleeding.    NEW MEDICATIONS STARTED DURING THIS VISIT:  New Prescriptions   No medications on file     Note:  This document was prepared using Dragon voice recognition software and may include unintentional dictation errors.     Myrna BlazerSchaevitz, David Matthew, MD 10/01/16 Mikle [id number]

## 2016-10-01 NOTE — ED Notes (Signed)
Pt arrives from home with husband and baby, pt reports that she was diagnosed with a dvt last week and started on xarelto, pt states the pediatrician didn't want her to use that med due to breast feeding. Pt started using her lovenox but is under dosing slightly due to the lovenox being left over from when she was pregnant, pt also reports that she had hemmhroid surg on Wednesday and has been passing some bright red blood with black stool. Pt also states that she is having a problem with passing stool and feels like she is constipated. Pt reports that she called the nurse triage line who was concerned about the passing of blood and the amount the pt described as well as the black colored stool. Pt denies any abd pain, pt states that she feels constipated and her rectal area is sore. Pt is able to care for child and husband is helping her at this time, no distress noted, pt breastfeeding child at my departure from exam room

## 2016-10-01 NOTE — ED Triage Notes (Signed)
Pt c/o black stools with some red blood as well.  Is on lovenox since DVT diagnosed here recently.  No vomiting. Also had hemorrhoid removed last Thursday.  Did see a clot. Ambulatory to triage. Does not appear pale at this time.  No fevers.

## 2016-10-02 ENCOUNTER — Telehealth: Payer: Self-pay | Admitting: *Deleted

## 2016-10-02 DIAGNOSIS — Z86711 Personal history of pulmonary embolism: Secondary | ICD-10-CM

## 2016-10-02 NOTE — Telephone Encounter (Signed)
Contacted patient. Provided pt with [facility] apt [facility]. V/o recheck cbc per Dr. Rao.-lab orders entered in chl.  Discussed current Lovenox dosing with patient. She is on Lovenox 100 mg twice daily. She has insurance approval for this. Pt instructed per v/o Dr. Smith Robertao to continue this dosing of Lovenox.  Teach back process performed with patient.

## 2016-10-05 ENCOUNTER — Other Ambulatory Visit: Payer: Self-pay

## 2016-10-05 ENCOUNTER — Inpatient Hospital Stay: Payer: No Typology Code available for payment source | Attending: Internal Medicine

## 2016-10-05 DIAGNOSIS — Z79899 Other long term (current) drug therapy: Secondary | ICD-10-CM | POA: Diagnosis not present

## 2016-10-05 DIAGNOSIS — F329 Major depressive disorder, single episode, unspecified: Secondary | ICD-10-CM | POA: Diagnosis not present

## 2016-10-05 DIAGNOSIS — I824Z1 Acute embolism and thrombosis of unspecified deep veins of right distal lower extremity: Secondary | ICD-10-CM | POA: Insufficient documentation

## 2016-10-05 DIAGNOSIS — Z7901 Long term (current) use of anticoagulants: Secondary | ICD-10-CM | POA: Insufficient documentation

## 2016-10-05 DIAGNOSIS — Z86711 Personal history of pulmonary embolism: Secondary | ICD-10-CM | POA: Diagnosis not present

## 2016-10-05 LAB — CBC WITH DIFFERENTIAL/PLATELET
BASOS ABS: 0 10*3/uL (ref 0–0.1)
BASOS PCT: 0 %
Eosinophils Absolute: 0.1 10*3/uL (ref 0–0.7)
Eosinophils Relative: 2 %
HEMATOCRIT: 39.1 % (ref 35.0–47.0)
Hemoglobin: 13 g/dL (ref 12.0–16.0)
LYMPHS PCT: 52 %
Lymphs Abs: 2.8 10*3/uL (ref 1.0–3.6)
MCH: 28.5 pg (ref 26.0–34.0)
MCHC: 33.4 g/dL (ref 32.0–36.0)
MCV: 85.3 fL (ref 80.0–100.0)
MONO ABS: 0.4 10*3/uL (ref 0.2–0.9)
Monocytes Relative: 7 %
NEUTROS ABS: 2.2 10*3/uL (ref 1.4–6.5)
Neutrophils Relative %: 39 %
PLATELETS: 275 10*3/uL (ref 150–440)
RBC: 4.58 MIL/uL (ref 3.80–5.20)
RDW: 13.4 % (ref 11.5–14.5)
WBC: 5.5 10*3/uL (ref 3.6–11.0)

## 2016-10-07 LAB — TYPE AND SCREEN
ABO/RH(D): A NEG
Antibody Screen: POSITIVE
Unit division: 0
Unit division: 0

## 2016-10-07 LAB — BPAM RBC
Blood Product Expiration Date: 201806192359
Blood Product Expiration Date: 201806192359
ISSUE DATE / TIME: 201806121346
ISSUE DATE / TIME: 201806121028
UNIT TYPE AND RH: 600
UNIT TYPE AND RH: 600

## 2016-10-09 ENCOUNTER — Encounter: Payer: Self-pay | Admitting: Oncology

## 2016-10-09 ENCOUNTER — Other Ambulatory Visit: Payer: Self-pay | Admitting: *Deleted

## 2016-10-09 ENCOUNTER — Inpatient Hospital Stay (HOSPITAL_BASED_OUTPATIENT_CLINIC_OR_DEPARTMENT_OTHER): Payer: No Typology Code available for payment source | Admitting: Oncology

## 2016-10-09 VITALS — BP 108/76 | HR 82 | Temp 97.6°F | Resp 18 | Wt 233.2 lb

## 2016-10-09 DIAGNOSIS — Z86711 Personal history of pulmonary embolism: Secondary | ICD-10-CM | POA: Diagnosis not present

## 2016-10-09 DIAGNOSIS — Z7901 Long term (current) use of anticoagulants: Secondary | ICD-10-CM | POA: Diagnosis not present

## 2016-10-09 DIAGNOSIS — F329 Major depressive disorder, single episode, unspecified: Secondary | ICD-10-CM

## 2016-10-09 DIAGNOSIS — Z79899 Other long term (current) drug therapy: Secondary | ICD-10-CM

## 2016-10-09 DIAGNOSIS — I824Z1 Acute embolism and thrombosis of unspecified deep veins of right distal lower extremity: Secondary | ICD-10-CM

## 2016-10-09 DIAGNOSIS — O871 Deep phlebothrombosis in the puerperium: Secondary | ICD-10-CM

## 2016-10-09 MED ORDER — ENOXAPARIN SODIUM 100 MG/ML ~~LOC~~ SOLN: 100.0000 mg | Freq: Two times a day (BID) | SUBCUTANEOUS | 2 refills | Status: DC

## 2016-10-09 NOTE — Progress Notes (Signed)
Hematology/Oncology Consult note [facility]  [phone] [phone]  Patient Care Team: Duanne LimerickJones, Deanna C, MD as PCP - General (Family Medicine)   Name of the patient: Michelle Hogan  [id number]  [birth year]   Date of visit: 10/09/16  Diagnosis- 1. H/o provoked PE in 2013 2. Recurrent post partum DVT in 2018 3. h/o Protein S deficiency in her brother  Chief complaint/ Reason for visit- f/u for post partum DVT  Heme/Onc history: Patient is a 31 year old female with a history of PE in 2013 which was activated to long distance trip and oral contraceptive use. She does Xarelto for 6 months and then stopped. Patient had been pregnant- 2016-1st pregnancy- she had been on lovenox 30 mg BID until 28 weeks;40mg  BID until; and 6-8 weeks post pregnancy. [facility] No postpartum complications and she had normal vaginal delivery. She was pregnant for second time and delivered by C section on 07/17/16 for breech presentation and polyhydramnios. She was on prophylactic lovenox during this period. Her brother was diagnosed with DVT when he was 6129 around the time of her second pregnancy and found to have protein S deficiency. Levels were checked while on anticoagulation and off anticoagulation and were positive. Patient had factor V leiden and prothrombin gene mutation checked during second pregnancy which was negative.   Patient was found to DVT of the peroneal vein of right calf on 09/24/16 when she complained of cramping. She did not miss her lovenox doses. She is currently breast feeding and is on lovenox 100 mg Goff BID   Interval history- doing well post partum. Denies any complaints. She is able to continue her lovenox shots without any bleeding issues  ECOG PS- 0 Pain scale- 0   Review of systems- Review of Systems  Constitutional: Negative for chills, fever, malaise/fatigue and weight loss.  HENT: Negative for congestion, ear  discharge and nosebleeds.   Eyes: Negative for blurred vision.  Respiratory: Negative for cough, hemoptysis, sputum production, shortness of breath and wheezing.   Cardiovascular: Negative for chest pain, palpitations, orthopnea and claudication.  Gastrointestinal: Negative for abdominal pain, blood in stool, constipation, diarrhea, heartburn, melena, nausea and vomiting.  Genitourinary: Negative for dysuria, flank pain, frequency, hematuria and urgency.  Musculoskeletal: Negative for back pain, joint pain and myalgias.  Skin: Negative for rash.  Neurological: Negative for dizziness, tingling, focal weakness, seizures, weakness and headaches.  Endo/Heme/Allergies: Does not bruise/bleed easily.  Psychiatric/Behavioral: Negative for depression and suicidal ideas. The patient does not have insomnia.      No Known Allergies   Past Medical History:  Diagnosis Date  . Depression   . DVT (deep venous thrombosis) (HCC)   . PE (pulmonary embolism)      Past Surgical History:  Procedure Laterality Date  . ANKLE SURGERY Left   . CESAREAN SECTION N/A 07/17/2016   Procedure: C-Section;  Surgeon: Elenora Fenderhelsea C Ward, MD;  Location: [facility] ORS;  Service: Obstetrics;  Laterality: N/A;    Social History   Social History  . Marital status: Married    Spouse name: N/A  . Number of children: N/A  . Years of education: N/A   Occupational History  . Not on file.   Social History Main Topics  . Smoking status: Never Smoker  . Smokeless tobacco: Never Used  . Alcohol use 0.6 oz/week    1 Cans of beer per week     Comment: rarely  . Drug use: No  .  Sexual activity: Yes    Birth control/ protection: IUD   Other Topics Concern  . Not on file   Social History Narrative  . No narrative on file    Family History  Problem Relation Age of Onset  . Hyperlipidemia Mother   . Depression Mother   . Heart attack Father   . Heart disease Father   . Deep vein thrombosis Brother   . Diabetes  Maternal Grandfather   . Bladder Cancer Maternal Grandfather      Current Outpatient Prescriptions:  .  docusate sodium (COLACE) 100 MG capsule, Take 1 capsule (100 mg total) by mouth 2 (two) times daily., Disp: 30 capsule, Rfl: 0 .  Cholecalciferol (VITAMIN D) 2000 units CAPS, Take 1 capsule (2,000 Units total) by mouth daily. (Patient not taking: Reported on 10/01/2016), Disp: 30 capsule, Rfl:  .  enoxaparin (LOVENOX) 100 MG/ML injection, Inject 1 mL (100 mg total) into the skin every 12 (twelve) hours., Disp: 60 Syringe, Rfl: 2 .  ferrous sulfate 325 (65 FE) MG tablet, Take 65 mg by mouth daily with breakfast., Disp: , Rfl:  .  folic acid (FOLVITE) 800 MCG tablet, Take 400 mcg by mouth daily., Disp: , Rfl:  .  glycerin adult 2 g suppository, Place 1 suppository rectally as needed for constipation. (Patient not taking: Reported on 10/09/2016), Disp: 12 suppository, Rfl: 0 .  HYDROcodone-acetaminophen (NORCO/VICODIN) 5-325 MG tablet, Take 1 tablet by mouth every 4 (four) hours as needed for moderate pain. (Patient not taking: Reported on 10/01/2016), Disp: 30 tablet, Rfl: 0 .  ibuprofen (ADVIL,MOTRIN) 600 MG tablet, Take 1 tablet (600 mg total) by mouth every 6 (six) hours. (Patient not taking: Reported on 10/01/2016), Disp: 30 tablet, Rfl: 0 .  magnesium hydroxide (MILK OF MAGNESIA) 400 MG/5ML suspension, Take 30 mLs by mouth daily as needed for mild constipation. (Patient not taking: Reported on 10/09/2016), Disp: 360 mL, Rfl: 0 .  Melatonin 5 MG TABS, Take 1 tablet (5 mg total) by mouth daily after supper. (Patient not taking: Reported on 10/01/2016), Disp: 30 tablet, Rfl: 0 .  omeprazole (PRILOSEC) 10 MG capsule, Take 10 mg by mouth daily., Disp: , Rfl:  .  Prenatal Multivit-Min-Fe-FA (PRE-NATAL FORMULA) TABS, Take by mouth., Disp: , Rfl:  .  vitamin B-12 (CYANOCOBALAMIN) 1000 MCG tablet, Take 2,000 mcg by mouth daily., Disp: , Rfl:   Physical exam:  Vitals:   10/09/16 1146  BP: 108/76  Pulse:  82  Resp: 18  Temp: 97.6 F (36.4 C)  TempSrc: Tympanic  Weight: 233 lb 4 oz (105.8 kg)   Physical Exam  Constitutional: She is oriented to person, place, and time.  She is obese. Appears in no acute distress  HENT:  Head: Normocephalic and atraumatic.  Eyes: EOM are normal. Pupils are equal, round, and reactive to light.  Neck: Normal range of motion.  Cardiovascular: Normal rate, regular rhythm and normal heart sounds.   Pulmonary/Chest: Effort normal and breath sounds normal.  Abdominal: Soft. Bowel sounds are normal.  c section scar has healed well. No signs of infection  Neurological: She is alert and oriented to person, place, and time.  Skin: Skin is warm and dry.     CMP Latest Ref Rng & Units 10/01/2016  Glucose 65 - 99 mg/dL 161(W)  BUN 6 - 20 mg/dL 12  Creatinine 9.60 - 4.54 mg/dL 0.98  Sodium 119 - 147 mmol/L 140  Potassium 3.5 - 5.1 mmol/L 4.0  Chloride 101 - 111 mmol/L  107  CO2 22 - 32 mmol/L 26  Calcium 8.9 - 10.3 mg/dL 9.4  Total Protein 6.5 - 8.1 g/dL 7.7  Total Bilirubin 0.3 - 1.2 mg/dL 0.3  Alkaline Phos 38 - 126 U/L 74  AST 15 - 41 U/L 21  ALT 14 - 54 U/L 19   CBC Latest Ref Rng & Units 10/05/2016  WBC 3.6 - 11.0 K/uL 5.5  Hemoglobin 12.0 - 16.0 g/dL 29.5  Hematocrit 62.1 - 47.0 % 39.1  Platelets 150 - 440 K/uL 275    No images are attached to the encounter.  US Venous Img Lower Unilateral Right  Result Date: 09/24/2016 CLINICAL DATA:  Right calf pain over the last 3 days. EXAM: Right LOWER EXTREMITY VENOUS DOPPLER ULTRASOUND TECHNIQUE: Gray-scale sonography with graded compression, as well as color Doppler and duplex ultrasound were performed to evaluate the lower extremity deep venous systems from the level of the common femoral vein and including the common femoral, femoral, profunda femoral, popliteal and calf veins including the posterior tibial, peroneal and gastrocnemius veins when visible. The superficial great saphenous vein was also  interrogated. Spectral Doppler was utilized to evaluate flow at rest and with distal augmentation maneuvers in the common femoral, femoral and popliteal veins. COMPARISON:  None. FINDINGS: Contralateral Common Femoral Vein: Respiratory phasicity is normal and symmetric with the symptomatic side. No evidence of thrombus. Normal compressibility. Common Femoral Vein: No evidence of thrombus. Normal compressibility, respiratory phasicity and response to augmentation. Saphenofemoral Junction: No evidence of thrombus. Normal compressibility and flow on color Doppler imaging. Profunda Femoral Vein: No evidence of thrombus. Normal compressibility and flow on color Doppler imaging. Femoral Vein: No evidence of thrombus. Normal compressibility, respiratory phasicity and response to augmentation. Popliteal Vein: No evidence of thrombus. Normal compressibility, respiratory phasicity and response to augmentation. Calf Veins: Thrombus within the peroneal vein. Superficial Great Saphenous Vein: No evidence of thrombus. Normal compressibility and flow on color Doppler imaging. Venous Reflux:  None. Other Findings:  None. IMPRESSION: Positive for deep venous thrombosis in the peroneal vein of the right calf. Electronically Signed   By: Paulina Fusi M.D.   On: 09/24/2016 10:31     Assessment and plan- Patient is a 31 y.o. female G2P2L2, h/o provoked PE, possible protein S deficiency in her brother found to have post partum RLE DVT  Given that patient had second episode of venous thrombosis and along with family h/o protein s deficiency in her brother- she likely needs lifelong anticoagulation. However, I will defer this discussion to Dr. Donneta Romberg who is her primary hematologist and will see her in September  For her acute postpartum RLE DVT- she needs therapeutic anticoagulation for 3 months atleast. She can continue lovenox or switch to coumadin. No NOAC given that she is breastfeeding. I discussed risks and benefits of  coumadin with her including all but not limited to need for INR checks, following consistent diet etc. Patient understands and would like to continue lovenox at this point.   Hypercoagulable work up for protein S deficiency cannot be performed while on anticoagulation  rtc in 3 months as scheduled with Dr. Donneta Romberg   Visit Diagnosis 1. Deep vein thrombosis (DVT), postpartum      Dr. Owens Shark, MD, MPH [facility] Pager- [phone] 10/09/2016 12:15 PM

## 2016-10-09 NOTE — Progress Notes (Signed)
Here for follow up

## 2016-11-08 ENCOUNTER — Ambulatory Visit: Payer: No Typology Code available for payment source | Admitting: Internal Medicine

## 2017-01-09 ENCOUNTER — Inpatient Hospital Stay: Payer: No Typology Code available for payment source | Attending: Internal Medicine | Admitting: Internal Medicine

## 2017-01-09 VITALS — BP 111/73 | HR 59 | Temp 98.3°F | Resp 20 | Ht 65.0 in | Wt 235.9 lb

## 2017-01-09 DIAGNOSIS — Z8051 Family history of malignant neoplasm of kidney: Secondary | ICD-10-CM | POA: Diagnosis not present

## 2017-01-09 DIAGNOSIS — Z86711 Personal history of pulmonary embolism: Secondary | ICD-10-CM | POA: Diagnosis not present

## 2017-01-09 DIAGNOSIS — F329 Major depressive disorder, single episode, unspecified: Secondary | ICD-10-CM

## 2017-01-09 DIAGNOSIS — Z79899 Other long term (current) drug therapy: Secondary | ICD-10-CM

## 2017-01-09 DIAGNOSIS — D6859 Other primary thrombophilia: Secondary | ICD-10-CM | POA: Diagnosis not present

## 2017-01-09 DIAGNOSIS — Z7901 Long term (current) use of anticoagulants: Secondary | ICD-10-CM | POA: Diagnosis not present

## 2017-01-09 DIAGNOSIS — Z86718 Personal history of other venous thrombosis and embolism: Secondary | ICD-10-CM | POA: Diagnosis not present

## 2017-01-09 NOTE — Assessment & Plan Note (Addendum)
Prior history of PE [provoked-long car ride/BCPs]. Patient with DVT of the left lower extremity [below-knee]- in June 2018 [approximately 10 weeks postpartum; patient had stopped taking Lovenox 4 weeks prior]. Patient continues to be on Lovenox at this time- approximately 3 months since DVT in June 2018.   # I would recommend stopping Lovenox at this time. Recommend hypercoagulable workup- in approximately 4 weeks from now; follow-up with me in 2 weeks to review the blood work; and length of anticoagulation.   # 25 minutes face-to-face with the patient discussing the above plan of care; more than 50% of time spent on prognosis/ natural history; counseling and coordination.

## 2017-01-09 NOTE — Progress Notes (Signed)
[facility] CONSULT NOTE  Patient Care Team: Duanne Limerick, MD as PCP - General (Family Medicine)  CHIEF COMPLAINTS/PURPOSE OF CONSULTATION:   # June 2018- LE DVT [below knee; appx 10 weeks post partum]- on Lovenox; stop Sep 19th 2018;   # 2013 SEP- Pulmonary embolus [BCPs/long car trip]- xarelto x 6 M; Brother Protein S deficiency.    No history exists.     HISTORY OF PRESENTING ILLNESS:  Michelle Hogan 31 y.o.  female with a recent history of left lower extremity DVT ~ approximately 10 weeks postpartum. Patient had stopped Lovenox approximately 4 weeks prior- which was part of the protocol given her prior history of PE/family history of protein S deficiency.  Patient is currently on Lovenox twice a day; interested in coming off anti-coagulation if possible. No new blood clots. Denies any leg cramps or shortness of breath or cough. She is interested in 1 more pregnancy.  ROS: A complete 10 point review of system is done which is negative except mentioned above in history of present illness  MEDICAL HISTORY:  Past Medical History:  Diagnosis Date  . Depression   . DVT (deep venous thrombosis) (HCC)   . PE (pulmonary embolism)     SURGICAL HISTORY: Past Surgical History:  Procedure Laterality Date  . ANKLE SURGERY Left   . CESAREAN SECTION N/A 07/17/2016   Procedure: C-Section;  Surgeon: Elenora Fender Ward, MD;  Location: [facility] ORS;  Service: Obstetrics;  Laterality: N/A;    SOCIAL HISTORY: no smoking; stay home mom; in [place]. Rare alcohol Social History   Social History  . Marital status: Married    Spouse name: N/A  . Number of children: N/A  . Years of education: N/A   Occupational History  . Not on file.   Social History Main Topics  . Smoking status: Never Smoker  . Smokeless tobacco: Never Used  . Alcohol use 0.6 oz/week    1 Cans of beer per week     Comment: rarely  . Drug use: No  . Sexual activity: Yes    Birth control/ protection: IUD    Other Topics Concern  . Not on file   Social History Narrative  . No narrative on file    FAMILY HISTORY: Family History  Problem Relation Age of Onset  . Hyperlipidemia Mother   . Depression Mother   . Heart attack Father   . Heart disease Father   . Deep vein thrombosis Brother   . Diabetes Maternal Grandfather   . Bladder Cancer Maternal Grandfather     ALLERGIES:  has No Known Allergies.  MEDICATIONS:  Current Outpatient Prescriptions  Medication Sig Dispense Refill  . enoxaparin (LOVENOX) 100 MG/ML injection Inject 1 mL (100 mg total) into the skin every 12 (twelve) hours. 60 Syringe 2  . levonorgestrel (MIRENA) 20 MCG/24HR IUD by Intrauterine route.     No current facility-administered medications for this visit.       Marland Kitchen  PHYSICAL EXAMINATION: ECOG PERFORMANCE STATUS: 0 - Asymptomatic  Vitals:   01/09/17 1101  BP: 111/73  Pulse: (!) 59  Resp: 20  Temp: 98.3 F (36.8 C)   Filed Weights   01/09/17 1101  Weight: 235 lb 14.3 oz (107 kg)    GENERAL: Well-nourished well-developed; Alert, no distress and comfortable.  Accompanied by her 93-month-old son. EYES: no pallor or icterus OROPHARYNX: no thrush or ulceration; good dentition  NECK: supple, no masses felt LYMPH:  no palpable lymphadenopathy in the  cervical, axillary or inguinal regions LUNGS: clear to auscultation and  No wheeze or crackles HEART/CVS: regular rate & rhythm and no murmurs; No lower extremity edema ABDOMEN: abdomen soft, non-tender and normal bowel sounds Musculoskeletal:no cyanosis of digits and no clubbing  PSYCH: alert & oriented x 3 with fluent speech NEURO: no focal motor/sensory deficits SKIN:  no rashes or significant lesions  LABORATORY DATA:  I have reviewed the data as listed Lab Results  Component Value Date   WBC 5.5 10/05/2016   HGB 13.0 10/05/2016   HCT 39.1 10/05/2016   MCV 85.3 10/05/2016   PLT 275 10/05/2016    Recent Labs  10/01/16 1451  NA 140  K  4.0  CL 107  CO2 26  GLUCOSE 104*  BUN 12  CREATININE 0.74  CALCIUM 9.4  GFRNONAA >60  GFRAA >60  PROT 7.7  ALBUMIN 4.5  AST 21  ALT 19  ALKPHOS 74  BILITOT 0.3    RADIOGRAPHIC STUDIES: I have personally reviewed the radiological images as listed and agreed with the findings in the report. No results found.  ASSESSMENT & PLAN:   Primary hypercoagulable state (HCC) Prior history of PE [provoked-long car ride/BCPs]. Patient with DVT of the left lower extremity [below-knee]- in June 2018 [approximately 10 weeks postpartum; patient had stopped taking Lovenox 4 weeks prior]. Patient continues to be on Lovenox at this time- approximately 3 months since DVT in June 2018.   # I would recommend stopping Lovenox at this time. Recommend hypercoagulable workup- in approximately 4 weeks from now; follow-up with me in 2 weeks to review the blood work; and length of anticoagulation.  # I would recommend checking protein C and protein S; antithrombin III- 3-6 months post delivery.   All questions were answered. The patient knows to call the clinic with any problems, questions or concerns.     Earna Coder, MD 01/09/2017 3:18 PM

## 2017-01-09 NOTE — Patient Instructions (Signed)
STOP LOVENOX today;

## 2017-02-06 ENCOUNTER — Inpatient Hospital Stay: Payer: No Typology Code available for payment source | Attending: Internal Medicine

## 2017-02-06 ENCOUNTER — Other Ambulatory Visit: Payer: Self-pay

## 2017-02-06 DIAGNOSIS — Z86718 Personal history of other venous thrombosis and embolism: Secondary | ICD-10-CM | POA: Diagnosis present

## 2017-02-06 DIAGNOSIS — D6859 Other primary thrombophilia: Secondary | ICD-10-CM

## 2017-02-06 LAB — FIBRIN DERIVATIVES D-DIMER (ARMC ONLY): FIBRIN DERIVATIVES D-DIMER (ARMC): 1018.11 ng{FEU}/mL — AB (ref 0.00–499.00)

## 2017-02-07 LAB — PROTEIN C ACTIVITY: Protein C Activity: 134 % (ref 73–180)

## 2017-02-07 LAB — ANTITHROMBIN III: AntiThromb III Func: 114 % (ref 75–120)

## 2017-02-07 LAB — PROTEIN S ACTIVITY: PROTEIN S ACTIVITY: 13 % — AB (ref 63–140)

## 2017-02-07 LAB — PROTEIN S, TOTAL: Protein S Ag, Total: 40 % — ABNORMAL LOW (ref 60–150)

## 2017-02-08 LAB — PROTEIN C, TOTAL: Protein C, Total: 112 % (ref 60–150)

## 2017-02-09 LAB — ANTIPHOSPHOLIPID SYNDROME PROF
Anticardiolipin IgM: 10 MPL U/mL (ref 0–12)
DRVVT: 39.2 s (ref 0.0–47.0)
PTT Lupus Anticoagulant: 35.2 s (ref 0.0–51.9)

## 2017-02-20 ENCOUNTER — Ambulatory Visit: Payer: No Typology Code available for payment source | Admitting: Internal Medicine

## 2017-02-27 ENCOUNTER — Inpatient Hospital Stay: Payer: No Typology Code available for payment source | Attending: Internal Medicine | Admitting: Internal Medicine

## 2017-02-27 VITALS — BP 115/82 | HR 73 | Temp 96.6°F | Resp 18 | Wt 241.1 lb

## 2017-02-27 DIAGNOSIS — Z86711 Personal history of pulmonary embolism: Secondary | ICD-10-CM

## 2017-02-27 DIAGNOSIS — D6859 Other primary thrombophilia: Secondary | ICD-10-CM | POA: Diagnosis not present

## 2017-02-27 DIAGNOSIS — Z8052 Family history of malignant neoplasm of bladder: Secondary | ICD-10-CM | POA: Insufficient documentation

## 2017-02-27 DIAGNOSIS — Z79899 Other long term (current) drug therapy: Secondary | ICD-10-CM | POA: Diagnosis not present

## 2017-02-27 DIAGNOSIS — F329 Major depressive disorder, single episode, unspecified: Secondary | ICD-10-CM | POA: Insufficient documentation

## 2017-02-27 DIAGNOSIS — Z86718 Personal history of other venous thrombosis and embolism: Secondary | ICD-10-CM | POA: Diagnosis not present

## 2017-02-27 MED ORDER — ENOXAPARIN SODIUM 100 MG/ML ~~LOC~~ SOLN: 100.0000 mg | SUBCUTANEOUS | 4 refills | Status: DC

## 2017-02-27 NOTE — Progress Notes (Signed)
Here for follow up. Pt stated " I am  doing great "

## 2017-02-27 NOTE — Assessment & Plan Note (Addendum)
Prior history of PE [provoked-long car ride/BCPs]; and also LE [below knee DVT- during post partum]; but patient's blood work suggestive of protein S deficiency [low antigen levels-and activity]; patient's d-dimer is elevated at 1000 [workup done after stopping Lovenox].  # At a long discussion the patient regarding the pros and cons of restarting anti-coagulation. Given the protein S deficiency- and prior history of blood clots [even though provoked]; I would recommend restarting anticoagulation. Patient is currently breast-feeding; I recommend starting Lovenox 100 mg subcutaneous once daily.  # Once breast-feeding is done [approximately 6 months from now]- I think it's reasonable to switch over to the oral novel anticoagulants- xarelto vs- eliquis.   # Patient is interested in one more pregnancy- when planning she will inform us- we will switch her over to Lovenox again.  # Discussed regarding trying to lose weight- which might help her to prevent blood clot [only other modifiable risk factor for the patient].   # follow up in 6 months/labs.  # 25 minutes face-to-face with the patient discussing the above plan of care; more than 50% of time spent on prognosis/ natural history; counseling and coordination.

## 2017-02-27 NOTE — Progress Notes (Signed)
[facility] CONSULT NOTE  Patient Care Team: Duanne LimerickJones, Deanna C, MD as PCP - General (Family Medicine)  CHIEF COMPLAINTS/PURPOSE OF CONSULTATION:   # June 2018- LE DVT [below knee; appx 10 weeks post partum]- on Lovenox; stop Sep 19th 2018; OCT 2018- PROTEIN S/ Activity- LOW; NOV 11th 2018- Re-START Lovenox 100 mg SQ/day  # 2013 SEP- Pulmonary embolus [BCPs/long car trip]- xarelto x 6 M; Brother Protein S deficiency.    No history exists.     HISTORY OF PRESENTING ILLNESS:  Lenox Pondsmanda D Bruni 31 y.o.  female with above history of PE in 2013; and also  history of left lower extremity DVT ~ approximately 10 weeks postpartum [June 2018]. Patient had stopped Lovenox approximately 4 weeks prior- which was part of the protocol given her prior history of PE/family history of protein S deficiency.  Patient has been off anticoagulation for approximately 2 months now- she is here to review the results of her blood work/next plan of care.  No new blood clots. Denies any leg cramps or shortness of breath or cough. She is interested in 1 more pregnancy.  ROS: A complete 10 point review of system is done which is negative except mentioned above in history of present illness  MEDICAL HISTORY:  Past Medical History:  Diagnosis Date  . Depression   . DVT (deep venous thrombosis) (HCC)   . PE (pulmonary embolism)     SURGICAL HISTORY: Past Surgical History:  Procedure Laterality Date  . ANKLE SURGERY Left     SOCIAL HISTORY: no smoking; stay home mom; in [place]. Rare alcohol Social History   Socioeconomic History  . Marital status: Married    Spouse name: Not on file  . Number of children: Not on file  . Years of education: Not on file  . Highest education level: Not on file  Social Needs  . Financial resource strain: Not on file  . Food insecurity - worry: Not on file  . Food insecurity - inability: Not on file  . Transportation needs - medical: Not on file  .  Transportation needs - non-medical: Not on file  Occupational History  . Not on file  Tobacco Use  . Smoking status: Never Smoker  . Smokeless tobacco: Never Used  Substance and Sexual Activity  . Alcohol use: Yes    Alcohol/week: 0.6 oz    Types: 1 Cans of beer per week    Comment: rarely  . Drug use: No  . Sexual activity: Yes    Birth control/protection: IUD  Other Topics Concern  . Not on file  Social History Narrative  . Not on file    FAMILY HISTORY: mom's cousin [29 died on blood clots]; brother blood clot; mom- no blood clots. Family History  Problem Relation Age of Onset  . Hyperlipidemia Mother   . Depression Mother   . Heart attack Father   . Heart disease Father   . Deep vein thrombosis Brother   . Diabetes Maternal Grandfather   . Bladder Cancer Maternal Grandfather     ALLERGIES:  has No Known Allergies.  MEDICATIONS:  Current Outpatient Medications  Medication Sig Dispense Refill  . escitalopram (LEXAPRO) 10 MG tablet Take by mouth.    . levonorgestrel (MIRENA) 20 MCG/24HR IUD by Intrauterine route.    . enoxaparin (LOVENOX) 100 MG/ML injection Inject 1 mL (100 mg total) daily into the skin. 60 Syringe 4  . IRON PO Take by mouth.     No current  facility-administered medications for this visit.       Marland Kitchen.  PHYSICAL EXAMINATION: ECOG PERFORMANCE STATUS: 0 - Asymptomatic  Vitals:   02/27/17 1123  BP: 115/82  Pulse: 73  Resp: 18  Temp: (!) 96.6 F (35.9 C)   Filed Weights   02/27/17 1123  Weight: 241 lb 1.2 oz (109.4 kg)    GENERAL: Well-nourished well-developed; Alert, no distress and comfortable. EYES: no pallor or icterus OROPHARYNX: no thrush or ulceration; good dentition  NECK: supple, no masses felt LYMPH:  no palpable lymphadenopathy in the cervical, axillary or inguinal regions LUNGS: clear to auscultation and  No wheeze or crackles HEART/CVS: regular rate & rhythm and no murmurs; No lower extremity edema ABDOMEN: abdomen soft,  non-tender and normal bowel sounds Musculoskeletal:no cyanosis of digits and no clubbing  PSYCH: alert & oriented x 3 with fluent speech NEURO: no focal motor/sensory deficits SKIN:  no rashes or significant lesions  LABORATORY DATA:  I have reviewed the data as listed Lab Results  Component Value Date   WBC 5.5 10/05/2016   HGB 13.0 10/05/2016   HCT 39.1 10/05/2016   MCV 85.3 10/05/2016   PLT 275 10/05/2016   Recent Labs    10/01/16 1451  NA 140  K 4.0  CL 107  CO2 26  GLUCOSE 104*  BUN 12  CREATININE 0.74  CALCIUM 9.4  GFRNONAA >60  GFRAA >60  PROT 7.7  ALBUMIN 4.5  AST 21  ALT 19  ALKPHOS 74  BILITOT 0.3    RADIOGRAPHIC STUDIES: I have personally reviewed the radiological images as listed and agreed with the findings in the report. No results found.  ASSESSMENT & PLAN:   Primary hypercoagulable state (HCC) Prior history of PE [provoked-long car ride/BCPs]; and also LE [below knee DVT- during post partum]; but patient's blood work suggestive of protein S deficiency [low antigen levels-and activity]; patient's d-dimer is elevated at 1000 [workup done after stopping Lovenox].  # At a long discussion the patient regarding the pros and cons of restarting anti-coagulation. Given the protein S deficiency- and prior history of blood clots [even though provoked]; I would recommend restarting anticoagulation. Patient is currently breast-feeding; I recommend starting Lovenox 100 mg subcutaneous once daily.  # Once breast-feeding is done [approximately 6 months from now]- I think it's reasonable to switch over to the oral novel anticoagulants- xarelto vs- eliquis.   # Patient is interested in one more pregnancy- when planning she will inform us- we will switch her over to Lovenox again.  # Discussed regarding trying to lose weight- which might help her to prevent blood clot [only other modifiable risk factor for the patient].   # follow up in 6 months/labs.  # 25  minutes face-to-face with the patient discussing the above plan of care; more than 50% of time spent on prognosis/ natural history; counseling and coordination.  All questions were answered. The patient knows to call the clinic with any problems, questions or concerns.     Earna CoderGovinda R Brahmanday, MD 02/27/2017 2:08 PM

## 2017-04-13 ENCOUNTER — Emergency Department
Admission: EM | Admit: 2017-04-13 | Discharge: 2017-04-14 | Disposition: A | Discharge status: Home / Self Care | Payer: No Typology Code available for payment source | Attending: Emergency Medicine | Admitting: Emergency Medicine

## 2017-04-13 ENCOUNTER — Encounter: Payer: Self-pay | Admitting: *Deleted

## 2017-04-13 DIAGNOSIS — Z79899 Other long term (current) drug therapy: Secondary | ICD-10-CM | POA: Diagnosis not present

## 2017-04-13 DIAGNOSIS — R1010 Upper abdominal pain, unspecified: Secondary | ICD-10-CM | POA: Insufficient documentation

## 2017-04-13 LAB — CBC
HCT: 37.6 % (ref 35.0–47.0)
Hemoglobin: 12.8 g/dL (ref 12.0–16.0)
MCH: 29 pg (ref 26.0–34.0)
MCHC: 34.1 g/dL (ref 32.0–36.0)
MCV: 85.1 fL (ref 80.0–100.0)
Platelets: 275 10*3/uL (ref 150–440)
RBC: 4.42 MIL/uL (ref 3.80–5.20)
RDW: 13.5 % (ref 11.5–14.5)
WBC: 8.4 10*3/uL (ref 3.6–11.0)

## 2017-04-13 LAB — LIPASE, BLOOD: Lipase: 28 U/L (ref 11–51)

## 2017-04-13 LAB — COMPREHENSIVE METABOLIC PANEL
ALBUMIN: 4.1 g/dL (ref 3.5–5.0)
ALK PHOS: 88 U/L (ref 38–126)
ALT: 17 U/L (ref 14–54)
ANION GAP: 7 (ref 5–15)
AST: 22 U/L (ref 15–41)
BILIRUBIN TOTAL: 0.3 mg/dL (ref 0.3–1.2)
BUN: 17 mg/dL (ref 6–20)
CALCIUM: 8.9 mg/dL (ref 8.9–10.3)
CO2: 22 mmol/L (ref 22–32)
Chloride: 108 mmol/L (ref 101–111)
Creatinine, Ser: 0.81 mg/dL (ref 0.44–1.00)
GFR calc non Af Amer: >60 mL/min (ref >60)
GLUCOSE: 96 mg/dL (ref 65–99)
POTASSIUM: 3.8 mmol/L (ref 3.5–5.1)
SODIUM: 137 mmol/L (ref 135–145)
TOTAL PROTEIN: 7.4 g/dL (ref 6.5–8.1)

## 2017-04-13 MED ORDER — FENTANYL CITRATE (PF) 100 MCG/2ML IJ SOLN
50.0000 ug | Freq: Once | INTRAMUSCULAR | Status: AC
  Administered 2017-04-14: 50 ug via INTRAVENOUS
  Filled 2017-04-13: qty 2

## 2017-04-13 MED ORDER — SODIUM CHLORIDE 0.9 % IV BOLUS (SEPSIS)
500.0000 mL | Freq: Once | INTRAVENOUS | Status: AC
  Administered 2017-04-14: 500 mL via INTRAVENOUS

## 2017-04-13 MED ORDER — ONDANSETRON HCL 4 MG/2ML IJ SOLN
4.0000 mg | Freq: Once | INTRAMUSCULAR | Status: AC
  Administered 2017-04-14: 4 mg via INTRAVENOUS
  Filled 2017-04-13: qty 2

## 2017-04-13 NOTE — ED Provider Notes (Signed)
[facility] Emergency Department Provider Note   ____________________________________________   First MD Initiated Contact with Patient 04/13/17 2314     (approximate)  I have reviewed the triage vital signs and the nursing notes.   HISTORY  Chief Complaint Abdominal Pain    HPI Michelle Hogan is a 31 y.o. female who presents to the ED from home with a chief complaint of abdominal pain.  Patient has a history of low functioning gallbladder which was evaluated by a surgeon 2 years ago.  At that time, her symptoms quickly abated and also they decided against surgery given she is on anticoagulation for protein S deficiency.  States she has been doing fine until recently.  Complains of several "gallbladder attacks" this evening.  Latest one has lasted the longest, hurting since 6 PM.  Food seems to exacerbate it this evening.  Denies associated fever, chills, chest pain, shortness of breath, nausea, vomiting.  Patient takes Lovenox and is currently breast-feeding.   Past Medical History:  Diagnosis Date  . Depression   . DVT (deep venous thrombosis) (HCC)   . PE (pulmonary embolism)     Patient Active Problem List   Diagnosis Date Noted  . Labor and delivery, indication for care 07/17/2016  . Primary hypercoagulable state (HCC) 01/04/2016  . High risk pregnancy, antepartum 12/20/2015  . Major depressive disorder, single episode, moderate (HCC) 08/18/2015  . Hyperlipidemia 08/12/2015  . FH: heart disease 08/12/2015  . Postpartum depression 08/12/2015  . Vitamin D deficiency 07/14/2015  . Obesity, Class II, BMI 35-39.9 07/12/2015  . Dysfunctional gallbladder 07/12/2015  . Hx pulmonary embolism 01/27/2015    Past Surgical History:  Procedure Laterality Date  . ANKLE SURGERY Left   . CESAREAN SECTION N/A 07/17/2016   Procedure: C-Section;  Surgeon: Elenora Fenderhelsea C Ward, MD;  Location: [facility] ORS;  Service: Obstetrics;  Laterality: N/A;    Prior to  Admission medications   Medication Sig Start Date End Date Taking? Authorizing Provider  enoxaparin (LOVENOX) 100 MG/ML injection Inject 1 mL (100 mg total) daily into the skin. 02/27/17   Earna CoderBrahmanday, Govinda R, MD  escitalopram (LEXAPRO) 10 MG tablet Take by mouth. 01/23/17   [provider]  IRON PO Take by mouth.    [provider]  levonorgestrel (MIRENA) 20 MCG/24HR IUD by Intrauterine route.    [provider]    Allergies Patient has no known allergies.  Family History  Problem Relation Age of Onset  . Hyperlipidemia Mother   . Depression Mother   . Heart attack Father   . Heart disease Father   . Deep vein thrombosis Brother   . Diabetes Maternal Grandfather   . Bladder Cancer Maternal Grandfather     Social History Social History   Tobacco Use  . Smoking status: Never Smoker  . Smokeless tobacco: Never Used  Substance Use Topics  . Alcohol use: Yes    Alcohol/week: 0.6 oz    Types: 1 Cans of beer per week    Comment: rarely  . Drug use: No    Review of Systems  Constitutional: No fever/chills. Eyes: No visual changes. ENT: No sore throat. Cardiovascular: Denies chest pain. Respiratory: Denies shortness of breath. Gastrointestinal: Positive for abdominal pain.  No nausea, no vomiting.  No diarrhea.  No constipation. Genitourinary: Negative for dysuria. Musculoskeletal: Negative for back pain. Skin: Negative for rash. Neurological: Negative for headaches, focal weakness or numbness.   ____________________________________________   PHYSICAL EXAM:  VITAL SIGNS: ED  Triage Vitals  Enc Vitals Group     BP 04/13/17 2200 122/67     Pulse Rate 04/13/17 2200 74     Resp 04/13/17 2200 20     Temp 04/13/17 2200 98.3 F (36.8 C)     Temp Source 04/13/17 2200 Oral     SpO2 04/13/17 2200 99 %     Weight 04/13/17 2202 240 lb (108.9 kg)     Height 04/13/17 2202 5\' 5"  (1.651 m)     Head Circumference --      Peak Flow --      Pain  Score 04/13/17 2242 5     Pain Loc --      Pain Edu? --      Excl. in GC? --     Constitutional: Alert and oriented. Well appearing and in no acute distress. Eyes: Conjunctivae are normal. PERRL. EOMI. Head: Atraumatic. Nose: No congestion/rhinnorhea. Mouth/Throat: Mucous membranes are moist.  Oropharynx non-erythematous. Neck: No stridor.   Cardiovascular: Normal rate, regular rhythm. Grossly normal heart sounds.  Good peripheral circulation. Respiratory: Normal respiratory effort.  No retractions. Lungs CTAB. Gastrointestinal: Soft and mildly tender to palpation epigastrium without rebound or guarding. No distention. No abdominal bruits. No CVA tenderness. Musculoskeletal: No lower extremity tenderness nor edema.  No joint effusions. Neurologic:  Normal speech and language. No gross focal neurologic deficits are appreciated. No gait instability. Skin:  Skin is warm, dry and intact. No rash noted. Psychiatric: Mood and affect are normal. Speech and behavior are normal.  ____________________________________________   LABS (all labs ordered are listed, but only abnormal results are displayed)  Labs Reviewed  CBC  COMPREHENSIVE METABOLIC PANEL  LIPASE, BLOOD  URINALYSIS, ROUTINE W REFLEX MICROSCOPIC   ____________________________________________  EKG  ED ECG REPORT I, Manish Ruggiero J, the attending physician, personally viewed and interpreted this ECG.   Date: 04/13/2017  EKG Time: 2228  Rate: 58  Rhythm: normal EKG, normal sinus rhythm  Axis: Normal  Intervals:none  ST&T Change: Nonspecific  ____________________________________________  RADIOLOGY  [place] Abdomen Limited Ruq  Result Date: 04/14/2017 CLINICAL DATA:  Initial evaluation for acute epigastric pain. EXAM: ULTRASOUND ABDOMEN LIMITED RIGHT UPPER QUADRANT COMPARISON:  Prior ultrasound from 05/29/2014. FINDINGS: Gallbladder: Small amount of sludge within the gallbladder lumen. No frank echogenic stones. Gallbladder  wall measure within normal limits at 2 mm. No free pericholecystic fluid. No sonographic Murphy sign elicited on exam. Common bile duct: Diameter: 5.2 mm Liver: No focal lesion identified. Within normal limits in parenchymal echogenicity. Portal vein is patent on color Doppler imaging with normal direction of blood flow towards the liver. IMPRESSION: 1. Gallbladder sludge without cholelithiasis or additional sonographic features to suggest acute cholecystitis. 2. No biliary dilatation. Electronically Signed   By: Rise MuBenjamin  McClintock M.D.   On: 04/14/2017 00:40    ____________________________________________   PROCEDURES  Procedure(s) performed: None  Procedures  Critical Care performed: No  ____________________________________________   INITIAL IMPRESSION / ASSESSMENT AND PLAN / ED COURSE  As part of my medical decision making, I reviewed the following data within the electronic MEDICAL RECORD NUMBER History obtained from family, Nursing notes reviewed and incorporated, Labs reviewed, EKG interpreted, Old chart reviewed, Radiograph reviewed and Notes from prior ED visits.   31 year old female with a history of low functioning gallbladder who presents with epigastric abdominal pain without associated symptoms. Differential diagnosis includes, but is not limited to, biliary disease (biliary colic, acute cholecystitis, cholangitis, choledocholithiasis, etc), intrathoracic causes for epigastric abdominal pain including ACS, gastritis,  duodenitis, pancreatitis, small bowel or large bowel obstruction, abdominal aortic aneurysm, hernia, and gastritis.  Patient is on Lovenox for protein S deficiency as well as currently breast-feeding.  Laboratory results unremarkable.  Will administer analgesia with antiemetic, and obtain right upper quadrant abdominal ultrasound.  Clinical Course as of Apr 14 54  Sat Apr 14, 2017  0052 Pain-free currently.  Updated patient and spouse of ultrasound results.  Will  discharge home with prescriptions for analgesia, antiemetic and patient will follow-up with surgery next week.  Strict return precautions given.  Both verbalize understanding and agree with plan of care.  [JS]    Clinical Course User Index [JS] Irean Hong, MD     ____________________________________________   FINAL CLINICAL IMPRESSION(S) / ED DIAGNOSES  Final diagnoses:  Pain of upper abdomen     ED Discharge Orders    None       Note:  This document was prepared using Dragon voice recognition software and may include unintentional dictation errors.    Irean Hong, MD 04/14/17 [phone]

## 2017-04-13 NOTE — ED Triage Notes (Signed)
Pt is on Levonox for blood clotting. Has had not had dose today

## 2017-04-13 NOTE — ED Notes (Signed)
ED Provider at bedside. 

## 2017-04-13 NOTE — ED Triage Notes (Signed)
Gall Bladder attack per patients. Pt states she has a low functioning gall bladder and on call MD sent her to hospital for evaluation. Pt state she has had several gall bladder attacks today. This episode has been 4 hours

## 2017-04-13 NOTE — ED Notes (Signed)
Patient c/o upper medial abdominal pain described as an ache. Patient reports a history of gallbladder attacks (3 years ago) and protein S deficiency clotting disorder. Patient is on Lovenox for clotting disorder as she is still breastfeeding (delivered March 2018).  Patient denies emesis. Patient reports 1 loose stool today, however, reports there was some solid form to said stool.  Patient is not tender to palpation of abdomen. Patient reports earlier episode of nausea with onset of pain that has since resolved.

## 2017-04-14 ENCOUNTER — Emergency Department: Payer: No Typology Code available for payment source

## 2017-04-14 MED ORDER — OXYCODONE-ACETAMINOPHEN 5-325 MG PO TABS: 1.0000 | ORAL_TABLET | ORAL | 0 refills | Status: DC | PRN

## 2017-04-14 MED ORDER — ONDANSETRON 4 MG PO TBDP: 4.0000 mg | ORAL_TABLET | Freq: Three times a day (TID) | ORAL | 0 refills | Status: DC | PRN

## 2017-04-14 NOTE — ED Notes (Signed)
Reviewed discharge instructions, follow-up care, and prescriptions with patient. Patient verbalized understanding of all information reviewed. Patient stable, with no distress noted at this time.    

## 2017-04-14 NOTE — ED Notes (Signed)
Patient transported to Ultrasound 

## 2017-04-14 NOTE — Discharge Instructions (Signed)
1.  You may take medicines as needed for pain and nausea (Percocet/Zofran #30).  Pump and dump the first ounce of breastmilk if you are taking the Percocet. 2.  Eat a bland diet until seen by the surgeon.  Avoid fatty, greasy, spicy foods and alcohol. 3.  Return to the ER for worsening symptoms, persistent vomiting, difficulty breathing or other concerns.

## 2017-04-19 DIAGNOSIS — Z86718 Personal history of other venous thrombosis and embolism: Secondary | ICD-10-CM | POA: Insufficient documentation

## 2017-04-19 DIAGNOSIS — I2699 Other pulmonary embolism without acute cor pulmonale: Secondary | ICD-10-CM | POA: Insufficient documentation

## 2017-04-19 DIAGNOSIS — I82409 Acute embolism and thrombosis of unspecified deep veins of unspecified lower extremity: Secondary | ICD-10-CM | POA: Insufficient documentation

## 2017-04-20 ENCOUNTER — Telehealth: Payer: Self-pay | Admitting: Surgery

## 2017-04-20 ENCOUNTER — Other Ambulatory Visit: Payer: Self-pay

## 2017-04-20 ENCOUNTER — Encounter: Payer: Self-pay | Admitting: Surgery

## 2017-04-20 ENCOUNTER — Ambulatory Visit: Payer: PRIVATE HEALTH INSURANCE | Admitting: Surgery

## 2017-04-20 ENCOUNTER — Encounter
Admission: RE | Admit: 2017-04-20 | Discharge: 2017-04-20 | Disposition: A | Discharge status: Home / Self Care | Payer: No Typology Code available for payment source | Source: Ambulatory Visit | Attending: Surgery | Admitting: Surgery

## 2017-04-20 ENCOUNTER — Inpatient Hospital Stay: Admission: RE | Admit: 2017-04-20 | Payer: No Typology Code available for payment source | Source: Ambulatory Visit

## 2017-04-20 VITALS — BP 124/84 | HR 82 | Temp 98.2°F | Ht 65.0 in | Wt 247.8 lb

## 2017-04-20 DIAGNOSIS — K8050 Calculus of bile duct without cholangitis or cholecystitis without obstruction: Secondary | ICD-10-CM

## 2017-04-20 DIAGNOSIS — Z79899 Other long term (current) drug therapy: Secondary | ICD-10-CM | POA: Diagnosis not present

## 2017-04-20 DIAGNOSIS — D6859 Other primary thrombophilia: Secondary | ICD-10-CM | POA: Diagnosis not present

## 2017-04-20 DIAGNOSIS — K8010 Calculus of gallbladder with chronic cholecystitis without obstruction: Secondary | ICD-10-CM | POA: Diagnosis present

## 2017-04-20 DIAGNOSIS — Z86711 Personal history of pulmonary embolism: Secondary | ICD-10-CM | POA: Diagnosis not present

## 2017-04-20 DIAGNOSIS — Z7901 Long term (current) use of anticoagulants: Secondary | ICD-10-CM | POA: Diagnosis not present

## 2017-04-20 DIAGNOSIS — F329 Major depressive disorder, single episode, unspecified: Secondary | ICD-10-CM | POA: Diagnosis not present

## 2017-04-20 DIAGNOSIS — Z86718 Personal history of other venous thrombosis and embolism: Secondary | ICD-10-CM | POA: Diagnosis not present

## 2017-04-20 HISTORY — DX: Other primary thrombophilia: D68.59

## 2017-04-20 NOTE — Telephone Encounter (Signed)
Pt advised of pre op date/time and sx date. Sx: 04/23/17 with Dr Mamie Laurelavis-Laparoscopic cholecystectomy.  Pre op: 04/20/17.   Patient made aware to call [phone], between 1-3:00pm the day before surgery, to find out what time to arrive.

## 2017-04-20 NOTE — Patient Instructions (Signed)
Your procedure is scheduled on: Monday 04/23/17 Report to DAY SURGERY. 2ND FLOOR MEDICAL MALL ENTRANCE. To find out your arrival time please call [phone] between 1PM - 3PM on Today.  Remember: Instructions that are not followed completely may result in serious medical risk, up to and including death, or upon the discretion of your surgeon and anesthesiologist your surgery may need to be rescheduled.    __X__ 1. Do not eat anything after midnight the night before your    procedure.  No gum chewing or hard candies.  You may drink clear   liquids up to 2 hours before you are scheduled to arrive at the   hospital for your procedure. Do not drink clear liquids within 2   hours of scheduled arrival to the hospital as this may lead to your   procedure being delayed or rescheduled.       Clear liquids include:   Water or Apple juice without pulp   Clear carbohydrate beverage such as Clearfast or Gatorade   Black coffee or Clear Tea (no milk, no creamer, do not add anything   to the coffee or tea)    Diabetics should only drink water   __X__ 2. No Alcohol for 24 hours before or after surgery.   ____ 3. Bring all medications with you on the day of surgery if instructed.    __X__ 4. Notify your doctor if there is any change in your medical condition     (cold, fever, infections).             __X___5. No smoking within 24 hours of your surgery.     Do not wear jewelry, make-up, hairpins, clips or nail polish.  Do not wear lotions, powders, or perfumes.   Do not shave 48 hours prior to surgery. Men may shave face and neck.  Do not bring valuables to the hospital.    [facility] belongings or valuables.               Contacts, dentures or bridgework may not be worn into surgery.  Leave your suitcase in the car. After surgery it may be brought to your room.  For patients admitted to the hospital, discharge time is determined by your                treatment  team.   Patients discharged the day of surgery will not be allowed to drive home.   Please read over the following fact sheets that you were given:   MRSA Information   __X__ Take these medicines the morning of surgery with A SIP OF WATER:    1. LEXAPRO  2. OXYCODONE IF NEEDED  3.   4.  5.  6.  ____ Fleet Enema (as directed)   __X__ Use CHG Soap/SAGE wipes as directed  ____ Use inhalers on the day of surgery  ____ Stop metformin 2 days prior to surgery    ____ Take 1/2 of usual insulin dose the night before surgery and none on the morning of surgery.   ____ Stop Coumadin/Plavix/aspirin on   __X__ Stop Anti-inflammatories such as Advil, Aleve, Ibuprofen, Motrin, Naproxen, Naprosyn, Goodies,powder, or aspirin products.  OK to take Tylenol.   __X__ Stop supplements, Vitamin E, Fish Oil until after surgery.    ____ Bring C-Pap to the hospital.

## 2017-04-20 NOTE — H&P (View-Only) (Signed)
Patient ID: Michelle Hogan, female   DOB: 11/07/1985, 31 y.o.   MRN: [id number]  HPI Michelle Hogan is a 31 y.o. female with a history of protein S deficiency on Lovenox. Prior history of PE and DVT now lactating. As has a history of biliary dyskinesia for the last 2-3 years. First was diagnosed about 3 years ago and a HIDA was performed. Per the patient reports she states that her ejection fraction was about 2%. She never had her gallbladder taken out because of different issues including multiple DVTs and PE. She reports that now her attacks are more frequent and she has been having this and right upper quadrant pain every few days. The episode lasted hours and the pain is moderate to severe intensity located in the right upper quadrant and a sharp type. There is no specific alleviating or aggravating factors. She is able to perform more than 4 Mets of activity without any shortness of breath or chest pain. No previous issues with anticoagulation. She sees hematology a routine basis for a proteine S deficiency. I have personally reviewed her ultrasound that was done 1 week ago showing evidence of sludge. No stones. Normal common bile duct. Her LFTs, white count and creatinine are all normal   HPI  Past Medical History:  Diagnosis Date  . Depression   . DVT (deep venous thrombosis) (HCC)   . PE (pulmonary embolism)     Past Surgical History:  Procedure Laterality Date  . ANKLE SURGERY Left   . CESAREAN SECTION N/A 07/17/2016   Procedure: C-Section;  Surgeon: Chelsea C Ward, MD;  Location: [facility] ORS;  Service: Obstetrics;  Laterality: N/A;    Family History  Problem Relation Age of Onset  . Hyperlipidemia Mother   . Depression Mother   . Heart attack Father   . Heart disease Father   . Deep vein thrombosis Brother   . Diabetes Maternal Grandfather   . Bladder Cancer Maternal Grandfather     Social History Social History   Tobacco Use  . Smoking status: Never Smoker  .  Smokeless tobacco: Never Used  Substance Use Topics  . Alcohol use: Yes    Alcohol/week: 0.6 oz    Types: 1 Cans of beer per week    Comment: rarely  . Drug use: No    No Known Allergies  Current Outpatient Medications  Medication Sig Dispense Refill  . enoxaparin (LOVENOX) 100 MG/ML injection Inject 1 mL (100 mg total) daily into the skin. 60 Syringe 4  . escitalopram (LEXAPRO) 10 MG tablet Take by mouth.    . levonorgestrel (MIRENA) 20 MCG/24HR IUD by Intrauterine route.    . oxyCODONE-acetaminophen (ROXICET) 5-325 MG tablet Take 1 tablet by mouth every 4 (four) hours as needed for severe pain. 30 tablet 0   No current facility-administered medications for this visit.      Review of Systems Full ROS  was asked and was negative except for the information on the HPI  Physical Exam Blood pressure 124/84, pulse 82, temperature 98.2 F (36.8 C), temperature source Oral, height 5' 5" (1.651 m), weight 112.4 kg (247 lb 12.8 oz), currently breastfeeding. CONSTITUTIONAL:NAD. EYES: Pupils are equal, round, and reactive to light, Sclera are non-icteric. EARS, NOSE, MOUTH AND THROAT: The oropharynx is clear. The oral mucosa is pink and moist. Hearing is intact to voice. LYMPH NODES:  Lymph nodes in the neck are normal. RESPIRATORY:  Lungs are clear. There is normal respiratory effort, with equal breath sounds   bilaterally, and without pathologic use of accessory muscles. CARDIOVASCULAR: Heart is regular without murmurs, gallops, or rubs. GI: The abdomen is  soft, nontender, and nondistended. There are no palpable masses. There is no hepatosplenomegaly. There are normal bowel sounds in all quadrants. GU: Rectal deferred.   MUSCULOSKELETAL: Normal muscle strength and tone. No cyanosis or edema.   SKIN: Turgor is good and there are no pathologic skin lesions or ulcers. NEUROLOGIC: Motor and sensation is grossly normal. Cranial nerves are grossly intact. PSYCH:  Oriented to person, place and  time. Affect is normal.  Data Reviewed  I have personally reviewed the patient's imaging, laboratory findings and medical records.    Assessment/Plan 31 year old female with a history of protein S deficiency on Lovenox now with biliary colic from biliary dyskinesia and is large. Discussed with the patient in detail about her disease process and my recommendation for cholecystectomy.The risks, benefits, complications, treatment options, and expected outcomes were discussed with the patient. The possibilities of bleeding, recurrent infection, finding a normal gallbladder, perforation of viscus organs, damage to surrounding structures, bile leak, abscess formation, needing a drain placed, the need for additional procedures, reaction to medication, pulmonary aspiration,  failure to diagnose a condition, the possible need to convert to an open procedure, and creating a complication requiring transfusion or operation were discussed with the patient. The patient and/or family concurred with the proposed plan, giving informed consent. They are adamant about having surgery performed before the end of the year because they have already met tension deductibles. I have discussed the case in detail with Dr. Rosana Hoes that is on call that day and he is willing to surgery. We'll have her stop Lovenox 24 hours before the surgery.     Caroleen Hamman, MD FACS General Surgeon 04/20/2017, 9:40 AM

## 2017-04-20 NOTE — Patient Instructions (Signed)
[facility]    Please switch to morning dosage of Lovenox as discussed in the office with Dr.Pabon.  We will need for you to go to pre-admit testing when leaving our office.

## 2017-04-20 NOTE — Pre-Procedure Instructions (Signed)
Anesthesia see note from hematology 02/27/17.

## 2017-04-20 NOTE — Progress Notes (Signed)
Patient ID: Michelle Hogan, female   DOB: Mar 02, 1986, 31 y.o.   MRN: [id number]  HPI Michelle Hogan is a 31 y.o. female with a history of protein S deficiency on Lovenox. Prior history of PE and DVT now lactating. As has a history of biliary dyskinesia for the last 2-3 years. First was diagnosed about 3 years ago and a HIDA was performed. Per the patient reports she states that her ejection fraction was about 2%. She never had her gallbladder taken out because of different issues including multiple DVTs and PE. She reports that now her attacks are more frequent and she has been having this and right upper quadrant pain every few days. The episode lasted hours and the pain is moderate to severe intensity located in the right upper quadrant and a sharp type. There is no specific alleviating or aggravating factors. She is able to perform more than 4 Mets of activity without any shortness of breath or chest pain. No previous issues with anticoagulation. She sees hematology a routine basis for a proteine S deficiency. I have personally reviewed her ultrasound that was done 1 week ago showing evidence of sludge. No stones. Normal common bile duct. Her LFTs, white count and creatinine are all normal   HPI  Past Medical History:  Diagnosis Date  . Depression   . DVT (deep venous thrombosis) ([place])   . PE (pulmonary embolism)     Past Surgical History:  Procedure Laterality Date  . ANKLE SURGERY Left   . CESAREAN SECTION N/A 07/17/2016   Procedure: C-Section;  Surgeon: Honor Loh Ward, MD;  Location: [facility] ORS;  Service: Obstetrics;  Laterality: N/A;    Family History  Problem Relation Age of Onset  . Hyperlipidemia Mother   . Depression Mother   . Heart attack Father   . Heart disease Father   . Deep vein thrombosis Brother   . Diabetes Maternal Grandfather   . Bladder Cancer Maternal Grandfather     Social History Social History   Tobacco Use  . Smoking status: Never Smoker  .  Smokeless tobacco: Never Used  Substance Use Topics  . Alcohol use: Yes    Alcohol/week: 0.6 oz    Types: 1 Cans of beer per week    Comment: rarely  . Drug use: No    No Known Allergies  Current Outpatient Medications  Medication Sig Dispense Refill  . enoxaparin (LOVENOX) 100 MG/ML injection Inject 1 mL (100 mg total) daily into the skin. 60 Syringe 4  . escitalopram (LEXAPRO) 10 MG tablet Take by mouth.    . levonorgestrel (MIRENA) 20 MCG/24HR IUD by Intrauterine route.    Marland Kitchen oxyCODONE-acetaminophen (ROXICET) 5-325 MG tablet Take 1 tablet by mouth every 4 (four) hours as needed for severe pain. 30 tablet 0   No current facility-administered medications for this visit.      Review of Systems Full ROS  was asked and was negative except for the information on the HPI  Physical Exam Blood pressure 124/84, pulse 82, temperature 98.2 F (36.8 C), temperature source Oral, height _0  (1.651 m), weight 112.4 kg (247 lb 12.8 oz), currently breastfeeding. CONSTITUTIONAL:NAD. EYES: Pupils are equal, round, and reactive to light, Sclera are non-icteric. EARS, NOSE, MOUTH AND THROAT: The oropharynx is clear. The oral mucosa is pink and moist. Hearing is intact to voice. LYMPH NODES:  Lymph nodes in the neck are normal. RESPIRATORY:  Lungs are clear. There is normal respiratory effort, with equal breath sounds  bilaterally, and without pathologic use of accessory muscles. CARDIOVASCULAR: Heart is regular without murmurs, gallops, or rubs. GI: The abdomen is  soft, nontender, and nondistended. There are no palpable masses. There is no hepatosplenomegaly. There are normal bowel sounds in all quadrants. GU: Rectal deferred.   MUSCULOSKELETAL: Normal muscle strength and tone. No cyanosis or edema.   SKIN: Turgor is good and there are no pathologic skin lesions or ulcers. NEUROLOGIC: Motor and sensation is grossly normal. Cranial nerves are grossly intact. PSYCH:  Oriented to person, place and  time. Affect is normal.  Data Reviewed  I have personally reviewed the patient's imaging, laboratory findings and medical records.    Assessment/Plan 31 year old female with a history of protein S deficiency on Lovenox now with biliary colic from biliary dyskinesia and is large. Discussed with the patient in detail about her disease process and my recommendation for cholecystectomy.The risks, benefits, complications, treatment options, and expected outcomes were discussed with the patient. The possibilities of bleeding, recurrent infection, finding a normal gallbladder, perforation of viscus organs, damage to surrounding structures, bile leak, abscess formation, needing a drain placed, the need for additional procedures, reaction to medication, pulmonary aspiration,  failure to diagnose a condition, the possible need to convert to an open procedure, and creating a complication requiring transfusion or operation were discussed with the patient. The patient and/or family concurred with the proposed plan, giving informed consent. They are adamant about having surgery performed before the end of the year because they have already met tension deductibles. I have discussed the case in detail with Dr. Rosana Hoes that is on call that day and he is willing to surgery. We'll have her stop Lovenox 24 hours before the surgery.     Caroleen Hamman, MD FACS General Surgeon 04/20/2017, 9:40 AM

## 2017-04-22 MED ORDER — CEFAZOLIN SODIUM-DEXTROSE 2-4 GM/100ML-% IV SOLN
2.0000 g | INTRAVENOUS | Status: AC
  Administered 2017-04-23: 2 g via INTRAVENOUS

## 2017-04-23 ENCOUNTER — Ambulatory Visit
Admission: RE | Admit: 2017-04-23 | Discharge: 2017-04-23 | Disposition: A | Discharge status: Home / Self Care | Payer: No Typology Code available for payment source | Source: Ambulatory Visit | Attending: Surgery | Admitting: Surgery

## 2017-04-23 ENCOUNTER — Ambulatory Visit: Payer: No Typology Code available for payment source | Admitting: Certified Registered Nurse Anesthetist

## 2017-04-23 ENCOUNTER — Encounter: Payer: Self-pay | Admitting: Emergency Medicine

## 2017-04-23 ENCOUNTER — Encounter
Admission: RE | Disposition: A | Discharge status: Home / Self Care | Payer: Self-pay | Source: Ambulatory Visit | Attending: Surgery

## 2017-04-23 DIAGNOSIS — Z7901 Long term (current) use of anticoagulants: Secondary | ICD-10-CM | POA: Insufficient documentation

## 2017-04-23 DIAGNOSIS — K8010 Calculus of gallbladder with chronic cholecystitis without obstruction: Secondary | ICD-10-CM | POA: Diagnosis not present

## 2017-04-23 DIAGNOSIS — K828 Other specified diseases of gallbladder: Secondary | ICD-10-CM

## 2017-04-23 DIAGNOSIS — Z86718 Personal history of other venous thrombosis and embolism: Secondary | ICD-10-CM | POA: Insufficient documentation

## 2017-04-23 DIAGNOSIS — Z79899 Other long term (current) drug therapy: Secondary | ICD-10-CM | POA: Insufficient documentation

## 2017-04-23 DIAGNOSIS — F329 Major depressive disorder, single episode, unspecified: Secondary | ICD-10-CM | POA: Insufficient documentation

## 2017-04-23 DIAGNOSIS — Z86711 Personal history of pulmonary embolism: Secondary | ICD-10-CM | POA: Insufficient documentation

## 2017-04-23 DIAGNOSIS — D6859 Other primary thrombophilia: Secondary | ICD-10-CM | POA: Insufficient documentation

## 2017-04-23 HISTORY — PX: CHOLECYSTECTOMY: SHX55

## 2017-04-23 LAB — POCT PREGNANCY, URINE: PREG TEST UR: NEGATIVE

## 2017-04-23 SURGERY — LAPAROSCOPIC CHOLECYSTECTOMY
Anesthesia: General | Wound class: Clean Contaminated

## 2017-04-23 MED ORDER — KETOROLAC TROMETHAMINE 30 MG/ML IJ SOLN
INTRAMUSCULAR | Status: DC | PRN
  Administered 2017-04-23: 30 mg via INTRAVENOUS

## 2017-04-23 MED ORDER — FENTANYL CITRATE (PF) 100 MCG/2ML IJ SOLN
25.0000 ug | INTRAMUSCULAR | Status: AC | PRN
  Administered 2017-04-23 (×6): 25 ug via INTRAVENOUS

## 2017-04-23 MED ORDER — LIDOCAINE HCL (PF) 2 % IJ SOLN
INTRAMUSCULAR | Status: AC
  Filled 2017-04-23: qty 10

## 2017-04-23 MED ORDER — ROCURONIUM BROMIDE 50 MG/5ML IV SOLN
INTRAVENOUS | Status: AC
  Filled 2017-04-23: qty 1

## 2017-04-23 MED ORDER — ROCURONIUM BROMIDE 100 MG/10ML IV SOLN
INTRAVENOUS | Status: DC | PRN
  Administered 2017-04-23: 50 mg via INTRAVENOUS

## 2017-04-23 MED ORDER — PROMETHAZINE HCL 25 MG/ML IJ SOLN: 6.2500 mg | INTRAMUSCULAR | Status: DC | PRN

## 2017-04-23 MED ORDER — OXYCODONE-ACETAMINOPHEN 5-325 MG PO TABS
ORAL_TABLET | ORAL | Status: AC
  Filled 2017-04-23: qty 1

## 2017-04-23 MED ORDER — FENTANYL CITRATE (PF) 100 MCG/2ML IJ SOLN
INTRAMUSCULAR | Status: AC
  Administered 2017-04-23: 25 ug via INTRAVENOUS
  Filled 2017-04-23: qty 2

## 2017-04-23 MED ORDER — MIDAZOLAM HCL 2 MG/2ML IJ SOLN
INTRAMUSCULAR | Status: DC | PRN
  Administered 2017-04-23: 2 mg via INTRAVENOUS

## 2017-04-23 MED ORDER — LIDOCAINE HCL (CARDIAC) 20 MG/ML IV SOLN
INTRAVENOUS | Status: DC | PRN
  Administered 2017-04-23: 80 mg via INTRAVENOUS

## 2017-04-23 MED ORDER — SUGAMMADEX SODIUM 200 MG/2ML IV SOLN
INTRAVENOUS | Status: AC
  Filled 2017-04-23: qty 2

## 2017-04-23 MED ORDER — LIDOCAINE HCL (PF) 1 % IJ SOLN
INTRAMUSCULAR | Status: AC
  Filled 2017-04-23: qty 30

## 2017-04-23 MED ORDER — LACTATED RINGERS IV SOLN
INTRAVENOUS | Status: DC
  Administered 2017-04-23: 11:00:00 via INTRAVENOUS

## 2017-04-23 MED ORDER — BUPIVACAINE HCL (PF) 0.5 % IJ SOLN
INTRAMUSCULAR | Status: AC
  Filled 2017-04-23: qty 30

## 2017-04-23 MED ORDER — DEXAMETHASONE SODIUM PHOSPHATE 10 MG/ML IJ SOLN
INTRAMUSCULAR | Status: DC | PRN
  Administered 2017-04-23: 5 mg via INTRAVENOUS

## 2017-04-23 MED ORDER — ONDANSETRON HCL 4 MG/2ML IJ SOLN
INTRAMUSCULAR | Status: DC | PRN
  Administered 2017-04-23: 4 mg via INTRAVENOUS

## 2017-04-23 MED ORDER — KETOROLAC TROMETHAMINE 30 MG/ML IJ SOLN
INTRAMUSCULAR | Status: AC
  Filled 2017-04-23: qty 1

## 2017-04-23 MED ORDER — FENTANYL CITRATE (PF) 250 MCG/5ML IJ SOLN
INTRAMUSCULAR | Status: AC
  Filled 2017-04-23: qty 5

## 2017-04-23 MED ORDER — EPHEDRINE SULFATE 50 MG/ML IJ SOLN
INTRAMUSCULAR | Status: AC
  Filled 2017-04-23: qty 1

## 2017-04-23 MED ORDER — CEFAZOLIN SODIUM-DEXTROSE 2-4 GM/100ML-% IV SOLN
INTRAVENOUS | Status: AC
  Filled 2017-04-23: qty 100

## 2017-04-23 MED ORDER — OXYCODONE-ACETAMINOPHEN 5-325 MG PO TABS
1.0000 | ORAL_TABLET | ORAL | Status: DC | PRN
  Administered 2017-04-23: 1 via ORAL

## 2017-04-23 MED ORDER — DEXAMETHASONE SODIUM PHOSPHATE 10 MG/ML IJ SOLN
INTRAMUSCULAR | Status: AC
  Filled 2017-04-23: qty 1

## 2017-04-23 MED ORDER — PROPOFOL 10 MG/ML IV BOLUS
INTRAVENOUS | Status: AC
  Filled 2017-04-23: qty 20

## 2017-04-23 MED ORDER — FENTANYL CITRATE (PF) 100 MCG/2ML IJ SOLN
INTRAMUSCULAR | Status: DC | PRN
  Administered 2017-04-23: 100 ug via INTRAVENOUS

## 2017-04-23 MED ORDER — ONDANSETRON HCL 4 MG/2ML IJ SOLN
INTRAMUSCULAR | Status: AC
  Filled 2017-04-23: qty 2

## 2017-04-23 MED ORDER — PROPOFOL 10 MG/ML IV BOLUS
INTRAVENOUS | Status: DC | PRN
  Administered 2017-04-23: 200 mg via INTRAVENOUS

## 2017-04-23 MED ORDER — MIDAZOLAM HCL 2 MG/2ML IJ SOLN
INTRAMUSCULAR | Status: AC
  Filled 2017-04-23: qty 2

## 2017-04-23 MED ORDER — GLYCOPYRROLATE 0.2 MG/ML IJ SOLN
INTRAMUSCULAR | Status: DC | PRN
  Administered 2017-04-23: 0.2 mg via INTRAVENOUS

## 2017-04-23 MED ORDER — EPHEDRINE SULFATE 50 MG/ML IJ SOLN
INTRAMUSCULAR | Status: DC | PRN
  Administered 2017-04-23: 10 mg via INTRAVENOUS

## 2017-04-23 MED ORDER — LIDOCAINE HCL 1 % IJ SOLN
INTRAMUSCULAR | Status: DC | PRN
  Administered 2017-04-23: 20 mL

## 2017-04-23 MED ORDER — GLYCOPYRROLATE 0.2 MG/ML IJ SOLN
INTRAMUSCULAR | Status: AC
  Filled 2017-04-23: qty 1

## 2017-04-23 MED ORDER — SUGAMMADEX SODIUM 200 MG/2ML IV SOLN
INTRAVENOUS | Status: DC | PRN
Start: 2017-04-23 — End: 2017-04-23
  Administered 2017-04-23: 200 mg via INTRAVENOUS

## 2017-04-23 SURGICAL SUPPLY — 34 items
APPLIER CLIP ROT 10 11.4 M/L (STAPLE) ×2
CHLORAPREP W/TINT 26ML (MISCELLANEOUS) ×2 IMPLANT
CLIP APPLIE ROT 10 11.4 M/L (STAPLE) ×1 IMPLANT
DECANTER SPIKE VIAL GLASS SM (MISCELLANEOUS) ×4 IMPLANT
DERMABOND ADVANCED (GAUZE/BANDAGES/DRESSINGS) ×1
DERMABOND ADVANCED .7 DNX12 (GAUZE/BANDAGES/DRESSINGS) ×1 IMPLANT
DEVICE PMI PUNCTURE CLOSURE (MISCELLANEOUS) ×2 IMPLANT
DRESSING SURGICEL FIBRLLR 1X2 (HEMOSTASIS) IMPLANT
DRSG SURGICEL FIBRILLAR 1X2 (HEMOSTASIS)
ELECT REM PT RETURN 9FT ADLT (ELECTROSURGICAL) ×2
ELECTRODE REM PT RTRN 9FT ADLT (ELECTROSURGICAL) ×1 IMPLANT
GLOVE BIO SURGEON STRL SZ7 (GLOVE) ×6 IMPLANT
GLOVE BIOGEL PI IND STRL 7.5 (GLOVE) ×3 IMPLANT
GLOVE BIOGEL PI INDICATOR 7.5 (GLOVE) ×3
GOWN STRL REUS W/ TWL LRG LVL3 (GOWN DISPOSABLE) ×3 IMPLANT
GOWN STRL REUS W/TWL LRG LVL3 (GOWN DISPOSABLE) ×3
IRRIGATION STRYKERFLOW (MISCELLANEOUS) IMPLANT
IRRIGATOR STRYKERFLOW (MISCELLANEOUS)
IV NS 1000ML (IV SOLUTION) ×1
IV NS 1000ML BAXH (IV SOLUTION) ×1 IMPLANT
KIT RM TURNOVER STRD PROC AR (KITS) ×2 IMPLANT
NEEDLE HYPO 22GX1.5 SAFETY (NEEDLE) ×2 IMPLANT
NEEDLE INSUFFLATION 14GA 120MM (NEEDLE) ×2 IMPLANT
NS IRRIG 1000ML POUR BTL (IV SOLUTION) ×2 IMPLANT
PACK LAP CHOLECYSTECTOMY (MISCELLANEOUS) ×2 IMPLANT
POUCH SPECIMEN RETRIEVAL 10MM (ENDOMECHANICALS) ×2 IMPLANT
SCISSORS METZENBAUM CVD 33 (INSTRUMENTS) IMPLANT
SLEEVE ENDOPATH XCEL 5M (ENDOMECHANICALS) ×4 IMPLANT
SUT MNCRL AB 4-0 PS2 18 (SUTURE) ×2 IMPLANT
SUT VICRYL 0 UR6 27IN ABS (SUTURE) ×2 IMPLANT
SUT VICRYL AB 3-0 FS1 BRD 27IN (SUTURE) ×2 IMPLANT
TROCAR XCEL NON-BLD 11X100MML (ENDOMECHANICALS) ×2 IMPLANT
TROCAR XCEL NON-BLD 5MMX100MML (ENDOMECHANICALS) ×2 IMPLANT
TUBING INSUFFLATION (TUBING) ×2 IMPLANT

## 2017-04-23 NOTE — Interval H&P Note (Signed)
History and Physical Interval Note:  04/23/2017 1:08 PM  [name]  has presented today for surgery, with the diagnosis of BILIARY COLIC  The various methods of treatment have been discussed with the patient and family. After consideration of risks, benefits and other options for treatment, the patient has consented to  Procedure(s): LAPAROSCOPIC CHOLECYSTECTOMY (N/A) as a surgical intervention .  The patient's history has been reviewed, patient examined, no change in status, stable for surgery.  I have reviewed the patient's chart and labs.  Questions were answered to the patient's satisfaction.     Ancil LinseyJason Evan Davis

## 2017-04-23 NOTE — OR Nursing (Signed)
Pt advises pain improved 3/10 now, wants to rest longer before going home.

## 2017-04-23 NOTE — Anesthesia Procedure Notes (Signed)
Procedure Name: Intubation Date/Time: 04/23/2017 1:27 PM Performed by: Dava NajjarFrazier, Gracemarie Skeet, CRNA Pre-anesthesia Checklist: Patient identified, Emergency Drugs available, Suction available, Patient being monitored and Timeout performed Patient Re-evaluated:Patient Re-evaluated prior to induction Oxygen Delivery Method: Circle system utilized Preoxygenation: Pre-oxygenation with 100% oxygen Induction Type: IV induction Ventilation: Mask ventilation without difficulty Laryngoscope Size: Miller and 2 Grade View: Grade I Tube size: 7.0 mm Number of attempts: 1 Airway Equipment and Method: Stylet Placement Confirmation: ETT inserted through vocal cords under direct vision,  positive ETCO2 and breath sounds checked- equal and bilateral Secured at: 21 cm Tube secured with: Tape Dental Injury: Teeth and Oropharynx as per pre-operative assessment

## 2017-04-23 NOTE — Transfer of Care (Signed)
Immediate Anesthesia Transfer of Care Note  Patient: Michelle Hogan  Procedure(s) Performed: LAPAROSCOPIC CHOLECYSTECTOMY (N/A )  Patient Location: PACU  Anesthesia Type:General  Level of Consciousness: awake, alert , oriented and patient cooperative  Airway & Oxygen Therapy: Patient Spontanous Breathing and Patient connected to face mask oxygen  Post-op Assessment: Report given to RN and Post -op Vital signs reviewed and stable  Post vital signs: Reviewed and stable  Last Vitals:  Vitals:   04/23/17 1019 04/23/17 1438  BP: (!) 94/53 118/72  Pulse: 62 84  Resp: 17 12  Temp: 36.7 C (!) 36.3 C  SpO2: 98% 100%    Last Pain:  Vitals:   04/23/17 1019  TempSrc: Oral  PainSc: 0-No pain         Complications: No apparent anesthesia complications

## 2017-04-23 NOTE — Op Note (Signed)
SURGICAL OPERATIVE REPORT   DATE OF PROCEDURE: 04/23/2017  ATTENDING Surgeon(s): Ancil Linseyavis, Lysa Livengood Evan, MD  ANESTHESIA: GETA  PRE-OPERATIVE DIAGNOSIS: Biliary Dyskinesia (K81.9)  POST-OPERATIVE DIAGNOSIS: Biliary Dyskinesia (K81.9)  PROCEDURE(S): (cpt's: 47562) 1.) Laparoscopic Cholecystectomy  INTRAOPERATIVE FINDINGS: Minimal peri-cholecystic inflammation, cystic duct and cystic artery clearly identified, hemostasis confirmed  INTRAOPERATIVE FLUIDS: 500 mL crystalloid   ESTIMATED BLOOD LOSS: Minimal (<30 mL)   URINE OUTPUT: No foley  SPECIMENS: Gallbladder  IMPLANTS: None  DRAINS: None   COMPLICATIONS: None apparent   CONDITION AT COMPLETION: Hemodynamically stable and extubated  DISPOSITION: PACU   INDICATION(S) FOR PROCEDURE:  Patient is a 31 y.o. female with a history of chronic biliary dyskinesia, who has had to delay surgery due to DVT, PE, and recent pregnancy. All risks, benefits, and alternatives to above elective procedures were discussed with the patient, who elected to proceed, and informed consent was accordingly obtained at that time.   DETAILS OF PROCEDURE:  Patient was brought to the operating suite and appropriately identified. General anesthesia was administered along with peri-operative prophylactic IV antibiotics, and endotracheal intubation was performed by anesthesiologist, along with NG/OG tube for gastric decompression. In supine position, operative site was prepped and draped in usual sterile fashion, and following a brief time out, initial 5 mm incision was made in a natural skin crease just above the umbilicus. Fascia was then elevated, and a Verress needle was inserted and its proper position confirmed using aspiration and saline meniscus test.  Upon insufflation of the abdominal cavity with carbon dioxide to a well-tolerated pressure of 12-15 mmHg, 5 mm peri-umbilical port followed by laparoscope were inserted and used to inspect the abdominal  cavity and its contents with no injuries from insertion of the first trochar noted. Three additional trocars were inserted, one at the epigastric position (10 mm) and two along the Right costal margin (5 mm). The table was then placed in reverse Trendelenburg position with the Right side up. Filmy adhesions between the gallbladder and omentum/duodenum/transverse colon were lysed using combined blunt dissection and selective electrocautery. The apex/dome of the gallbladder was grasped with an atraumatic grasper passed through the lateral port and retracted apically over the liver. The infundibulum was also grasped and retracted, exposing Calot's triangle. The peritoneum overlying the gallbladder infundibulum was incised and dissected free of surrounding peritoneal attachments, revealing the cystic duct and cystic artery, which were clipped twice on the patient side and once on the gallbladder specimen side close to the gallbladder. The gallbladder was then dissected from its peritoneal attachments to the liver using electrocautery, and the gallbladder was placed into a laparoscopic specimen bag and removed from the abdominal cavity via the epigastric port site. Hemostasis and secure placement of clips were confirmed, and intra-peritoneal cavity was inspected with no additional findings. PMI laparoscopic fascial closure device was then used to re-approximate fascia at the 10 mm epigastric port site.  All ports were then removed under direct visualization, and abdominal cavity was desuflated. All port sites were irrigated/cleaned, additional local anesthetic was injected at each incision, 3-0 Vicryl was used to re-approximate dermis at 10 mm port site(s), and subcuticular 4-0 Monocryl suture was used to re-approximate skin. Skin was then cleaned, dried, and sterile skin glue was applied. Patient was then safely able to be awakened, extubated, and transferred to PACU for post-operative monitoring and care.   I was  present for all aspects of procedure, and there were no intra-operative complications apparent.

## 2017-04-23 NOTE — Anesthesia Post-op Follow-up Note (Signed)
Anesthesia QCDR form completed.        

## 2017-04-23 NOTE — Anesthesia Preprocedure Evaluation (Signed)
Anesthesia Evaluation  Patient identified by MRN, date of birth, ID band Patient awake    Reviewed: Allergy & Precautions, H&P , NPO status , Patient's Chart, lab work & pertinent test results, reviewed documented beta blocker date and time   History of Anesthesia Complications Negative for: history of anesthetic complications  Airway Mallampati: I  TM Distance: >3 FB Neck ROM: full    Dental   Pulmonary neg pulmonary ROS,           Cardiovascular Exercise Tolerance: Good (-) hypertension(-) angina+ DVT  (-) CAD, (-) Past MI, (-) Cardiac Stents and (-) CABG (-) dysrhythmias (-) Valvular Problems/Murmurs     Neuro/Psych PSYCHIATRIC DISORDERS negative neurological ROS     GI/Hepatic negative GI ROS, Neg liver ROS,   Endo/Other  neg diabetesMorbid obesity  Renal/GU negative Renal ROS  negative genitourinary   Musculoskeletal   Abdominal   Peds  Hematology Protein S deficiency   Anesthesia Other Findings Past Medical History: No date: Depression No date: DVT (deep venous thrombosis) (HCC) No date: PE (pulmonary embolism) No date: Protein S deficiency (HCC)   Reproductive/Obstetrics negative OB ROS                             Anesthesia Physical Anesthesia Plan  ASA: III  Anesthesia Plan: General   Post-op Pain Management:    Induction: Intravenous  PONV Risk Score and Plan: 3 and Ondansetron and Dexamethasone  Airway Management Planned: Oral ETT  Additional Equipment:   Intra-op Plan:   Post-operative Plan: Extubation in OR  Informed Consent: I have reviewed the patients History and Physical, chart, labs and discussed the procedure including the risks, benefits and alternatives for the proposed anesthesia with the patient or authorized representative who has indicated his/her understanding and acceptance.   Dental Advisory Given  Plan Discussed with: Anesthesiologist, CRNA  and Surgeon  Anesthesia Plan Comments:         Anesthesia Quick Evaluation

## 2017-04-23 NOTE — Discharge Instructions (Signed)
In addition to included general post-operative instructions for Laparoscopic Cholecystectomy,  Diet: Resume home heart healthy diet.   Activity: No heavy lifting >15 - 20 pounds (children, pets, laundry, garbage) or strenuous activity until follow-up, but light activity and walking are encouraged. Do not drive or drink alcohol if taking narcotic pain medications.  Wound care: 2 days after surgery (Wednesday 1/2), may shower/get incision wet with soapy water and pat dry (do not rub incisions), but no baths or submerging incision underwater until follow-up.   Medications: Resume all home medications, including may resume Lovenox the day following surgery. For mild to moderate pain: acetaminophen (Tylenol) or ibuprofen/naproxen (if no kidney disease). Combining Tylenol with alcohol can substantially increase your risk of causing liver disease. Narcotic pain medications, if prescribed, can be used for severe pain, though may cause nausea, constipation, and drowsiness. Do not combine Tylenol and Percocet (or similar) within a 6 hour period as Percocet (and similar) contain(s) Tylenol. If you do not need the narcotic pain medication, you do not need to fill the prescription.  Call office [phone]) at any time if any questions, worsening pain, fevers/chills, bleeding, drainage from incision site, or other concerns.   AMBULATORY SURGERY  DISCHARGE INSTRUCTIONS   1) The drugs that you were given will stay in your system until tomorrow so for the next 24 hours you should not:  A) Drive an automobile B) Make any legal decisions C) Drink any alcoholic beverage   2) You may resume regular meals tomorrow.  Today it is better to start with liquids and gradually work up to solid foods.  You may eat anything you prefer, but it is better to start with liquids, then soup and crackers, and gradually work up to solid foods.   3) Please notify your doctor immediately if you have any unusual bleeding,  trouble breathing, redness and pain at the surgery site, drainage, fever, or pain not relieved by medication.    4) Additional Instructions:        Please contact your physician with any problems or Same Day Surgery at [phone], Monday through Friday 6 am to 4 pm, or [place] at [facility] Main number at [phone].

## 2017-04-25 NOTE — Anesthesia Postprocedure Evaluation (Signed)
Anesthesia Post Note  Patient: Michelle Hogan  Procedure(s) Performed: LAPAROSCOPIC CHOLECYSTECTOMY (N/A )  Patient location during evaluation: PACU Anesthesia Type: General Level of consciousness: awake and alert Pain management: pain level controlled Vital Signs Assessment: post-procedure vital signs reviewed and stable Respiratory status: spontaneous breathing, nonlabored ventilation, respiratory function stable and patient connected to nasal cannula oxygen Cardiovascular status: blood pressure returned to baseline and stable Postop Assessment: no apparent nausea or vomiting Anesthetic complications: no     Last Vitals:  Vitals:   04/23/17 1645 04/23/17 1742  BP: 110/69 119/83  Pulse: 66 (!) 58  Resp: 16 16  Temp: (!) 36.2 C   SpO2: 98% 98%    Last Pain:  Vitals:   04/24/17 0934  TempSrc:   PainSc: 3                  Lenard SimmerAndrew Raysean Graumann

## 2017-04-26 LAB — SURGICAL PATHOLOGY

## 2017-05-03 ENCOUNTER — Encounter: Payer: Self-pay | Admitting: Surgery

## 2017-05-03 ENCOUNTER — Ambulatory Visit (INDEPENDENT_AMBULATORY_CARE_PROVIDER_SITE_OTHER): Payer: PRIVATE HEALTH INSURANCE | Admitting: Surgery

## 2017-05-03 VITALS — BP 111/73 | HR 68 | Temp 98.2°F | Ht 65.0 in | Wt 238.0 lb

## 2017-05-03 DIAGNOSIS — K828 Other specified diseases of gallbladder: Secondary | ICD-10-CM

## 2017-05-03 NOTE — Progress Notes (Signed)
Surgical Clinic Progress/Follow-up Note   HPI:  32 y.o. Female presents to clinic for post-op follow-up evaluation s/p laparoscopic cholecystectomy for symptomatic biliary dyskinesia. Patient reports complete resolution of pre-operative post-prandial RUQ abdominal pain and has been tolerating regular diet with +flatus and normal BM's, denies N/V, fever/chills, CP, or SOB. She says she only took narcotic pain medication once or twice on the first day after her surgery.  Review of Systems:  Constitutional: denies any other weight loss, fever, chills, or sweats  Eyes: denies any other vision changes, history of eye injury  ENT: denies sore throat, hearing problems  Respiratory: denies shortness of breath, wheezing  Cardiovascular: denies chest pain, palpitations  Gastrointestinal: abdominal pain, N/V, and bowel function as per HPI Musculoskeletal: denies any other joint pains or cramps  Skin: Denies any other rashes or skin discolorations  Neurological: denies any other headache, dizziness, weakness  Psychiatric: denies any other depression, anxiety  All other review of systems: otherwise negative   Vital Signs:  BP 111/73   Pulse 68   Temp 98.2 F (36.8 C) (Oral)   Ht 5\' 5"  (1.651 m)   Wt 238 lb (108 kg)   BMI 39.61 kg/m    Physical Exam:  Constitutional:  -- Overweight body habitus (though post-partum) -- Awake, alert, and oriented x3  Eyes:  -- Pupils equally round and reactive to light  -- No scleral icterus  Ear, nose, throat:  -- No jugular venous distension  -- No nasal drainage, bleeding Pulmonary:  -- No crackles -- Equal breath sounds bilaterally -- Breathing non-labored at rest Cardiovascular:  -- S1, S2 present  -- No pericardial rubs  Gastrointestinal:  -- Soft, nontender, non-distended, no guarding/rebound -- Incisions well-approximated without surrounding erythema or drainage -- No abdominal masses appreciated, pulsatile or otherwise  Musculoskeletal /  Integumentary:  -- Wounds or skin discoloration: None appreciated except post-surgical wounds as described above (GI) -- Extremities: B/L UE and LE FROM, hands and feet warm, no edema  Neurologic:  -- Motor function: intact and symmetric  -- Sensation: intact and symmetric   Assessment:  32 y.o. yo Female with a problem list including...  Patient Active Problem List   Diagnosis Date Noted  . Acute pulmonary embolism (HCC) 04/19/2017  . DVT (deep venous thrombosis) (HCC) 04/19/2017  . Labor and delivery, indication for care 07/17/2016  . Deficiency anemia 05/17/2016  . Primary hypercoagulable state (HCC) 01/04/2016  . High risk pregnancy, antepartum 12/20/2015  . Major depressive disorder, single episode, moderate (HCC) 08/18/2015  . Hyperlipidemia 08/12/2015  . FH: heart disease 08/12/2015  . Postpartum depression 08/12/2015  . Vitamin D deficiency 07/14/2015  . Obesity, Class II, BMI 35-39.9 07/12/2015  . Dysfunctional gallbladder 07/12/2015  . Hx pulmonary embolism 01/27/2015    presents to clinic for post-op follow-up evaluation, doing well s/p laparoscopic cholecystectomy for symptomatic biliary dyskinesia.  Plan:              - advance diet as tolerated              - okay to submerge incisions under water (baths, swimming) prn             - gradually resume all activities without restrictions over next 2 weeks             - apply sunblock particularly to incisions with sun exposure to reduce pigmentation of scars             - return to clinic as  needed, instructed to call office if any questions or concerns   All of the above recommendations were discussed with the patient, and all of patient's questions were answered to her expressed satisfaction.  -- Scherrie GerlachJason E. Earlene Plateravis, MD, RPVI [place]: [facility] - Partnering for exceptional care. Office: [phone]

## 2017-05-03 NOTE — Patient Instructions (Signed)

## 2017-06-13 ENCOUNTER — Ambulatory Visit
Admission: EM | Admit: 2017-06-13 | Discharge: 2017-06-13 | Disposition: A | Discharge status: Home / Self Care | Payer: No Typology Code available for payment source | Attending: Family Medicine | Admitting: Family Medicine

## 2017-06-13 ENCOUNTER — Other Ambulatory Visit: Payer: Self-pay

## 2017-06-13 DIAGNOSIS — N644 Mastodynia: Secondary | ICD-10-CM

## 2017-06-13 DIAGNOSIS — N6459 Other signs and symptoms in breast: Secondary | ICD-10-CM

## 2017-06-13 DIAGNOSIS — J029 Acute pharyngitis, unspecified: Secondary | ICD-10-CM

## 2017-06-13 LAB — RAPID STREP SCREEN (MED CTR MEBANE ONLY): STREPTOCOCCUS, GROUP A SCREEN (DIRECT): NEGATIVE

## 2017-06-13 MED ORDER — CEPHALEXIN 500 MG PO CAPS: 500.0000 mg | ORAL_CAPSULE | Freq: Two times a day (BID) | ORAL | 0 refills | Status: DC

## 2017-06-13 MED ORDER — LIDOCAINE VISCOUS 2 % MT SOLN: OROMUCOSAL | 0 refills | Status: DC

## 2017-06-13 NOTE — ED Provider Notes (Signed)
[facility]    CSN: [id number] Arrival date & time: 06/13/17  [id number]     History   Chief Complaint Chief Complaint  Patient presents with  . Sore Throat    HPI Lenox Pondsmanda D Cornick is a 32 y.o. female.   32 yo female with a c/o sore throat and fever since yesterday. Denies any runny nose, congestion, cough. Patient also c/o right nipple pain for the past couple of weeks. Patient is breastfeeding her 2210 month old. Was seen 2 days ago at an urgent care and started on clotrimazole and mupirocin, however patient states not improving. Nipple is cracked and there is some redness.    The history is provided by the patient.  Sore Throat     Past Medical History:  Diagnosis Date  . Depression   . DVT (deep venous thrombosis) (HCC)   . PE (pulmonary embolism)   . Protein S deficiency [facility](HCC)     Patient Active Problem List   Diagnosis Date Noted  . Acute pulmonary embolism (HCC) 04/19/2017  . DVT (deep venous thrombosis) (HCC) 04/19/2017  . Labor and delivery, indication for care 07/17/2016  . Deficiency anemia 05/17/2016  . Primary hypercoagulable state (HCC) 01/04/2016  . High risk pregnancy, antepartum 12/20/2015  . Major depressive disorder, single episode, moderate (HCC) 08/18/2015  . Hyperlipidemia 08/12/2015  . FH: heart disease 08/12/2015  . Postpartum depression 08/12/2015  . Vitamin D deficiency 07/14/2015  . Obesity, Class II, BMI 35-39.9 07/12/2015  . Biliary dyskinesia 07/12/2015  . Hx pulmonary embolism 01/27/2015    Past Surgical History:  Procedure Laterality Date  . ANKLE SURGERY Left   . CESAREAN SECTION N/A 07/17/2016   Procedure: C-Section;  Surgeon: Elenora Fenderhelsea C Ward, MD;  Location: [facility] ORS;  Service: Obstetrics;  Laterality: N/A;  . CHOLECYSTECTOMY N/A 04/23/2017   Procedure: LAPAROSCOPIC CHOLECYSTECTOMY;  Surgeon: Ancil Linseyavis, Jason Evan, MD;  Location: [facility] ORS;  Service: General;  Laterality: N/A;    OB History    Gravida Para Term Preterm AB  Living   2 1 1     1    SAB TAB Ectopic Multiple Live Births           1       Home Medications    Prior to Admission medications   Medication Sig Start Date End Date Taking? Authorizing Provider  enoxaparin (LOVENOX) 100 MG/ML injection Inject 1 mL (100 mg total) daily into the skin. 02/27/17  Yes Earna CoderBrahmanday, Govinda R, MD  escitalopram (LEXAPRO) 10 MG tablet Take 10 mg by mouth daily.  01/23/17  Yes [provider]  levonorgestrel (MIRENA) 20 MCG/24HR IUD 1 each by Intrauterine route once.    Yes [provider]  cephALEXin (KEFLEX) 500 MG capsule Take 1 capsule (500 mg total) by mouth 2 (two) times daily. 06/13/17   Payton Mccallumonty, Kaidyn Hernandes, MD  lidocaine (XYLOCAINE) 2 % solution 20 ml gargle and spit q 6 hours prn sore throat 06/13/17   Payton Mccallumonty, Kolter Reaver, MD    Family History Family History  Problem Relation Age of Onset  . Hyperlipidemia Mother   . Depression Mother   . Heart attack Father   . Heart disease Father   . Deep vein thrombosis Brother   . Diabetes Maternal Grandfather   . Bladder Cancer Maternal Grandfather     Social History Social History   Tobacco Use  . Smoking status: Never Smoker  . Smokeless tobacco: Never Used  Substance Use Topics  . Alcohol use: Yes  Alcohol/week: 0.6 oz    Types: 1 Cans of beer per week    Comment: rarely  . Drug use: No     Allergies   Patient has no known allergies.   Review of Systems Review of Systems   Physical Exam Triage Vital Signs ED Triage Vitals  Enc Vitals Group     BP 06/13/17 0835 (!) 105/58     Pulse Rate 06/13/17 0835 93     Resp 06/13/17 0835 18     Temp 06/13/17 0835 98.3 F (36.8 C)     Temp Source 06/13/17 0835 Oral     SpO2 06/13/17 0835 98 %     Weight 06/13/17 0833 228 lb (103.4 kg)     Height 06/13/17 0833 5\' 5"  (1.651 m)     Head Circumference --      Peak Flow --      Pain Score 06/13/17 0831 3     Pain Loc --      Pain Edu? --      Excl. in GC? --    No data  found.  Updated Vital Signs BP (!) 105/58 (BP Location: Right Arm)   Pulse 93   Temp 98.3 F (36.8 C) (Oral) Comment: took ibuprofen this morning  Resp 18   Ht 5\' 5"  (1.651 m)   Wt 228 lb (103.4 kg)   SpO2 98%   Breastfeeding? Yes   BMI 37.94 kg/m   Visual Acuity Right Eye Distance:   Left Eye Distance:   Bilateral Distance:    Right Eye Near:   Left Eye Near:    Bilateral Near:     Physical Exam  Constitutional: She appears well-developed and well-nourished. No distress.  HENT:  Head: Normocephalic and atraumatic.  Right Ear: Tympanic membrane, external ear and ear canal normal.  Left Ear: Tympanic membrane, external ear and ear canal normal.  Nose: No mucosal edema, rhinorrhea, nose lacerations, sinus tenderness, nasal deformity, septal deviation or nasal septal hematoma. No epistaxis.  No foreign bodies. Right sinus exhibits no maxillary sinus tenderness and no frontal sinus tenderness. Left sinus exhibits no maxillary sinus tenderness and no frontal sinus tenderness.  Mouth/Throat: Uvula is midline and mucous membranes are normal. Posterior oropharyngeal erythema present. No oropharyngeal exudate or posterior oropharyngeal edema. No tonsillar exudate.  Eyes: Conjunctivae and EOM are normal. Pupils are equal, round, and reactive to light. Right eye exhibits no discharge. Left eye exhibits no discharge. No scleral icterus.  Neck: Normal range of motion. Neck supple. No thyromegaly present.  Cardiovascular: Normal rate, regular rhythm and normal heart sounds.  Pulmonary/Chest: Effort normal and breath sounds normal. No respiratory distress. She has no wheezes. She has no rales. Right breast exhibits tenderness (mild areolar erythema; nipple skin cracked).  Lymphadenopathy:    She has no cervical adenopathy.  Skin: She is not diaphoretic.  Nursing note and vitals reviewed.    UC Treatments / Results  Labs (all labs ordered are listed, but only abnormal results are  displayed) Labs Reviewed  RAPID STREP SCREEN (NOT AT [facility])  CULTURE, GROUP A STREP [facility])    EKG  EKG Interpretation None       Radiology No results found.  Procedures Procedures (including critical care time)  Medications Ordered in UC Medications - No data to display   Initial Impression / Assessment and Plan / UC Course  I have reviewed the triage vital signs and the nursing notes.  Pertinent labs & imaging results that were available  during my care of the patient were reviewed by me and considered in my medical decision making (see chart for details).       Final Clinical Impressions(s) / UC Diagnoses   Final diagnoses:  Viral pharyngitis  Nipple pain    ED Discharge Orders        Ordered    cephALEXin (KEFLEX) 500 MG capsule  2 times daily     06/13/17 0912    lidocaine (XYLOCAINE) 2 % solution     06/13/17 0913     1. Lab results and diagnosis reviewed with patient 2. rx as per orders above; reviewed possible side effects, interactions, risks and benefits  3. Recommend supportive treatment with warm compresses to right breast  4. Follow-up prn if symptoms worsen or don't improve  Controlled Substance Prescriptions [place] Controlled Substance Registry consulted? Not Applicable   Payton Mccallum, MD 06/13/17 1910

## 2017-06-13 NOTE — ED Triage Notes (Addendum)
Patient complains of sore throat and fever that started yesterday and worsened overnight. Patient reports painful swallowing.   Patient states that she was seen on Monday at an Urgent Care for spasms in her nipples. Patient states that she is currently breastfeeding. They placed her on Clotrimazole and Mupirocin. Patient does not report relief.

## 2017-06-14 LAB — CULTURE, GROUP A STREP (THRC)

## 2017-06-29 ENCOUNTER — Inpatient Hospital Stay: Payer: Managed Care, Other (non HMO) | Attending: Internal Medicine

## 2017-06-29 ENCOUNTER — Other Ambulatory Visit: Payer: Self-pay | Admitting: Internal Medicine

## 2017-06-29 DIAGNOSIS — D6859 Other primary thrombophilia: Secondary | ICD-10-CM | POA: Diagnosis not present

## 2017-06-29 DIAGNOSIS — Z86711 Personal history of pulmonary embolism: Secondary | ICD-10-CM | POA: Diagnosis not present

## 2017-06-29 DIAGNOSIS — Z7901 Long term (current) use of anticoagulants: Secondary | ICD-10-CM | POA: Insufficient documentation

## 2017-06-29 DIAGNOSIS — Z86718 Personal history of other venous thrombosis and embolism: Secondary | ICD-10-CM | POA: Insufficient documentation

## 2017-06-29 LAB — CBC WITH DIFFERENTIAL/PLATELET
BASOS ABS: 0 10*3/uL (ref 0–0.1)
Basophils Relative: 1 %
EOS PCT: 2 %
Eosinophils Absolute: 0.1 10*3/uL (ref 0–0.7)
HCT: 39.3 % (ref 35.0–47.0)
Hemoglobin: 13.6 g/dL (ref 12.0–16.0)
LYMPHS PCT: 35 %
Lymphs Abs: 2 10*3/uL (ref 1.0–3.6)
MCH: 29.5 pg (ref 26.0–34.0)
MCHC: 34.7 g/dL (ref 32.0–36.0)
MCV: 84.8 fL (ref 80.0–100.0)
Monocytes Absolute: 0.3 10*3/uL (ref 0.2–0.9)
Monocytes Relative: 5 %
NEUTROS ABS: 3.2 10*3/uL (ref 1.4–6.5)
Neutrophils Relative %: 57 %
PLATELETS: 276 10*3/uL (ref 150–440)
RBC: 4.63 MIL/uL (ref 3.80–5.20)
RDW: 14.6 % — ABNORMAL HIGH (ref 11.5–14.5)
WBC: 5.6 10*3/uL (ref 3.6–11.0)

## 2017-06-29 LAB — COMPREHENSIVE METABOLIC PANEL
ALT: 21 U/L (ref 14–54)
ANION GAP: 10 (ref 5–15)
AST: 16 U/L (ref 15–41)
Albumin: 4.5 g/dL (ref 3.5–5.0)
Alkaline Phosphatase: 85 U/L (ref 38–126)
BILIRUBIN TOTAL: 0.4 mg/dL (ref 0.3–1.2)
BUN: 18 mg/dL (ref 6–20)
CO2: 24 mmol/L (ref 22–32)
Calcium: 9.2 mg/dL (ref 8.9–10.3)
Chloride: 102 mmol/L (ref 101–111)
Creatinine, Ser: 0.81 mg/dL (ref 0.44–1.00)
GFR calc non Af Amer: >60 mL/min (ref >60)
Glucose, Bld: 100 mg/dL — ABNORMAL HIGH (ref 65–99)
POTASSIUM: 4.1 mmol/L (ref 3.5–5.1)
Sodium: 136 mmol/L (ref 135–145)
TOTAL PROTEIN: 7.8 g/dL (ref 6.5–8.1)

## 2017-07-03 ENCOUNTER — Encounter: Payer: Self-pay | Admitting: Internal Medicine

## 2017-07-03 ENCOUNTER — Inpatient Hospital Stay (HOSPITAL_BASED_OUTPATIENT_CLINIC_OR_DEPARTMENT_OTHER): Payer: Managed Care, Other (non HMO) | Admitting: Internal Medicine

## 2017-07-03 ENCOUNTER — Other Ambulatory Visit: Payer: Self-pay

## 2017-07-03 DIAGNOSIS — Z86711 Personal history of pulmonary embolism: Secondary | ICD-10-CM

## 2017-07-03 DIAGNOSIS — Z86718 Personal history of other venous thrombosis and embolism: Secondary | ICD-10-CM | POA: Diagnosis not present

## 2017-07-03 DIAGNOSIS — D6859 Other primary thrombophilia: Secondary | ICD-10-CM | POA: Diagnosis not present

## 2017-07-03 MED ORDER — RIVAROXABAN 20 MG PO TABS: 20.0000 mg | ORAL_TABLET | Freq: Every day | ORAL | 6 refills | Status: DC

## 2017-07-03 NOTE — Assessment & Plan Note (Addendum)
Prior history of PE [provoked-long car ride/BCPs]; and also LE [below knee DVT- during post partum]; with a history of protein S deficiency [low antigen levels-and activity]; patient's d-dimer is elevated at 1000 [workup done after stopping Lovenox]-patient is recommended indefinite anticoagulation.  #Patient states that has stopped nursing; and is interested in coming off injections of Lovenox.  She has been on Xarelto in the past without any major problems.  Will initiate a new prescription for Xarelto.  Again discussed regarding avoiding falls/while on anticoagulation.  #Also reminded the patient not to get pregnant while being on Xarelto.[Plan pregnancy- AUG-sep 2019; when planning she will inform us- we will switch her over to Lovenox again.]   # follow up in mid aug 2019/labs.

## 2017-07-03 NOTE — Progress Notes (Signed)
[facility] CONSULT NOTE  Patient Care Team: Duanne Limerick, MD as PCP - General (Family Medicine)  CHIEF COMPLAINTS/PURPOSE OF CONSULTATION:   # June 2018- LE DVT [below knee; appx 10 weeks post partum]- on Lovenox; stop Sep 19th 2018; OCT 2018- PROTEIN S/ Activity- LOW;OFF ANti-COAG- D-DIMER- 1000; RECOMMENDED INDEFINITE ANTI-COAG  NOV 11th 2018- Re-START Lovenox 100 mg SQ/day; March 13th 2019- Xarelto 20mg /day   # 2013 SEP- Pulmonary embolus [BCPs/long car trip]- xarelto x 6 M; Brother Protein S deficiency.    No history exists.     HISTORY OF PRESENTING ILLNESS:  Michelle Hogan 32 y.o.  female with history of protein S deficiency and history of "provoked DVT/PE"-most recently on Lovenox therapeutic dose/indefinite anticoagulation is here for follow-up.  Patient was started on Lovenox November because patient was nursing.  Patient states that she decided to wean off nursing at this time.  No new blood clots. Denies any leg cramps or shortness of breath or cough. She is interested in 1 more pregnancy-planned sometime fall off 2019.   Patient states that she had a episode of "vasospasm of her right breast" a few months ago-this is currently resolved since stopping nursing.  She denies any bluish discoloration of fingertips or toes.  ROS: A complete 10 point review of system is done which is negative except mentioned above in history of present illness  MEDICAL HISTORY:  Past Medical History:  Diagnosis Date  . Depression   . DVT (deep venous thrombosis) (HCC)   . PE (pulmonary embolism)   . Protein S deficiency (HCC)     SURGICAL HISTORY: Past Surgical History:  Procedure Laterality Date  . ANKLE SURGERY Left   . CESAREAN SECTION N/A 07/17/2016   Procedure: C-Section;  Surgeon: Elenora Fender Ward, MD;  Location: [facility] ORS;  Service: Obstetrics;  Laterality: N/A;  . CHOLECYSTECTOMY N/A 04/23/2017   Procedure: LAPAROSCOPIC CHOLECYSTECTOMY;  Surgeon: Ancil Linsey, MD;  Location: [facility] ORS;  Service: General;  Laterality: N/A;    SOCIAL HISTORY: no smoking; stay home mom; in [place]. Rare alcohol Social History   Socioeconomic History  . Marital status: Married    Spouse name: Not on file  . Number of children: Not on file  . Years of education: Not on file  . Highest education level: Not on file  Social Needs  . Financial resource strain: Not on file  . Food insecurity - worry: Not on file  . Food insecurity - inability: Not on file  . Transportation needs - medical: Not on file  . Transportation needs - non-medical: Not on file  Occupational History  . Not on file  Tobacco Use  . Smoking status: Never Smoker  . Smokeless tobacco: Never Used  Substance and Sexual Activity  . Alcohol use: Yes    Alcohol/week: 0.6 oz    Types: 1 Cans of beer per week    Comment: rarely  . Drug use: No  . Sexual activity: Yes    Birth control/protection: IUD  Other Topics Concern  . Not on file  Social History Narrative  . Not on file    FAMILY HISTORY: mom's cousin 2022/10/18 died on blood clots]; brother blood clot; mom- no blood clots. Family History  Problem Relation Age of Onset  . Hyperlipidemia Mother   . Depression Mother   . Heart attack Father   . Heart disease Father   . Deep vein thrombosis Brother   . Diabetes Maternal Grandfather   .  Bladder Cancer Maternal Grandfather     ALLERGIES:  has No Known Allergies.  MEDICATIONS:  Current Outpatient Medications  Medication Sig Dispense Refill  . enoxaparin (LOVENOX) 100 MG/ML injection Inject 1 mL (100 mg total) daily into the skin. 60 Syringe 4  . levonorgestrel (MIRENA) 20 MCG/24HR IUD 1 each by Intrauterine route once.     . rivaroxaban (XARELTO) 20 MG TABS tablet Take 1 tablet (20 mg total) by mouth daily with supper. 30 tablet 6   No current facility-administered medications for this visit.       Marland Kitchen.  PHYSICAL EXAMINATION: ECOG PERFORMANCE STATUS: 0 - Asymptomatic  Vitals:    07/03/17 1330  BP: 118/80  Pulse: 67  Resp: 18  Temp: 97.9 F (36.6 C)   There were no vitals filed for this visit.  GENERAL: Well-nourished well-developed; Alert, no distress and comfortable. EYES: no pallor or icterus OROPHARYNX: no thrush or ulceration; good dentition  NECK: supple, no masses felt LYMPH:  no palpable lymphadenopathy in the cervical, axillary or inguinal regions LUNGS: clear to auscultation and  No wheeze or crackles HEART/CVS: regular rate & rhythm and no murmurs; No lower extremity edema ABDOMEN: abdomen soft, non-tender and normal bowel sounds Musculoskeletal:no cyanosis of digits and no clubbing  PSYCH: alert & oriented x 3 with fluent speech NEURO: no focal motor/sensory deficits SKIN:  no rashes or significant lesions  LABORATORY DATA:  I have reviewed the data as listed Lab Results  Component Value Date   WBC 5.6 06/29/2017   HGB 13.6 06/29/2017   HCT 39.3 06/29/2017   MCV 84.8 06/29/2017   PLT 276 06/29/2017   Recent Labs    10/01/16 1451 04/13/17 2224 06/29/17 1355  NA 140 137 136  K 4.0 3.8 4.1  CL 107 108 102  CO2 26 22 24   GLUCOSE 104* 96 100*  BUN 12 17 18   CREATININE 0.74 0.81 0.81  CALCIUM 9.4 8.9 9.2  GFRNONAA >60 >60 >60  GFRAA >60 >60 >60  PROT 7.7 7.4 7.8  ALBUMIN 4.5 4.1 4.5  AST 21 22 16   ALT 19 17 21   ALKPHOS 74 88 85  BILITOT 0.3 0.3 0.4    RADIOGRAPHIC STUDIES: I have personally reviewed the radiological images as listed and agreed with the findings in the report. No results found.  ASSESSMENT & PLAN:   Primary hypercoagulable state (HCC) Prior history of PE [provoked-long car ride/BCPs]; and also LE [below knee DVT- during post partum]; with a history of protein S deficiency [low antigen levels-and activity]; patient's d-dimer is elevated at 1000 [workup done after stopping Lovenox]-patient is recommended indefinite anticoagulation.  #Patient states that has stopped nursing; and is interested in coming off  injections of Lovenox.  She has been on Xarelto in the past without any major problems.  Will initiate a new prescription for Xarelto.  Again discussed regarding avoiding falls/while on anticoagulation.  #Also reminded the patient not to get pregnant while being on Xarelto.[Plan pregnancy- AUG-sep 2019; when planning she will inform us- we will switch her over to Lovenox again.]   # follow up in mid aug 2019/labs.   All questions were answered. The patient knows to call the clinic with any problems, questions or concerns.     Earna CoderGovinda R Deryl Giroux, MD 07/04/2017 7:42 AM

## 2017-08-21 ENCOUNTER — Other Ambulatory Visit: Payer: No Typology Code available for payment source

## 2017-08-28 ENCOUNTER — Ambulatory Visit: Payer: No Typology Code available for payment source | Admitting: Internal Medicine

## 2017-11-30 ENCOUNTER — Inpatient Hospital Stay: Payer: Managed Care, Other (non HMO)

## 2017-11-30 ENCOUNTER — Other Ambulatory Visit: Payer: Self-pay | Admitting: Internal Medicine

## 2017-11-30 DIAGNOSIS — D6859 Other primary thrombophilia: Secondary | ICD-10-CM

## 2017-12-03 ENCOUNTER — Inpatient Hospital Stay: Payer: Managed Care, Other (non HMO) | Attending: Internal Medicine

## 2017-12-03 DIAGNOSIS — Z86718 Personal history of other venous thrombosis and embolism: Secondary | ICD-10-CM | POA: Insufficient documentation

## 2017-12-03 DIAGNOSIS — Z7901 Long term (current) use of anticoagulants: Secondary | ICD-10-CM | POA: Insufficient documentation

## 2017-12-03 DIAGNOSIS — Z86711 Personal history of pulmonary embolism: Secondary | ICD-10-CM | POA: Insufficient documentation

## 2017-12-03 DIAGNOSIS — D6859 Other primary thrombophilia: Secondary | ICD-10-CM | POA: Diagnosis not present

## 2017-12-03 LAB — COMPREHENSIVE METABOLIC PANEL
ALBUMIN: 4.6 g/dL (ref 3.5–5.0)
ALK PHOS: 74 U/L (ref 38–126)
ALT: 16 U/L (ref 0–44)
ANION GAP: 11 (ref 5–15)
AST: 28 U/L (ref 15–41)
BILIRUBIN TOTAL: 0.7 mg/dL (ref 0.3–1.2)
BUN: 14 mg/dL (ref 6–20)
CO2: 20 mmol/L — ABNORMAL LOW (ref 22–32)
Calcium: 9.1 mg/dL (ref 8.9–10.3)
Chloride: 109 mmol/L (ref 98–111)
Creatinine, Ser: 0.72 mg/dL (ref 0.44–1.00)
GFR calc Af Amer: >60 mL/min (ref >60)
GFR calc non Af Amer: >60 mL/min (ref >60)
GLUCOSE: 114 mg/dL — AB (ref 70–99)
POTASSIUM: 3.7 mmol/L (ref 3.5–5.1)
SODIUM: 140 mmol/L (ref 135–145)
TOTAL PROTEIN: 8.3 g/dL — AB (ref 6.5–8.1)

## 2017-12-03 LAB — CBC WITH DIFFERENTIAL/PLATELET
Basophils Absolute: 0 10*3/uL (ref 0–0.1)
Basophils Relative: 0 %
EOS PCT: 2 %
Eosinophils Absolute: 0.1 10*3/uL (ref 0–0.7)
HEMATOCRIT: 43.5 % (ref 35.0–47.0)
Hemoglobin: 14.7 g/dL (ref 12.0–16.0)
LYMPHS PCT: 38 %
Lymphs Abs: 2.2 10*3/uL (ref 1.0–3.6)
MCH: 29 pg (ref 26.0–34.0)
MCHC: 33.9 g/dL (ref 32.0–36.0)
MCV: 85.6 fL (ref 80.0–100.0)
MONO ABS: 0.3 10*3/uL (ref 0.2–0.9)
Monocytes Relative: 5 %
NEUTROS ABS: 3.1 10*3/uL (ref 1.4–6.5)
Neutrophils Relative %: 55 %
PLATELETS: 286 10*3/uL (ref 150–440)
RBC: 5.08 MIL/uL (ref 3.80–5.20)
RDW: 13.2 % (ref 11.5–14.5)
WBC: 5.8 10*3/uL (ref 3.6–11.0)

## 2017-12-04 ENCOUNTER — Inpatient Hospital Stay: Payer: Managed Care, Other (non HMO) | Admitting: Internal Medicine

## 2017-12-11 ENCOUNTER — Encounter: Payer: Self-pay | Admitting: Internal Medicine

## 2017-12-11 ENCOUNTER — Inpatient Hospital Stay (HOSPITAL_BASED_OUTPATIENT_CLINIC_OR_DEPARTMENT_OTHER): Payer: Managed Care, Other (non HMO) | Admitting: Internal Medicine

## 2017-12-11 VITALS — BP 117/86 | HR 62 | Temp 97.1°F | Resp 16 | Wt 245.8 lb

## 2017-12-11 DIAGNOSIS — D6859 Other primary thrombophilia: Secondary | ICD-10-CM | POA: Diagnosis not present

## 2017-12-11 DIAGNOSIS — Z86711 Personal history of pulmonary embolism: Secondary | ICD-10-CM | POA: Diagnosis not present

## 2017-12-11 DIAGNOSIS — Z86718 Personal history of other venous thrombosis and embolism: Secondary | ICD-10-CM | POA: Diagnosis not present

## 2017-12-11 DIAGNOSIS — Z7901 Long term (current) use of anticoagulants: Secondary | ICD-10-CM

## 2017-12-11 MED ORDER — ENOXAPARIN SODIUM 150 MG/ML ~~LOC~~ SOLN: 150.0000 mg | SUBCUTANEOUS | 6 refills | Status: DC

## 2017-12-11 NOTE — Assessment & Plan Note (Addendum)
#   Prior history of PE [provoked-long car ride/BCPs]; and also LE [below knee DVT- during post partum]; with a history of protein S deficiency.  Patient will need indefinite anticoagulation.  #Patient is currently on Xarelto.  However given the upcoming pregnancy plans-we will discontinue Xarelto; and start Lovenox 150 mg subcu injection once a day.  #New prescription for Lovenox given.  Patient will start exactly 24 hours from her last Xarelto.  But the time of delivery patient will be followed by high risk  pregnancy  # follow up in 6 months/labs.

## 2017-12-11 NOTE — Progress Notes (Signed)
[facility] CONSULT NOTE  Patient Care Team: Duanne LimerickJones, Deanna C, MD as PCP - General (Family Medicine)  CHIEF COMPLAINTS/PURPOSE OF CONSULTATION:   # June 2018- LE DVT [below knee; appx 10 weeks post partum]- on Lovenox; stop Sep 19th 2018; OCT 2018- PROTEIN S/ Activity- LOW;OFF ANti-COAG- D-DIMER- 1000; RECOMMENDED INDEFINITE ANTI-COAG  NOV 11th 2018- Re-START Lovenox 100 mg SQ/day; March 13th 2019- Xarelto 20mg /day; AUG 20th 2019- STOP xarelto; START LOVENOX 150mg  SQ q day.   # 2013 SEP- Pulmonary embolus [BCPs/long car trip]- xarelto x 6 M; Brother Protein S deficiency.    No history exists.     HISTORY OF PRESENTING ILLNESS:  Michelle Hogan 32 y.o.  female with history of protein S deficiency and history of "provoked DVT/PE"-currently on indefinite anticoagulation is here for follow-up.  Patient is on Xarelto.  She denies any repeat blood clots or unusual shortness of breath or cough.  She denies having any blood in stools black or stools.  Patient is interested in getting pregnant.  She is having her IUD taken out next week.    ROS: A complete 10 point review of system is done which is negative except mentioned above in history of present illness  MEDICAL HISTORY:  Past Medical History:  Diagnosis Date  . Depression   . DVT (deep venous thrombosis) (HCC)   . PE (pulmonary embolism)   . Protein S deficiency (HCC)     SURGICAL HISTORY: Past Surgical History:  Procedure Laterality Date  . ANKLE SURGERY Left   . CESAREAN SECTION N/A 07/17/2016   Procedure: C-Section;  Surgeon: Elenora Fenderhelsea C Ward, MD;  Location: [facility] ORS;  Service: Obstetrics;  Laterality: N/A;  . CHOLECYSTECTOMY N/A 04/23/2017   Procedure: LAPAROSCOPIC CHOLECYSTECTOMY;  Surgeon: Ancil Linseyavis, Jason Evan, MD;  Location: [facility] ORS;  Service: General;  Laterality: N/A;    SOCIAL HISTORY: no smoking; stay home mom; in [place]. Rare alcohol Social History   Socioeconomic History  . Marital status: Married    Spouse name: Not on file  . Number of children: Not on file  . Years of education: Not on file  . Highest education level: Not on file  Occupational History  . Not on file  Social Needs  . Financial resource strain: Not on file  . Food insecurity:    Worry: Not on file    Inability: Not on file  . Transportation needs:    Medical: Not on file    Non-medical: Not on file  Tobacco Use  . Smoking status: Never Smoker  . Smokeless tobacco: Never Used  Substance and Sexual Activity  . Alcohol use: Yes    Alcohol/week: 1.0 standard drinks    Types: 1 Cans of beer per week    Comment: rarely  . Drug use: No  . Sexual activity: Yes    Birth control/protection: IUD  Lifestyle  . Physical activity:    Days per week: Not on file    Minutes per session: Not on file  . Stress: Not on file  Relationships  . Social connections:    Talks on phone: Not on file    Gets together: Not on file    Attends religious service: Not on file    Active member of club or organization: Not on file    Attends meetings of clubs or organizations: Not on file    Relationship status: Not on file  . Intimate partner violence:    Fear of current or ex partner: Not  on file    Emotionally abused: Not on file    Physically abused: Not on file    Forced sexual activity: Not on file  Other Topics Concern  . Not on file  Social History Narrative  . Not on file    FAMILY HISTORY: mom's cousin [29 died on blood clots]; brother blood clot; mom- no blood clots. Family History  Problem Relation Age of Onset  . Hyperlipidemia Mother   . Depression Mother   . Heart attack Father   . Heart disease Father   . Deep vein thrombosis Brother   . Diabetes Maternal Grandfather   . Bladder Cancer Maternal Grandfather     ALLERGIES:  has No Known Allergies.  MEDICATIONS:  Current Outpatient Medications  Medication Sig Dispense Refill  . levonorgestrel (MIRENA) 20 MCG/24HR IUD 1 each by Intrauterine route once.      . rivaroxaban (XARELTO) 20 MG TABS tablet Take 1 tablet (20 mg total) by mouth daily with supper. 30 tablet 6  . enoxaparin (LOVENOX) 150 MG/ML injection Inject 1 mL (150 mg total) into the skin daily. 30 Syringe 6   No current facility-administered medications for this visit.       Marland Kitchen.  PHYSICAL EXAMINATION: ECOG PERFORMANCE STATUS: 0 - Asymptomatic  Vitals:   12/11/17 1334 12/11/17 1336  BP:  117/86  Pulse:  62  Resp: 16 16  Temp:  (!) 97.1 F (36.2 C)   Filed Weights   12/11/17 1330  Weight: 245 lb 13 oz (111.5 kg)    GENERAL: Well-nourished well-developed; Alert, no distress and comfortable. EYES: no pallor or icterus OROPHARYNX: no thrush or ulceration; good dentition  NECK: supple, no masses felt LYMPH:  no palpable lymphadenopathy in the cervical, axillary or inguinal regions LUNGS: clear to auscultation and  No wheeze or crackles HEART/CVS: regular rate & rhythm and no murmurs; No lower extremity edema ABDOMEN: abdomen soft, non-tender and normal bowel sounds Musculoskeletal:no cyanosis of digits and no clubbing  PSYCH: alert & oriented x 3 with fluent speech NEURO: no focal motor/sensory deficits SKIN:  no rashes or significant lesions  LABORATORY DATA:  I have reviewed the data as listed Lab Results  Component Value Date   WBC 5.8 12/03/2017   HGB 14.7 12/03/2017   HCT 43.5 12/03/2017   MCV 85.6 12/03/2017   PLT 286 12/03/2017   Recent Labs    04/13/17 2224 06/29/17 1355 12/03/17 0951  NA 137 136 140  K 3.8 4.1 3.7  CL 108 102 109  CO2 22 24 20*  GLUCOSE 96 100* 114*  BUN 17 18 14   CREATININE 0.81 0.81 0.72  CALCIUM 8.9 9.2 9.1  GFRNONAA >60 >60 >60  GFRAA >60 >60 >60  PROT 7.4 7.8 8.3*  ALBUMIN 4.1 4.5 4.6  AST 22 16 28   ALT 17 21 16   ALKPHOS 88 85 74  BILITOT 0.3 0.4 0.7    RADIOGRAPHIC STUDIES: I have personally reviewed the radiological images as listed and agreed with the findings in the report. No results found.  ASSESSMENT  & PLAN:   Primary hypercoagulable state (HCC) # Prior history of PE [provoked-long car ride/BCPs]; and also LE [below knee DVT- during post partum]; with a history of protein S deficiency.  Patient will need indefinite anticoagulation.  #Patient is currently on Xarelto.  However given the upcoming pregnancy plans-we will discontinue Xarelto; and start Lovenox 150 mg subcu injection once a day.  #New prescription for Lovenox given.  Patient will start exactly  24 hours from her last Xarelto.  But the time of delivery patient will be followed by high risk  pregnancy  # follow up in 6 months/labs.   All questions were answered. The patient knows to call the clinic with any problems, questions or concerns.     Earna Coder, MD 12/11/2017 4:30 PM

## 2018-06-06 ENCOUNTER — Inpatient Hospital Stay: Payer: Managed Care, Other (non HMO) | Attending: Hematology and Oncology

## 2018-06-06 DIAGNOSIS — D6859 Other primary thrombophilia: Secondary | ICD-10-CM | POA: Insufficient documentation

## 2018-06-06 LAB — COMPREHENSIVE METABOLIC PANEL
ALBUMIN: 4.5 g/dL (ref 3.5–5.0)
ALT: 18 U/L (ref 0–44)
ANION GAP: 10 (ref 5–15)
AST: 19 U/L (ref 15–41)
Alkaline Phosphatase: 63 U/L (ref 38–126)
BILIRUBIN TOTAL: 0.4 mg/dL (ref 0.3–1.2)
BUN: 11 mg/dL (ref 6–20)
CHLORIDE: 106 mmol/L (ref 98–111)
CO2: 23 mmol/L (ref 22–32)
Calcium: 9.1 mg/dL (ref 8.9–10.3)
Creatinine, Ser: 0.68 mg/dL (ref 0.44–1.00)
GFR calc Af Amer: >60 mL/min (ref >60)
GFR calc non Af Amer: >60 mL/min (ref >60)
GLUCOSE: 100 mg/dL — AB (ref 70–99)
POTASSIUM: 3.6 mmol/L (ref 3.5–5.1)
SODIUM: 139 mmol/L (ref 135–145)
Total Protein: 7.3 g/dL (ref 6.5–8.1)

## 2018-06-06 LAB — CBC WITH DIFFERENTIAL/PLATELET
ABS IMMATURE GRANULOCYTES: 0.02 10*3/uL (ref 0.00–0.07)
BASOS PCT: 0 %
Basophils Absolute: 0 10*3/uL (ref 0.0–0.1)
Eosinophils Absolute: 0.1 10*3/uL (ref 0.0–0.5)
Eosinophils Relative: 2 %
HCT: 41.8 % (ref 36.0–46.0)
Hemoglobin: 14.3 g/dL (ref 12.0–15.0)
IMMATURE GRANULOCYTES: 0 %
LYMPHS ABS: 2.3 10*3/uL (ref 0.7–4.0)
Lymphocytes Relative: 39 %
MCH: 29.7 pg (ref 26.0–34.0)
MCHC: 34.2 g/dL (ref 30.0–36.0)
MCV: 86.9 fL (ref 80.0–100.0)
MONO ABS: 0.4 10*3/uL (ref 0.1–1.0)
MONOS PCT: 7 %
NEUTROS ABS: 3.1 10*3/uL (ref 1.7–7.7)
NEUTROS PCT: 52 %
Platelets: 266 10*3/uL (ref 150–400)
RBC: 4.81 MIL/uL (ref 3.87–5.11)
RDW: 12.8 % (ref 11.5–15.5)
WBC: 5.9 10*3/uL (ref 4.0–10.5)
nRBC: 0 % (ref 0.0–0.2)

## 2018-06-07 ENCOUNTER — Other Ambulatory Visit: Payer: Self-pay

## 2018-06-07 DIAGNOSIS — O871 Deep phlebothrombosis in the puerperium: Secondary | ICD-10-CM

## 2018-06-11 ENCOUNTER — Inpatient Hospital Stay (HOSPITAL_BASED_OUTPATIENT_CLINIC_OR_DEPARTMENT_OTHER): Payer: Managed Care, Other (non HMO) | Admitting: Hematology and Oncology

## 2018-06-11 ENCOUNTER — Ambulatory Visit: Payer: Managed Care, Other (non HMO) | Admitting: Internal Medicine

## 2018-06-11 ENCOUNTER — Encounter: Payer: Self-pay | Admitting: Hematology and Oncology

## 2018-06-11 VITALS — BP 120/81 | HR 71 | Temp 97.5°F | Resp 18 | Wt 246.5 lb

## 2018-06-11 DIAGNOSIS — D6859 Other primary thrombophilia: Secondary | ICD-10-CM

## 2018-06-11 DIAGNOSIS — N96 Recurrent pregnancy loss: Secondary | ICD-10-CM

## 2018-06-11 DIAGNOSIS — Z86718 Personal history of other venous thrombosis and embolism: Secondary | ICD-10-CM | POA: Diagnosis not present

## 2018-06-11 DIAGNOSIS — Z7901 Long term (current) use of anticoagulants: Secondary | ICD-10-CM

## 2018-06-11 DIAGNOSIS — Z86711 Personal history of pulmonary embolism: Secondary | ICD-10-CM | POA: Diagnosis not present

## 2018-06-11 NOTE — Progress Notes (Signed)
No new changes noted today 

## 2018-06-11 NOTE — Progress Notes (Signed)
[facility]     [address], [address]     [place], [place] [address]     Phone: [phone]      Fax: [phone]        Clinic day:  06/11/2018  Chief Complaint: Michelle Hogan is a 33 y.o. female with protein S deficiency and a history of DVT and pulmonary embolism who is seen for new patient assessment.  HPI:   The patient states that she went on a long car trip in 2013.  She was diagnosed with a pulmonary embolism at [facility] on 01/03/2012.  She was on Xarelto for 6 months.  She notes a pregnancy in 2016 and being on a Lovenox regimen (30 mg BID x 28 weeks then 40 mg BID for 6-8 weeks post-partum).  She developed superficial thrombosis after missed doses. She delivered on 05/12/2014.  She notes an unremarkable pregnancy and then delivery on 07/17/2016.  Ten weeks post partum she developed a DVT.  Right lower extremity duplex on 09/24/2016 revealed a DVT in the peroneal vein of the right calf.  She was on Lovenox during breast feeding.  Labs on 02/06/2017 revealed a protein S antigen of 40% and protein S activity of 13%.  The following studies were normal:  lupus anticoagulant panel, protein C total (112%), protein C activity (134%), and ATIII activity (114%).  D-dimers were 1018.11.  Plan was made for indefinite anticoagulation.  Patient last seen by Dr. Donneta Romberg on 12/11/2017.  At that time, she was on Xarelto.  She denied any shortness of breath or cough.  She denied any bleeding.  She was interested in getting pregnant.  She was to have her IUD taken out.  Because of her plans for pregnancy, Xarelto was discontinued and she was started on Lovenox 150 mg SQ q day.  She notes a recent miscarriage.    Symptomatically, she denies any excess bruising or bleeding.  Her mother and brother have protein S deficiency.  Her brother had a DVT at age 12.  Her grandfather had a clot.  Her cousin had a clot at age 75, but died of cardiac issues.   Past  Medical History:  Diagnosis Date  . Depression   . DVT (deep venous thrombosis) (HCC)   . PE (pulmonary embolism)   . Protein S deficiency [facility])     Past Surgical History:  Procedure Laterality Date  . ANKLE SURGERY Left   . CESAREAN SECTION N/A 07/17/2016   Procedure: C-Section;  Surgeon: Elenora Fender Ward, MD;  Location: [facility] ORS;  Service: Obstetrics;  Laterality: N/A;  . CHOLECYSTECTOMY N/A 04/23/2017   Procedure: LAPAROSCOPIC CHOLECYSTECTOMY;  Surgeon: Ancil Linsey, MD;  Location: [facility] ORS;  Service: General;  Laterality: N/A;    Family History  Problem Relation Age of Onset  . Hyperlipidemia Mother   . Depression Mother   . Heart attack Father   . Heart disease Father   . Deep vein thrombosis Brother   . Diabetes Maternal Grandfather   . Bladder Cancer Maternal Grandfather     Social History:  reports that she has never smoked. She has never used smokeless tobacco. She reports current alcohol use of about 1.0 standard drinks of alcohol per week. She reports that she does not use drugs.  She is a stay at home mom.  She has 2 sons.  She lives in [place].  The patient is alone today.  Allergies: No Known Allergies  Current  Medications: Current Outpatient Medications  Medication Sig Dispense Refill  . enoxaparin (LOVENOX) 150 MG/ML injection Inject 1 mL (150 mg total) into the skin daily. 30 Syringe 6  . levonorgestrel (MIRENA) 20 MCG/24HR IUD 1 each by Intrauterine route once.     Carlena Hurl 20 MG TABS tablet TAKE 1 TABLET (20 MG TOTAL) BY MOUTH DAILY WITH SUPPER. 30 tablet 2   No current facility-administered medications for this visit.     Review of Systems:  GENERAL:  Feels good.  No fevers, sweats or weight loss. PERFORMANCE STATUS (ECOG):  0 HEENT:  No visual changes, runny nose, sore throat, mouth sores or tenderness. Lungs: No shortness of breath or cough.  No hemoptysis. Cardiac:  No chest pain, palpitations, orthopnea, or PND. GI:  No nausea, vomiting,  diarrhea, constipation, melena or hematochezia. GU:  No urgency, frequency, dysuria, or hematuria. Musculoskeletal:  No back pain.  No joint pain.  No muscle tenderness. Extremities:  No pain or swelling. Skin:  No rashes or skin changes. Neuro:  No headache, numbness or weakness, balance or coordination issues. Endocrine:  No diabetes, thyroid issues, hot flashes or night sweats. Psych:  No mood changes, depression or anxiety. Pain:  No focal pain. Review of systems:  All other systems reviewed and found to be negative.  Physical Exam: Blood pressure 120/81, pulse 71, temperature (!) 97.5 F (36.4 C), temperature source Tympanic, resp. rate 18, weight 246 lb 7.6 oz (111.8 kg), SpO2 100 %, currently breastfeeding. GENERAL:  Well developed, well nourished, woman sitting comfortably in the exam room in no acute distress. MENTAL STATUS:  Alert and oriented to person, place and time. HEAD:  Brown hair pulled back.  Normocephalic, atraumatic, face symmetric, no Cushingoid features. EYES:  Green eyes.  Pupils equal round and reactive to light and accomodation.  No conjunctivitis or scleral icterus. ENT:  Oropharynx clear without lesion.  Tongue normal. Mucous membranes moist.  RESPIRATORY:  Clear to auscultation without rales, wheezes or rhonchi. CARDIOVASCULAR:  Regular rate and rhythm without murmur, rub or gallop. ABDOMEN:  Soft, non-tender, with active bowel sounds, and no hepatosplenomegaly.  No masses. SKIN:  Fair skin.  No rashes, ulcers or lesions. EXTREMITIES:  Chronic lower extremity changes.  No skin discoloration or tenderness.  No palpable cords. LYMPH NODES: No palpable cervical, supraclavicular, axillary or inguinal adenopathy  NEUROLOGICAL: Unremarkable. PSYCH:  Appropriate.   No visits with results within 3 Day(s) from this visit.  Latest known visit with results is:  Appointment on 06/06/2018  Component Date Value Ref Range Status  . Sodium 06/06/2018 139  135 - 145  mmol/L Final  . Potassium 06/06/2018 3.6  3.5 - 5.1 mmol/L Final  . Chloride 06/06/2018 106  98 - 111 mmol/L Final  . CO2 06/06/2018 23  22 - 32 mmol/L Final  . Glucose, Bld 06/06/2018 100* 70 - 99 mg/dL Final  . BUN 40/98/1191 11  6 - 20 mg/dL Final  . Creatinine, Ser 06/06/2018 0.68  0.44 - 1.00 mg/dL Final  . Calcium 47/82/9562 9.1  8.9 - 10.3 mg/dL Final  . Total Protein 06/06/2018 7.3  6.5 - 8.1 g/dL Final  . Albumin 13/11/6576 4.5  3.5 - 5.0 g/dL Final  . AST 46/96/2952 19  15 - 41 U/L Final  . ALT 06/06/2018 18  0 - 44 U/L Final  . Alkaline Phosphatase 06/06/2018 63  38 - 126 U/L Final  . Total Bilirubin 06/06/2018 0.4  0.3 - 1.2 mg/dL Final  . GFR  calc non Af Amer 06/06/2018 >60  >60 mL/min Final  . GFR calc Af Amer 06/06/2018 >60  >60 mL/min Final  . Anion gap 06/06/2018 10  5 - 15 Final   Performed at [facility], [address] [place], [place] [address]  . WBC 06/06/2018 5.9  4.0 - 10.5 K/uL Final  . RBC 06/06/2018 4.81  3.87 - 5.11 MIL/uL Final  . Hemoglobin 06/06/2018 14.3  12.0 - 15.0 g/dL Final  . HCT 29/56/213002/13/2020 41.8  36.0 - 46.0 % Final  . MCV 06/06/2018 86.9  80.0 - 100.0 fL Final  . MCH 06/06/2018 29.7  26.0 - 34.0 pg Final  . MCHC 06/06/2018 34.2  30.0 - 36.0 g/dL Final  . RDW 86/57/846902/13/2020 12.8  11.5 - 15.5 % Final  . Platelets 06/06/2018 266  150 - 400 K/uL Final  . nRBC 06/06/2018 0.0  0.0 - 0.2 % Final  . Neutrophils Relative % 06/06/2018 52  % Final  . Neutro Abs 06/06/2018 3.1  1.7 - 7.7 K/uL Final  . Lymphocytes Relative 06/06/2018 39  % Final  . Lymphs Abs 06/06/2018 2.3  0.7 - 4.0 K/uL Final  . Monocytes Relative 06/06/2018 7  % Final  . Monocytes Absolute 06/06/2018 0.4  0.1 - 1.0 K/uL Final  . Eosinophils Relative 06/06/2018 2  % Final  . Eosinophils Absolute 06/06/2018 0.1  0.0 - 0.5 K/uL Final  . Basophils Relative 06/06/2018 0  % Final  . Basophils Absolute 06/06/2018 0.0  0.0 - 0.1 K/uL Final  . Immature Granulocytes 06/06/2018 0  %  Final  . Abs Immature Granulocytes 06/06/2018 0.02  0.00 - 0.07 K/uL Final   Performed at [facility], [address] [place], [place] [address]    Assessment:  Michelle Hogan is a 33 y.o. female with protein S deficiency and a history of DVT and pulmonary embolism.  She is on indefinite anticoagulation.  She is on Lovenox.  She was diagnosed with a pulmonary embolism on 01/03/2012 after a long car trip.  She was on Xarelto for 6 months.  She developed a right lower extremity DVT in the peroneal vein on 09/24/2016 revealed a DVT in the peroneal vein of the right calf.    Work-up on 02/06/2017 revealed a protein S antigen of 40% and protein S activity of 13%.  Normal studies included:  lupus anticoagulant panel, protein C total (112%), protein C activity (134%), and ATIII activity (114%).  D-dimers were 1018.11.    Family history is notable for her mother and brother with protein S deficiency.  Her brother had a DVT at age 34.  Her grandfather and cousin had a clot (age 34).  Symptomatically, she denies any excess bruising or bleeding.  Exam reveals no adenopathy or hepatosplenomegaly.   Plan: 1.   Labs today:  CBC with diff, CMP 2.   Protein S deficiency  Review entire medical history, diagnosis and management pf protein S deficiency.  Patient has a history of DVT and PE.  Review hypercoagulable work-up.  Discuss life long anticoagulation.  Patient currently on Lovenox as she wishes to become pregnant.  Patient will also need to be on Lovenox during breast feeding. 3.   RTC in 3 months for labs (CBC with diff, CMP). 4.   RTC in 6 months for MD assessment and  labs (CBC with diff, CMP).  I discussed the assessment and treatment plan with the patient.  The patient was provided an opportunity  to ask questions and all were answered.  The patient agreed with the plan and demonstrated an understanding of the instructions.  The patient was advised to call back or seek an in person  evaluation if the symptoms worsen or if the condition fails to improve as anticipated.    Rosey Bath, MD  06/11/2018, 4:35 PM

## 2018-07-31 ENCOUNTER — Other Ambulatory Visit: Payer: Self-pay | Admitting: Hematology and Oncology

## 2018-07-31 ENCOUNTER — Telehealth: Payer: Self-pay

## 2018-07-31 DIAGNOSIS — Z86711 Personal history of pulmonary embolism: Secondary | ICD-10-CM

## 2018-07-31 MED ORDER — RIVAROXABAN 20 MG PO TABS: 20.0000 mg | ORAL_TABLET | Freq: Every day | ORAL | 2 refills | Status: DC

## 2018-07-31 NOTE — Telephone Encounter (Signed)
Informed patient Michelle Hogan has been sent to pharmacy. Advised Dr. Merlene Pulling would like for her to have labs prior to starting. Patient states she would like to come in the morning (08/01/2018) to have labs. Patient educated on using protection during sex to avoid possible pregnancy d/t risk associated with medication. Patient verbalizes understanding and denies any questions.

## 2018-07-31 NOTE — Telephone Encounter (Signed)
-----   Message from Rosey Bath, MD sent at 07/31/2018 11:28 AM EDT ----- Regarding: Please call patient Contact: [phone]  She can be on Xarelto again as long as she is not trying to get pregnant.  She has to be be vigilant about birth control.  What did she use in the past?  M ----- Message ----- From: Reggy Eye Sent: 07/31/2018   9:58 AM EDT To: Rosey Bath, MD  SHE HAS BEEN ON LOVENOX INJS FOR 6 MONTHS. SHE WANTS TO TAKE A SIX MONTH BREAK BECAUSE OF THE CORONA VIRUS THEY ARE STOPPING FOR ABOUT 6 MONTHS TO GET PREGNANT. SHE WANTS TO KNOW IF SHE CAN TAKE XARELTO INSTEAD

## 2018-08-01 ENCOUNTER — Other Ambulatory Visit: Payer: Self-pay

## 2018-08-01 ENCOUNTER — Inpatient Hospital Stay: Payer: Managed Care, Other (non HMO) | Attending: Hematology and Oncology

## 2018-08-01 DIAGNOSIS — D6859 Other primary thrombophilia: Secondary | ICD-10-CM | POA: Insufficient documentation

## 2018-08-01 LAB — COMPREHENSIVE METABOLIC PANEL
ALT: 12 U/L (ref 0–44)
AST: 15 U/L (ref 15–41)
Albumin: 4.4 g/dL (ref 3.5–5.0)
Alkaline Phosphatase: 53 U/L (ref 38–126)
Anion gap: 7 (ref 5–15)
BUN: 12 mg/dL (ref 6–20)
CO2: 20 mmol/L — ABNORMAL LOW (ref 22–32)
Calcium: 8.9 mg/dL (ref 8.9–10.3)
Chloride: 107 mmol/L (ref 98–111)
Creatinine, Ser: 0.61 mg/dL (ref 0.44–1.00)
GFR calc Af Amer: >60 mL/min (ref >60)
GFR calc non Af Amer: >60 mL/min (ref >60)
Glucose, Bld: 96 mg/dL (ref 70–99)
Potassium: 3.5 mmol/L (ref 3.5–5.1)
Sodium: 134 mmol/L — ABNORMAL LOW (ref 135–145)
Total Bilirubin: 0.5 mg/dL (ref 0.3–1.2)
Total Protein: 7.4 g/dL (ref 6.5–8.1)

## 2018-08-01 LAB — CBC WITH DIFFERENTIAL/PLATELET
Abs Immature Granulocytes: 0.01 10*3/uL (ref 0.00–0.07)
Basophils Absolute: 0 10*3/uL (ref 0.0–0.1)
Basophils Relative: 0 %
Eosinophils Absolute: 0.1 10*3/uL (ref 0.0–0.5)
Eosinophils Relative: 2 %
HCT: 39.8 % (ref 36.0–46.0)
Hemoglobin: 13.7 g/dL (ref 12.0–15.0)
Immature Granulocytes: 0 %
Lymphocytes Relative: 45 %
Lymphs Abs: 2.3 10*3/uL (ref 0.7–4.0)
MCH: 29.9 pg (ref 26.0–34.0)
MCHC: 34.4 g/dL (ref 30.0–36.0)
MCV: 86.9 fL (ref 80.0–100.0)
Monocytes Absolute: 0.3 10*3/uL (ref 0.1–1.0)
Monocytes Relative: 7 %
Neutro Abs: 2.3 10*3/uL (ref 1.7–7.7)
Neutrophils Relative %: 46 %
Platelets: 267 10*3/uL (ref 150–400)
RBC: 4.58 MIL/uL (ref 3.87–5.11)
RDW: 12.5 % (ref 11.5–15.5)
WBC: 5 10*3/uL (ref 4.0–10.5)
nRBC: 0 % (ref 0.0–0.2)

## 2018-08-23 ENCOUNTER — Other Ambulatory Visit: Payer: Self-pay | Admitting: Hematology and Oncology

## 2018-08-23 DIAGNOSIS — Z86711 Personal history of pulmonary embolism: Secondary | ICD-10-CM

## 2018-08-27 ENCOUNTER — Emergency Department: Payer: Managed Care, Other (non HMO)

## 2018-08-27 ENCOUNTER — Encounter: Payer: Self-pay | Admitting: Emergency Medicine

## 2018-08-27 ENCOUNTER — Emergency Department
Admission: EM | Admit: 2018-08-27 | Discharge: 2018-08-27 | Disposition: A | Discharge status: Home / Self Care | Payer: Managed Care, Other (non HMO) | Attending: Emergency Medicine | Admitting: Emergency Medicine

## 2018-08-27 ENCOUNTER — Other Ambulatory Visit: Payer: Self-pay

## 2018-08-27 DIAGNOSIS — M79605 Pain in left leg: Secondary | ICD-10-CM

## 2018-08-27 DIAGNOSIS — M79662 Pain in left lower leg: Secondary | ICD-10-CM | POA: Diagnosis not present

## 2018-08-27 DIAGNOSIS — Z86718 Personal history of other venous thrombosis and embolism: Secondary | ICD-10-CM | POA: Insufficient documentation

## 2018-08-27 DIAGNOSIS — Z79899 Other long term (current) drug therapy: Secondary | ICD-10-CM | POA: Diagnosis not present

## 2018-08-27 DIAGNOSIS — Z7901 Long term (current) use of anticoagulants: Secondary | ICD-10-CM | POA: Diagnosis not present

## 2018-08-27 NOTE — ED Triage Notes (Signed)
Pt here with left leg cramping that started yesterday afternoon, hx of DVT's, no swelling, no warmth at site. Is on blood thinner daily. NAD.

## 2018-08-27 NOTE — ED Notes (Signed)
MD at bedside to assess, patient awaiting u/s /o DVT

## 2018-08-27 NOTE — Discharge Instructions (Addendum)
Ultrasound was negative today, if continued or worsening pain may need repeat Ultrasound in 1 week

## 2018-08-27 NOTE — ED Notes (Signed)
Patient reports has been sitting more than usual. Hx of DVT's, feels like she is getting on or has one to left leg. C/o of cramping to left calf over 24 hours. No redness or warmth noted at site. Awaiting md eval and plan of care.

## 2018-08-27 NOTE — ED Provider Notes (Signed)
[facility] Emergency Department Provider Note   ____________________________________________    I have reviewed the triage vital signs and the nursing notes.   HISTORY  Chief Complaint Leg Pain     HPI Michelle Hogan is a 33 y.o. female with a history of DVT, PE, protein S deficiency who presents today with mild cramping in her posterior left lower leg.  She denies significant swelling.  She reports compliance with her Xarelto.  No pleurisy or shortness of breath.  No erythema or injury to the area.  Does not take anything for this.  Past Medical History:  Diagnosis Date  . Depression   . DVT (deep venous thrombosis) (HCC)   . PE (pulmonary embolism)   . Protein S deficiency [facility])     Patient Active Problem List   Diagnosis Date Noted  . Acute pulmonary embolism (HCC) 04/19/2017  . DVT (deep venous thrombosis) (HCC) 04/19/2017  . Labor and delivery, indication for care 07/17/2016  . Deficiency anemia 05/17/2016  . Primary hypercoagulable state (HCC) 01/04/2016  . High risk pregnancy, antepartum 12/20/2015  . Major depressive disorder, single episode, moderate (HCC) 08/18/2015  . Hyperlipidemia 08/12/2015  . FH: heart disease 08/12/2015  . Postpartum depression 08/12/2015  . Vitamin D deficiency 07/14/2015  . Obesity, Class II, BMI 35-39.9 07/12/2015  . Biliary dyskinesia 07/12/2015  . Hx pulmonary embolism 01/27/2015    Past Surgical History:  Procedure Laterality Date  . ANKLE SURGERY Left   . CESAREAN SECTION N/A 07/17/2016   Procedure: C-Section;  Surgeon: Elenora Fender Ward, MD;  Location: [facility] ORS;  Service: Obstetrics;  Laterality: N/A;  . CHOLECYSTECTOMY N/A 04/23/2017   Procedure: LAPAROSCOPIC CHOLECYSTECTOMY;  Surgeon: Ancil Linsey, MD;  Location: [facility] ORS;  Service: General;  Laterality: N/A;    Prior to Admission medications   Medication Sig Start Date End Date Taking? Authorizing Provider  enoxaparin (LOVENOX) 150  MG/ML injection Inject 1 mL (150 mg total) into the skin daily. 12/11/17   Earna Coder, MD  levonorgestrel (MIRENA) 20 MCG/24HR IUD 1 each by Intrauterine route once.     [provider]  XARELTO 20 MG TABS tablet TAKE 1 TABLET (20 MG TOTAL) BY MOUTH DAILY WITH SUPPER. 08/23/18   Rosey Bath, MD     Allergies Patient has no known allergies.  Family History  Problem Relation Age of Onset  . Hyperlipidemia Mother   . Depression Mother   . Heart attack Father   . Heart disease Father   . Deep vein thrombosis Brother   . Diabetes Maternal Grandfather   . Bladder Cancer Maternal Grandfather     Social History Social History   Tobacco Use  . Smoking status: Never Smoker  . Smokeless tobacco: Never Used  Substance Use Topics  . Alcohol use: Yes    Alcohol/week: 1.0 standard drinks    Types: 1 Cans of beer per week    Comment: rarely  . Drug use: No    Review of Systems  Constitutional: No fever/chills Eyes: No visual changes.  ENT: No sore throat. Cardiovascular: Denies chest pain. Respiratory: Denies shortness of breath. Gastrointestinal: No abdominal pain.  No nausea, no vomiting.   Genitourinary: Negative for dysuria. Musculoskeletal: As above Skin: Negative for rash. Neurological: Negative for headaches   ____________________________________________   PHYSICAL EXAM:  VITAL SIGNS: ED Triage Vitals [08/27/18 1226]  Enc Vitals Group     BP 116/81     Pulse Rate 80  Resp 18     Temp 98.9 F (37.2 C)     Temp Source Oral     SpO2 98 %     Weight 108.9 kg (240 lb)     Height 1.651 m (5\' 5" )     Head Circumference      Peak Flow      Pain Score 2     Pain Loc      Pain Edu?      Excl. in GC?     Constitutional: Alert and oriented. No acute distress. Pleasant and interactive   Mouth/Throat: Mucous membranes are moist.    Cardiovascular: Normal rate, regular rhythm.   Good peripheral circulation. Respiratory: Normal  respiratory effort.  No retractions.  Gastrointestinal: Soft and nontender. No distention.  No CVA tenderness. Genitourinary: deferred Musculoskeletal: Unremarkable lower extremity exams, no significant swelling, well perfused and warm Neurologic:  Normal speech and language. No gross focal neurologic deficits are appreciated.  Skin:  Skin is warm, dry and intact. No rash noted. Psychiatric: Mood and affect are normal. Speech and behavior are normal.  ____________________________________________   LABS (all labs ordered are listed, but only abnormal results are displayed)  Labs Reviewed - No data to display ____________________________________________  EKG  None ____________________________________________  RADIOLOGY Ultrasound left lower extremity unremarkable ____________________________________________   PROCEDURES  Procedure(s) performed: No  Procedures   Critical Care performed: No ____________________________________________   INITIAL IMPRESSION / ASSESSMENT AND PLAN / ED COURSE  Pertinent labs & imaging results that were available during my care of the patient were reviewed by me and considered in my medical decision making (see chart for details).  Patient on Xarelto, history of protein S deficiency, left leg cramping will send for ultrasound to evaluate for DVT   Sound negative for DVT, patient is reassured, recommended if worsening symptoms or continued symptoms repeat ultrasound in 7 to 14 days    ____________________________________________   FINAL CLINICAL IMPRESSION(S) / ED DIAGNOSES  Final diagnoses:  Left leg pain        Note:  This document was prepared using Dragon voice recognition software and may include unintentional dictation errors.   Jene EveryKinner, Malu Pellegrini, MD 08/27/18 1504

## 2018-09-10 ENCOUNTER — Other Ambulatory Visit: Payer: Managed Care, Other (non HMO)

## 2018-09-17 ENCOUNTER — Other Ambulatory Visit: Payer: Self-pay | Admitting: Hematology and Oncology

## 2018-09-17 DIAGNOSIS — Z86711 Personal history of pulmonary embolism: Secondary | ICD-10-CM

## 2018-09-17 NOTE — Telephone Encounter (Signed)
  Please call patient.  Patient should have refills.  M

## 2018-10-05 ENCOUNTER — Encounter: Payer: Self-pay | Admitting: Hematology and Oncology

## 2018-10-08 ENCOUNTER — Inpatient Hospital Stay: Payer: Managed Care, Other (non HMO) | Attending: Hematology and Oncology

## 2018-10-08 ENCOUNTER — Other Ambulatory Visit: Payer: Self-pay

## 2018-10-08 DIAGNOSIS — D6859 Other primary thrombophilia: Secondary | ICD-10-CM | POA: Diagnosis not present

## 2018-10-08 LAB — COMPREHENSIVE METABOLIC PANEL
ALT: 11 U/L (ref 0–44)
AST: 13 U/L — ABNORMAL LOW (ref 15–41)
Albumin: 4.2 g/dL (ref 3.5–5.0)
Alkaline Phosphatase: 51 U/L (ref 38–126)
Anion gap: 7 (ref 5–15)
BUN: 12 mg/dL (ref 6–20)
CO2: 23 mmol/L (ref 22–32)
Calcium: 8.8 mg/dL — ABNORMAL LOW (ref 8.9–10.3)
Chloride: 104 mmol/L (ref 98–111)
Creatinine, Ser: 0.78 mg/dL (ref 0.44–1.00)
GFR calc Af Amer: >60 mL/min (ref >60)
GFR calc non Af Amer: >60 mL/min (ref >60)
Glucose, Bld: 101 mg/dL — ABNORMAL HIGH (ref 70–99)
Potassium: 3.8 mmol/L (ref 3.5–5.1)
Sodium: 134 mmol/L — ABNORMAL LOW (ref 135–145)
Total Bilirubin: 0.7 mg/dL (ref 0.3–1.2)
Total Protein: 7.2 g/dL (ref 6.5–8.1)

## 2018-10-08 LAB — CBC WITH DIFFERENTIAL/PLATELET
Abs Immature Granulocytes: 0.01 10*3/uL (ref 0.00–0.07)
Basophils Absolute: 0 10*3/uL (ref 0.0–0.1)
Basophils Relative: 0 %
Eosinophils Absolute: 0.2 10*3/uL (ref 0.0–0.5)
Eosinophils Relative: 3 %
HCT: 39.6 % (ref 36.0–46.0)
Hemoglobin: 13.6 g/dL (ref 12.0–15.0)
Immature Granulocytes: 0 %
Lymphocytes Relative: 34 %
Lymphs Abs: 2 10*3/uL (ref 0.7–4.0)
MCH: 30.1 pg (ref 26.0–34.0)
MCHC: 34.3 g/dL (ref 30.0–36.0)
MCV: 87.6 fL (ref 80.0–100.0)
Monocytes Absolute: 0.5 10*3/uL (ref 0.1–1.0)
Monocytes Relative: 8 %
Neutro Abs: 3.4 10*3/uL (ref 1.7–7.7)
Neutrophils Relative %: 55 %
Platelets: 247 10*3/uL (ref 150–400)
RBC: 4.52 MIL/uL (ref 3.87–5.11)
RDW: 12.7 % (ref 11.5–15.5)
WBC: 6 10*3/uL (ref 4.0–10.5)
nRBC: 0 % (ref 0.0–0.2)

## 2018-12-02 ENCOUNTER — Other Ambulatory Visit: Payer: Self-pay

## 2018-12-02 DIAGNOSIS — O871 Deep phlebothrombosis in the puerperium: Secondary | ICD-10-CM

## 2018-12-02 DIAGNOSIS — D6859 Other primary thrombophilia: Secondary | ICD-10-CM

## 2018-12-02 DIAGNOSIS — Z86711 Personal history of pulmonary embolism: Secondary | ICD-10-CM

## 2018-12-04 ENCOUNTER — Other Ambulatory Visit: Payer: Self-pay

## 2018-12-05 ENCOUNTER — Inpatient Hospital Stay: Payer: Managed Care, Other (non HMO) | Attending: Hematology and Oncology

## 2018-12-05 DIAGNOSIS — Z7901 Long term (current) use of anticoagulants: Secondary | ICD-10-CM | POA: Insufficient documentation

## 2018-12-05 DIAGNOSIS — D6859 Other primary thrombophilia: Secondary | ICD-10-CM | POA: Diagnosis present

## 2018-12-05 DIAGNOSIS — Z79899 Other long term (current) drug therapy: Secondary | ICD-10-CM | POA: Diagnosis not present

## 2018-12-05 DIAGNOSIS — Z86718 Personal history of other venous thrombosis and embolism: Secondary | ICD-10-CM | POA: Diagnosis not present

## 2018-12-05 DIAGNOSIS — Z86711 Personal history of pulmonary embolism: Secondary | ICD-10-CM | POA: Insufficient documentation

## 2018-12-05 DIAGNOSIS — O871 Deep phlebothrombosis in the puerperium: Secondary | ICD-10-CM

## 2018-12-05 LAB — COMPREHENSIVE METABOLIC PANEL
ALT: 11 U/L (ref 0–44)
AST: 14 U/L — ABNORMAL LOW (ref 15–41)
Albumin: 4 g/dL (ref 3.5–5.0)
Alkaline Phosphatase: 48 U/L (ref 38–126)
Anion gap: 6 (ref 5–15)
BUN: 11 mg/dL (ref 6–20)
CO2: 24 mmol/L (ref 22–32)
Calcium: 9.3 mg/dL (ref 8.9–10.3)
Chloride: 107 mmol/L (ref 98–111)
Creatinine, Ser: 0.68 mg/dL (ref 0.44–1.00)
GFR calc Af Amer: >60 mL/min (ref >60)
GFR calc non Af Amer: >60 mL/min (ref >60)
Glucose, Bld: 98 mg/dL (ref 70–99)
Potassium: 3.7 mmol/L (ref 3.5–5.1)
Sodium: 137 mmol/L (ref 135–145)
Total Bilirubin: 0.2 mg/dL — ABNORMAL LOW (ref 0.3–1.2)
Total Protein: 6.8 g/dL (ref 6.5–8.1)

## 2018-12-05 LAB — CBC WITH DIFFERENTIAL/PLATELET
Abs Immature Granulocytes: 0.02 10*3/uL (ref 0.00–0.07)
Basophils Absolute: 0 10*3/uL (ref 0.0–0.1)
Basophils Relative: 0 %
Eosinophils Absolute: 0.1 10*3/uL (ref 0.0–0.5)
Eosinophils Relative: 2 %
HCT: 38 % (ref 36.0–46.0)
Hemoglobin: 13 g/dL (ref 12.0–15.0)
Immature Granulocytes: 0 %
Lymphocytes Relative: 38 %
Lymphs Abs: 2.2 10*3/uL (ref 0.7–4.0)
MCH: 29.7 pg (ref 26.0–34.0)
MCHC: 34.2 g/dL (ref 30.0–36.0)
MCV: 86.8 fL (ref 80.0–100.0)
Monocytes Absolute: 0.4 10*3/uL (ref 0.1–1.0)
Monocytes Relative: 7 %
Neutro Abs: 3.2 10*3/uL (ref 1.7–7.7)
Neutrophils Relative %: 53 %
Platelets: 232 10*3/uL (ref 150–400)
RBC: 4.38 MIL/uL (ref 3.87–5.11)
RDW: 12.4 % (ref 11.5–15.5)
WBC: 6 10*3/uL (ref 4.0–10.5)
nRBC: 0 % (ref 0.0–0.2)

## 2018-12-12 ENCOUNTER — Ambulatory Visit: Payer: Managed Care, Other (non HMO)

## 2018-12-17 ENCOUNTER — Other Ambulatory Visit: Payer: Self-pay

## 2018-12-17 NOTE — Progress Notes (Signed)
Confirmed Name, DOB, and Address. Denies any concerns. Patient would like to discuss starting back on Lovenox d/t wanting to get pregnant.

## 2018-12-17 NOTE — Progress Notes (Signed)
[facility]  [address], [address] [place], [place] [address] Phone: [phone]  Fax: [phone]   Clinic Day:  12/18/2018  Referring physician: Duanne LimerickJones, Deanna C, MD  Chief Complaint: Michelle Pondsmanda D Hogan is a 33 y.o. female with protein S deficiency and a history of DVT and pulmonary embolism who is seen for 6 month assessment.  HPI: The patient was last seen in the hematology clinic on 06/11/2018. At that time, she denied any excess bruising or bleeding.  Exam revealed no adenopathy or hepatosplenomegaly. She continued on Lovenox.   She switched from Lovenox injections to Xarelto as she was taking a break from attempting to become pregnant on 07/31/2018.   [facility] 08[facility]27/2018 for left leg cramps. Duplex of the LLE was unremarkable.   Labs followed: 08/01/2018: hematocrit 39.8, hemoglobin 13.7, MCV 86.9, platelets 267,000, WBC 5,000. Sodium 134.   10/08/2018: hematocrit 39.6, hemoglobin 13.6, MCV 87.6, platelets 247,000, WBC 6,000. Sodium 134.  Calcium 8.8. 12/05/2018: hematocrit 38.0, hemoglobin 13.0, MCV 86.8, platelets 232,000, WBC 6,000. Sodium 137.  Calcium 9.3.  During the interim, she has been doing "fine".  She describes being in locked down with her children during the COVID-19 pandemic.  He continues on Xarelto.  She denies any shortness of breath, lower extremity edema or increased bruising or bleeding.  She states that she is ready to resume trying to get pregnant and would like to switch to Lovenox.   Past Medical History:  Diagnosis Date  . Depression   . DVT (deep venous thrombosis) (HCC)   . PE (pulmonary embolism)   . Protein S deficiency [facility](HCC)     Past Surgical History:  Procedure Laterality Date  . ANKLE SURGERY Left   . CESAREAN SECTION N/A 07/17/2016   Procedure: C-Section;  Surgeon: Elenora Fenderhelsea C Ward, MD;  Location: [facility] ORS;  Service: Obstetrics;  Laterality: N/A;  . CHOLECYSTECTOMY N/A 04/23/2017   Procedure:  LAPAROSCOPIC CHOLECYSTECTOMY;  Surgeon: Ancil Linseyavis, Jason Evan, MD;  Location: [facility] ORS;  Service: General;  Laterality: N/A;    Family History  Problem Relation Age of Onset  . Hyperlipidemia Mother   . Depression Mother   . Heart attack Father   . Heart disease Father   . Deep vein thrombosis Brother   . Diabetes Maternal Grandfather   . Bladder Cancer Maternal Grandfather     Social History:  reports that she has never smoked. She has never used smokeless tobacco. She reports current alcohol use of about 1.0 standard drinks of alcohol per week. She reports that she does not use drugs. She is a stay at home mom.  She has 2 sons (ages 174 and 2).  [place].  The patient is alone today.  Allergies: No Known Allergies  Current Medications: Current Outpatient Medications  Medication Sig Dispense Refill  . cholecalciferol (VITAMIN D3) 25 MCG (1000 UT) tablet Take 2,000 Units by mouth daily.     Marland Kitchen. escitalopram (LEXAPRO) 20 MG tablet Take 1 tablet by mouth daily.    . vitamin B-12 (CYANOCOBALAMIN) 1000 MCG tablet Take 2,000 mcg by mouth daily.     Carlena Hurl. XARELTO 20 MG TABS tablet TAKE 1 TABLET (20 MG TOTAL) BY MOUTH DAILY WITH SUPPER. 30 tablet 2  . enoxaparin (LOVENOX) 120 MG/0.8ML injection Inject 0.73 mLs (110 mg total) into the skin every 12 (twelve) hours for 60 doses. 43.8 mL 1  . enoxaparin (LOVENOX) 150 MG/ML injection Inject 1 mL (  150 mg total) into the skin daily. (Patient not taking: Reported on 12/17/2018) 30 Syringe 6   No current facility-administered medications for this visit.     Review of Systems  Constitutional: Positive for weight loss (2 pounds). Negative for chills, diaphoresis, fever and malaise/fatigue.       Feels "fine".  HENT: Negative.  Negative for congestion, ear pain, hearing loss, nosebleeds, sinus pain and sore throat.   Eyes: Negative.  Negative for blurred vision, double vision and pain.  Respiratory: Negative.  Negative for cough, shortness of  breath and wheezing.   Cardiovascular: Negative.  Negative for chest pain, palpitations, orthopnea, leg swelling and PND.  Gastrointestinal: Negative.  Negative for abdominal pain, blood in stool, constipation, diarrhea, melena, nausea and vomiting.  Genitourinary: Negative.  Negative for dysuria, frequency, hematuria and urgency.  Musculoskeletal: Negative.  Negative for back pain, joint pain and myalgias.  Skin: Negative.  Negative for rash.  Neurological: Negative.  Negative for dizziness, tingling, sensory change, speech change, focal weakness, weakness and headaches.  Endo/Heme/Allergies: Negative.  Does not bruise/bleed easily.  Psychiatric/Behavioral: Negative.  Negative for depression, memory loss and substance abuse. The patient is not nervous/anxious and does not have insomnia.   All other systems reviewed and are negative.  Performance status (ECOG): 0  Vitals Blood pressure 109/65, pulse 73, temperature (!) 96.9 F (36.1 C), temperature source Tympanic, resp. rate 18, height 5\' 5"  (1.651 m), weight 244 lb 2.6 oz (110.7 kg), SpO2 100 %, currently breastfeeding.   Physical Exam  Constitutional: She is oriented to person, place, and time. She appears well-developed and well-nourished. No distress.  HENT:  Head: Normocephalic and atraumatic.  Nose: Nose normal.  Mouth/Throat: Oropharynx is clear and moist. No oropharyngeal exudate.  Brown hair pulled back.  Mask.  Eyes: Pupils are equal, round, and reactive to light. Conjunctivae and EOM are normal. No scleral icterus.  Blue eyes.  Neck: Neck supple. No JVD present. No thyromegaly present.  Cardiovascular: Normal rate, regular rhythm and normal heart sounds. Exam reveals no gallop.  No murmur heard. Pulmonary/Chest: Effort normal and breath sounds normal. No respiratory distress. She has no wheezes. She has no rales.  Abdominal: Soft. Bowel sounds are normal. She exhibits no distension and no mass. There is no abdominal  tenderness. There is no rebound and no guarding.  Musculoskeletal: Normal range of motion.        General: No edema.  Lymphadenopathy:    She has no cervical adenopathy.    She has no axillary adenopathy.       Right: No supraclavicular adenopathy present.       Left: No supraclavicular adenopathy present.  Neurological: She is oriented to person, place, and time. Coordination normal.  Skin: Skin is warm and dry. No rash noted. She is not diaphoretic. No erythema. No pallor.  Psychiatric: She has a normal mood and affect. Her behavior is normal. Judgment and thought content normal.  Nursing note and vitals reviewed.   No visits with results within 3 Day(s) from this visit.  Latest known visit with results is:  Appointment on 12/05/2018  Component Date Value Ref Range Status  . Sodium 12/05/2018 137  135 - 145 mmol/L Final  . Potassium 12/05/2018 3.7  3.5 - 5.1 mmol/L Final  . Chloride 12/05/2018 107  98 - 111 mmol/L Final  . CO2 12/05/2018 24  22 - 32 mmol/L Final  . Glucose, Bld 12/05/2018 98  70 - 99 mg/dL Final  . BUN 12/05/2018  11  6 - 20 mg/dL Final  . Creatinine, Ser 12/05/2018 0.68  0.44 - 1.00 mg/dL Final  . Calcium 65/79/0383 9.3  8.9 - 10.3 mg/dL Final  . Total Protein 12/05/2018 6.8  6.5 - 8.1 g/dL Final  . Albumin 33/83/2919 4.0  3.5 - 5.0 g/dL Final  . AST 16/60/6004 14* 15 - 41 U/L Final  . ALT 12/05/2018 11  0 - 44 U/L Final  . Alkaline Phosphatase 12/05/2018 48  38 - 126 U/L Final  . Total Bilirubin 12/05/2018 0.2* 0.3 - 1.2 mg/dL Final  . GFR calc non Af Amer 12/05/2018 >60  >60 mL/min Final  . GFR calc Af Amer 12/05/2018 >60  >60 mL/min Final  . Anion gap 12/05/2018 6  5 - 15 Final   Performed at [facility], [address] [place], [place] [address]  . WBC 12/05/2018 6.0  4.0 - 10.5 K/uL Final  . RBC 12/05/2018 4.38  3.87 - 5.11 MIL/uL Final  . Hemoglobin 12/05/2018 13.0  12.0 - 15.0 g/dL Final  . HCT 41/42/3953 38.0  36.0 - 46.0 % Final  .  MCV 12/05/2018 86.8  80.0 - 100.0 fL Final  . MCH 12/05/2018 29.7  26.0 - 34.0 pg Final  . MCHC 12/05/2018 34.2  30.0 - 36.0 g/dL Final  . RDW 20/23/3435 12.4  11.5 - 15.5 % Final  . Platelets 12/05/2018 232  150 - 400 K/uL Final  . nRBC 12/05/2018 0.0  0.0 - 0.2 % Final  . Neutrophils Relative % 12/05/2018 53  % Final  . Neutro Abs 12/05/2018 3.2  1.7 - 7.7 K/uL Final  . Lymphocytes Relative 12/05/2018 38  % Final  . Lymphs Abs 12/05/2018 2.2  0.7 - 4.0 K/uL Final  . Monocytes Relative 12/05/2018 7  % Final  . Monocytes Absolute 12/05/2018 0.4  0.1 - 1.0 K/uL Final  . Eosinophils Relative 12/05/2018 2  % Final  . Eosinophils Absolute 12/05/2018 0.1  0.0 - 0.5 K/uL Final  . Basophils Relative 12/05/2018 0  % Final  . Basophils Absolute 12/05/2018 0.0  0.0 - 0.1 K/uL Final  . Immature Granulocytes 12/05/2018 0  % Final  . Abs Immature Granulocytes 12/05/2018 0.02  0.00 - 0.07 K/uL Final   Performed at [facility], [address] [place], [place] [address]    Assessment:  Michelle Hogan is a 33 y.o. female with protein S deficiency and a history of DVT and pulmonary embolism.  She is on indefinite anticoagulation.  She is on Lovenox.  She was diagnosed with a pulmonary embolism on 01/03/2012 after a long car trip.  She was on Xarelto for 6 months.  She developed a right lower extremity DVT in the peroneal vein on 09/24/2016 revealed a DVT in the peroneal vein of the right calf.    Work-up on 02/06/2017 revealed a protein S antigen of 40% and protein S activity of 13%.  Normal studies included:  lupus anticoagulant panel, protein C total (112%), protein C activity (134%), and ATIII activity (114%).  D-dimers were 1018.11.    Family history is notable for her mother and brother with protein S deficiency.  Her brother had a DVT at age 60.  Her grandfather and cousin had a clot (age 56).  Symptomatically, he is doing well.  She is interested in becoming pregnant again.  She  is currently on Xarelto.  Plan: 1.   Review interval labs. 2.   Protein S deficiency  Patient has a history of DVT and PE.             Patient is on life long anticoagulation.             Patient is currently on Xarelto.             Discuss switch from Xarelto to Lovenox as she wishes to become pregnant.  Discuss discontinuation of Xarelto and beginning Lovenox 1 mg/kg twice daily the following day.  Patient states that she has Lovenox 150 mg syringes.  Lovenox dose is 110 mg SQ q 12 hours based on her weight of 110 kg.  Return to clinic for nurse education- discard some of Lovenox (use 0.73 ml of the 1 ml syringe).  Rx for Lovenox 120 mg syringes- nurse to review changes in discard amount. 3.     RTC in 3 months for MD assessment and labs (CBC with diff, CMP).   I discussed the assessment and treatment plan with the patient.  The patient was provided an opportunity to ask questions and all were answered.  The patient agreed with the plan and demonstrated an understanding of the instructions.  The patient was advised to call back if the symptoms worsen or if the condition fails to improve as anticipated.   Rosey BathMelissa C , MD, PhD    12/18/2018, 3:18 PM

## 2018-12-18 ENCOUNTER — Inpatient Hospital Stay (HOSPITAL_BASED_OUTPATIENT_CLINIC_OR_DEPARTMENT_OTHER): Payer: Managed Care, Other (non HMO) | Admitting: Hematology and Oncology

## 2018-12-18 ENCOUNTER — Encounter: Payer: Self-pay | Admitting: Hematology and Oncology

## 2018-12-18 VITALS — BP 109/65 | HR 73 | Temp 96.9°F | Resp 18 | Ht 65.0 in | Wt 244.2 lb

## 2018-12-18 DIAGNOSIS — Z86718 Personal history of other venous thrombosis and embolism: Secondary | ICD-10-CM | POA: Diagnosis not present

## 2018-12-18 DIAGNOSIS — I825Y1 Chronic embolism and thrombosis of unspecified deep veins of right proximal lower extremity: Secondary | ICD-10-CM

## 2018-12-18 DIAGNOSIS — Z7901 Long term (current) use of anticoagulants: Secondary | ICD-10-CM

## 2018-12-18 DIAGNOSIS — D6859 Other primary thrombophilia: Secondary | ICD-10-CM | POA: Diagnosis not present

## 2018-12-18 NOTE — Progress Notes (Signed)
No new changes noted today 

## 2018-12-20 MED ORDER — ENOXAPARIN SODIUM 120 MG/0.8ML ~~LOC~~ SOLN: 110.0000 mg | Freq: Two times a day (BID) | SUBCUTANEOUS | 1 refills | Status: DC

## 2019-03-12 ENCOUNTER — Inpatient Hospital Stay: Payer: Managed Care, Other (non HMO)

## 2019-03-12 ENCOUNTER — Inpatient Hospital Stay: Payer: Managed Care, Other (non HMO) | Attending: Hematology and Oncology | Admitting: Hematology and Oncology

## 2019-04-14 ENCOUNTER — Other Ambulatory Visit: Payer: Self-pay

## 2019-04-14 ENCOUNTER — Ambulatory Visit
Admission: RE | Admit: 2019-04-14 | Discharge: 2019-04-14 | Disposition: A | Discharge status: Home / Self Care | Payer: Managed Care, Other (non HMO) | Source: Ambulatory Visit | Attending: Maternal & Fetal Medicine | Admitting: Maternal & Fetal Medicine

## 2019-04-14 VITALS — BP 110/82 | HR 85 | Temp 98.4°F | Wt 241.0 lb

## 2019-04-14 DIAGNOSIS — I825Y1 Chronic embolism and thrombosis of unspecified deep veins of right proximal lower extremity: Secondary | ICD-10-CM

## 2019-04-14 DIAGNOSIS — E669 Obesity, unspecified: Secondary | ICD-10-CM

## 2019-04-14 DIAGNOSIS — Z7901 Long term (current) use of anticoagulants: Secondary | ICD-10-CM

## 2019-04-14 DIAGNOSIS — I2699 Other pulmonary embolism without acute cor pulmonale: Secondary | ICD-10-CM

## 2019-04-14 DIAGNOSIS — Z3A09 9 weeks gestation of pregnancy: Secondary | ICD-10-CM

## 2019-04-14 DIAGNOSIS — O0990 Supervision of high risk pregnancy, unspecified, unspecified trimester: Secondary | ICD-10-CM

## 2019-04-14 DIAGNOSIS — K828 Other specified diseases of gallbladder: Secondary | ICD-10-CM

## 2019-04-14 DIAGNOSIS — Z86711 Personal history of pulmonary embolism: Secondary | ICD-10-CM

## 2019-04-14 DIAGNOSIS — D6859 Other primary thrombophilia: Secondary | ICD-10-CM

## 2019-04-14 NOTE — Progress Notes (Signed)
[facility] Consultation   Chief Complaint: History of recurrent VTE  HPI: Ms. Michelle Hogan is a 33 y.o. G3P2002 at [redacted]w[redacted]Michelle by LMP confirmed by [redacted]w[redacted]Michelle [place] at [facility] who presents in consultation from  for Michelle Hogan and  Michelle Hogan at [facility] Michelle Hogan is a 33 y.o. female with protein S deficiencyand a history of pulmonary embolism and DVT.She is on indefinite anticoagulation. She is on Lovenox.  Dr. Merlene Pulling prescribe 110 mg sub cutaneous q 12 hours, but the patient misunderstood and has only been taking her Lovenox 110 mg qd.     She was diagnosed with a pulmonary embolismon 01/03/2012 after a long car trip. She was on Xareltofor 6 months.She developeda right lower extremity DVTin the peroneal vein on 09/24/2016 revealed a DVT in the peroneal vein of the right calf.   A work-up on 02/06/2017 revealed a protein S antigenof 40% and protein S activityof 13%. (Protein S had previously been normal in 2015.)Normal studiesincluded: lupus anticoagulant panel, protein C total (112%), protein C activity (134%), and ATIII activity (114%). Michelle-dimers were 1018.11.   Family history is notable for her mother and brother with protein S deficiency. Her brother had a DVT at age 35. Her grandfather and cousin had a clot (age 9). Obstetric History:  OB History  Gravida Para Term Preterm AB Living  3 2 2  0 0 2  SAB TAB Ectopic Multiple Live Births  0 0 0 0 1    # Outcome Date GA Lbr Len/2nd Weight Sex Delivery Anes PTL Lv  3 Current           2 Term 07/17/16 [redacted]w[redacted]Michelle For Breech 8'10oz M CS-LTranv   Liv  1 Term 05/12/14 [redacted]w[redacted]Michelle  9'6.5 oz M Vag-Spont   LIV    Gynecologic History:  Patient's last menstrual period was 02/07/2019.    Past Medical History: Patient  has a past medical history of Depression, DVT (deep venous thrombosis) (HCC), PE (pulmonary embolism), and Protein S deficiency (HCC).   Past Surgical History: She  has a past surgical  history that includes Ankle surgery (Left); Cesarean section (N/A, 07/17/2016); and Cholecystectomy (N/A, 04/23/2017).   Medications: Lovenox 110 mg every day [facility] Folic acid 2 mg every day Lexapro 20 mg every day B12  Vitamin D3 Unisom for sustained sleep Melatonin 5 mg at bedtime to fall asleep  Allergies: Patient has No Known Allergies.   Social History: Patient  reports that she has never smoked. She has never used smokeless tobacco. She reports previous alcohol use of about 1.0 standard drinks of alcohol per week. She reports that she does not use drugs. 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  Her husband is an Wellsite geologist.   Family History: family history includes Bladder Cancer in her maternal grandfather; Deep vein thrombosis in her brother; Depression in her mother; Diabetes in her maternal grandfather; Heart attack in her father; Heart disease in her father; Hyperlipidemia in her mother.   Review of Systems A full 12 point review of systems was negative or as noted in the History of Present Illness.  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 02/09/2019 and myself. A  new prescriptions [facility] [place].  In addition to being offered routine first trimester testing and second trimester anatomy US, I would recommend monthly USs for growth starting at [redacted] weeks gestation, weekly testing starting at 34-[redacted] weeks gestation and delivery at 39 weeks.    I would recommend she follow with Dr. Mike Gip for period monitoring of her anticoagulation.  The patient expects to see Dr. Mike Gip every 3 months.  I would recommend she stay on Lovenox for at least 2 weeks postpartum.  She could be converted to warfarin while  breastfeeding or stay on Lovenox.  Xarelto is contraindicated in lactation.     Total time spent with the patient was 40 minutes with greater than 50% spent in counseling and coordination of care.  We appreciate this interesting consult and will be happy to be involved in the ongoing care of Ms. Michelle Hogan in anyway her obstetricians desire.  Erasmo Score, MD [facility]

## 2019-04-25 NOTE — L&D Delivery Note (Signed)
Delivery Note  First Stage: Labor onset: 1400 Augmentation: none Analgesia /Anesthesia intrapartum: fentanyl, Epidural SROM at 1948 on 10/30/19  Second Stage: Complete dilation at 0052 Onset of pushing at 0100 FHR second stage Cat I tracing  Delivery of a viable female on 10/31/19 at 0116 by Stepfanie Yott CNM delivery of fetal head in ROA position with restitution to ROT. No nuchal cord;  Anterior then posterior shoulders delivered easily with gentle downward traction. Baby placed on mom's chest, and attended to by peds.  Cord double clamped after cessation of pulsation, cut by FOB Cord blood sample collected   Third Stage: Placenta delivered spontaneously intact with 3VC @ 0125 Placenta disposition: routine disposal Uterine tone Firm / bleeding scant  No laceration identified- intact perineum, vagina and cervix.  Anesthesia for repair: n/a Repair n/a Est. Blood Loss (mL): 150  Complications: none  Mom to postpartum.  Baby to Couplet care / Skin to Skin.  Newborn: Birth Weight: 8#1  Apgar Scores: 8/8 Feeding planned: breast

## 2019-04-29 LAB — OB RESULTS CONSOLE HEPATITIS B SURFACE ANTIGEN: Hepatitis B Surface Ag: NEGATIVE

## 2019-04-29 LAB — OB RESULTS CONSOLE VARICELLA ZOSTER ANTIBODY, IGG: Varicella: IMMUNE

## 2019-04-29 LAB — OB RESULTS CONSOLE RUBELLA ANTIBODY, IGM: Rubella: IMMUNE

## 2019-10-22 LAB — OB RESULTS CONSOLE RPR: RPR: NONREACTIVE

## 2019-10-22 LAB — OB RESULTS CONSOLE GC/CHLAMYDIA
Chlamydia: NEGATIVE
Gonorrhea: NEGATIVE

## 2019-10-22 LAB — OB RESULTS CONSOLE GBS: GBS: NEGATIVE

## 2019-10-22 LAB — OB RESULTS CONSOLE HIV ANTIBODY (ROUTINE TESTING): HIV: NONREACTIVE

## 2019-10-30 ENCOUNTER — Inpatient Hospital Stay: Payer: Managed Care, Other (non HMO) | Admitting: Anesthesiology

## 2019-10-30 ENCOUNTER — Inpatient Hospital Stay
Admission: EM | Admit: 2019-10-30 | Discharge: 2019-11-01 | DRG: 806 | Disposition: A | Discharge status: Home / Self Care | Payer: Managed Care, Other (non HMO) | Attending: Obstetrics and Gynecology | Admitting: Obstetrics and Gynecology

## 2019-10-30 ENCOUNTER — Encounter: Payer: Self-pay | Admitting: Obstetrics and Gynecology

## 2019-10-30 ENCOUNTER — Other Ambulatory Visit: Payer: Self-pay

## 2019-10-30 DIAGNOSIS — D6859 Other primary thrombophilia: Secondary | ICD-10-CM | POA: Diagnosis present

## 2019-10-30 DIAGNOSIS — Z20822 Contact with and (suspected) exposure to covid-19: Secondary | ICD-10-CM | POA: Diagnosis present

## 2019-10-30 DIAGNOSIS — Z3A37 37 weeks gestation of pregnancy: Secondary | ICD-10-CM

## 2019-10-30 DIAGNOSIS — O403XX0 Polyhydramnios, third trimester, not applicable or unspecified: Secondary | ICD-10-CM | POA: Diagnosis present

## 2019-10-30 DIAGNOSIS — Z7901 Long term (current) use of anticoagulants: Secondary | ICD-10-CM

## 2019-10-30 DIAGNOSIS — O9912 Other diseases of the blood and blood-forming organs and certain disorders involving the immune mechanism complicating childbirth: Secondary | ICD-10-CM | POA: Diagnosis present

## 2019-10-30 DIAGNOSIS — Z86711 Personal history of pulmonary embolism: Secondary | ICD-10-CM

## 2019-10-30 DIAGNOSIS — Z6791 Unspecified blood type, Rh negative: Secondary | ICD-10-CM | POA: Diagnosis not present

## 2019-10-30 DIAGNOSIS — Z79899 Other long term (current) drug therapy: Secondary | ICD-10-CM

## 2019-10-30 DIAGNOSIS — O34211 Maternal care for low transverse scar from previous cesarean delivery: Secondary | ICD-10-CM | POA: Diagnosis present

## 2019-10-30 DIAGNOSIS — O34219 Maternal care for unspecified type scar from previous cesarean delivery: Secondary | ICD-10-CM | POA: Diagnosis present

## 2019-10-30 DIAGNOSIS — O99344 Other mental disorders complicating childbirth: Secondary | ICD-10-CM | POA: Diagnosis present

## 2019-10-30 DIAGNOSIS — O26893 Other specified pregnancy related conditions, third trimester: Secondary | ICD-10-CM | POA: Diagnosis present

## 2019-10-30 DIAGNOSIS — Z86718 Personal history of other venous thrombosis and embolism: Secondary | ICD-10-CM

## 2019-10-30 DIAGNOSIS — F329 Major depressive disorder, single episode, unspecified: Secondary | ICD-10-CM | POA: Diagnosis present

## 2019-10-30 LAB — CBC
HCT: 34.3 % — ABNORMAL LOW (ref 36.0–46.0)
Hemoglobin: 12.7 g/dL (ref 12.0–15.0)
MCH: 31.4 pg (ref 26.0–34.0)
MCHC: 37 g/dL — ABNORMAL HIGH (ref 30.0–36.0)
MCV: 84.9 fL (ref 80.0–100.0)
Platelets: 183 10*3/uL (ref 150–400)
RBC: 4.04 MIL/uL (ref 3.87–5.11)
RDW: 13.7 % (ref 11.5–15.5)
WBC: 10.8 10*3/uL — ABNORMAL HIGH (ref 4.0–10.5)
nRBC: 0 % (ref 0.0–0.2)

## 2019-10-30 LAB — SARS CORONAVIRUS 2 BY RT PCR (HOSPITAL ORDER, PERFORMED IN ~~LOC~~ HOSPITAL LAB): SARS Coronavirus 2: NEGATIVE

## 2019-10-30 MED ORDER — ACETAMINOPHEN 325 MG PO TABS: 650.0000 mg | ORAL_TABLET | ORAL | Status: DC | PRN

## 2019-10-30 MED ORDER — LACTATED RINGERS IV SOLN: 500.0000 mL | INTRAVENOUS | Status: DC | PRN

## 2019-10-30 MED ORDER — ONDANSETRON HCL 4 MG/2ML IJ SOLN
4.0000 mg | Freq: Four times a day (QID) | INTRAMUSCULAR | Status: DC | PRN
  Administered 2019-10-30: 4 mg via INTRAVENOUS
  Filled 2019-10-30: qty 2

## 2019-10-30 MED ORDER — FENTANYL 2.5 MCG/ML W/ROPIVACAINE 0.15% IN NS 100 ML EPIDURAL (ARMC)
EPIDURAL | Status: AC
  Filled 2019-10-30: qty 100

## 2019-10-30 MED ORDER — LIDOCAINE HCL (PF) 1 % IJ SOLN: 30.0000 mL | INTRAMUSCULAR | Status: DC | PRN

## 2019-10-30 MED ORDER — AMMONIA AROMATIC IN INHA
RESPIRATORY_TRACT | Status: AC
  Filled 2019-10-30: qty 10

## 2019-10-30 MED ORDER — LACTATED RINGERS IV SOLN: INTRAVENOUS | Status: DC

## 2019-10-30 MED ORDER — FENTANYL 2.5 MCG/ML W/ROPIVACAINE 0.15% IN NS 100 ML EPIDURAL (ARMC)
EPIDURAL | Status: DC | PRN
  Administered 2019-10-30: 12 mL/h via EPIDURAL

## 2019-10-30 MED ORDER — LIDOCAINE HCL (PF) 1 % IJ SOLN
INTRAMUSCULAR | Status: DC | PRN
  Administered 2019-10-30: 3 mL via SUBCUTANEOUS

## 2019-10-30 MED ORDER — SOD CITRATE-CITRIC ACID 500-334 MG/5ML PO SOLN: 30.0000 mL | ORAL | Status: DC | PRN

## 2019-10-30 MED ORDER — OXYTOCIN BOLUS FROM INFUSION
333.0000 mL | Freq: Once | INTRAVENOUS | Status: AC
  Administered 2019-10-31: 333 mL via INTRAVENOUS

## 2019-10-30 MED ORDER — MISOPROSTOL 200 MCG PO TABS
ORAL_TABLET | ORAL | Status: AC
  Filled 2019-10-30: qty 4

## 2019-10-30 MED ORDER — FENTANYL CITRATE (PF) 100 MCG/2ML IJ SOLN
50.0000 ug | INTRAMUSCULAR | Status: DC | PRN
  Administered 2019-10-30 (×2): 50 ug via INTRAVENOUS
  Filled 2019-10-30: qty 2

## 2019-10-30 MED ORDER — OXYTOCIN 10 UNIT/ML IJ SOLN
INTRAMUSCULAR | Status: AC
  Filled 2019-10-30: qty 2

## 2019-10-30 MED ORDER — OXYTOCIN-SODIUM CHLORIDE 30-0.9 UT/500ML-% IV SOLN
2.5000 [IU]/h | INTRAVENOUS | Status: DC
  Filled 2019-10-30: qty 1000

## 2019-10-30 MED ORDER — LIDOCAINE-EPINEPHRINE (PF) 1.5 %-1:200000 IJ SOLN
INTRAMUSCULAR | Status: DC | PRN
  Administered 2019-10-30: 3 mL via EPIDURAL

## 2019-10-30 MED ORDER — LIDOCAINE HCL (PF) 1 % IJ SOLN
INTRAMUSCULAR | Status: AC
  Filled 2019-10-30: qty 30

## 2019-10-30 NOTE — Anesthesia Procedure Notes (Addendum)
Epidural Patient location during procedure: OB Start time: 10/30/2019 11:03 PM End time: 10/30/2019 11:04 PM  Staffing Anesthesiologist: Naomie Dean, MD  Preanesthetic Checklist Completed: patient identified, IV checked, site marked, risks and benefits discussed, surgical consent, monitors and equipment checked, pre-op evaluation and timeout performed  Epidural Patient position: sitting Prep: ChloraPrep Patient monitoring: heart rate, continuous pulse ox and blood pressure Approach: midline Location: L3-L4 Injection technique: LOR saline  Needle:  Needle type: Tuohy  Needle gauge: 17 G Needle length: 9 cm and 9 Needle insertion depth: 7 cm Catheter type: closed end flexible Catheter size: 19 Gauge Catheter at skin depth: 13 cm Test dose: negative and 1.5% lidocaine with Epi 1:200 K  Assessment Sensory level: T10 Events: blood not aspirated, injection not painful, no injection resistance, no paresthesia and negative IV test  Additional Notes Pt's history reviewed and consent obtained as per OB consent Patient tolerated the insertion well without complications. Negative SATD, negative IVTD All VSS were obtained and monitored through OBIX and nursing protocols followed.Reason for block:procedure for pain

## 2019-10-30 NOTE — Anesthesia Preprocedure Evaluation (Signed)
Anesthesia Evaluation  Patient identified by MRN, date of birth, ID band Patient awake    Reviewed: Allergy & Precautions, H&P , NPO status , Patient's Chart, lab work & pertinent test results  History of Anesthesia Complications Negative for: history of anesthetic complications  Airway Mallampati: III  TM Distance: >3 FB Neck ROM: full    Dental  (+) Poor Dentition   Pulmonary neg pulmonary ROS, neg shortness of breath,    Pulmonary exam normal breath sounds clear to auscultation       Cardiovascular Exercise Tolerance: Good (-) hypertension(-) angina(-) Past MI negative cardio ROS Normal cardiovascular exam Rhythm:regular Rate:Normal     Neuro/Psych PSYCHIATRIC DISORDERS Depression    GI/Hepatic negative GI ROS, GERD  ,  Endo/Other    Renal/GU   negative genitourinary   Musculoskeletal   Abdominal   Peds  Hematology negative hematology ROS (+) Blood dyscrasia, anemia ,   Anesthesia Other Findings Past Medical History: No date: Depression No date: PE (pulmonary embolism)  Past Surgical History: No date: ANKLE SURGERY Left  BMI    Body Mass Index:  38.94 kg/m      Reproductive/Obstetrics (+) Pregnancy                             Anesthesia Physical  Anesthesia Plan  ASA: III  Anesthesia Plan: Epidural   Post-op Pain Management:    Induction:   PONV Risk Score and Plan:   Airway Management Planned:   Additional Equipment:   Intra-op Plan:   Post-operative Plan:   Informed Consent: I have reviewed the patients History and Physical, chart, labs and discussed the procedure including the risks, benefits and alternatives for the proposed anesthesia with the patient or authorized representative who has indicated his/her understanding and acceptance.     Dental Advisory Given  Plan Discussed with:   Anesthesia Plan Comments: (Patient reports last lovenox on  07/15/16 Plan for CSE)        Anesthesia Quick Evaluation

## 2019-10-30 NOTE — OB Triage Note (Signed)
Chief complaint is ctx for the last hour and they are consist and about 2 mins apart.

## 2019-10-30 NOTE — H&P (Addendum)
OB History & Physical   History of Present Illness:  Chief Complaint: painful UCs all afternoon  HPI:  Michelle Hogan is a 34 y.o. G102P2002 female at [redacted]w[redacted]d dated by LMP.  She presents to L&D for painful UCs since around 1400. Reports active FM, denies SROM or bloody show.  - Cervical change since office visit earlier today, was 2-3 in office, changed to 4cm at 1700 today. UCs q2-50min, palpate moderate.     Pregnancy Issues: 1. Prepregnancy BMI 40   CMP, P/C ratio, A1c WNL 2. Prior c-section  06/2016 LTCS for breech position  04/2014 SVD  Desires TOLAC, counseled by TJS 07/28/19 3. 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 History of postpartum depression:  Currently taking Lexapro 20mg  daily 5. Rh negative  RhoGAM given 08/29/19 6. Baseline proteinuria  P/C ratio at 11w: 0.223  24 hour protein: 379 grams  Repeat postpartum and if still elevated, send to nephrology 7. Uterine S>D-->Polyhydramnios   10/24/19 for growth ordered   57%ile @ 33wks  10/22/19 7lb8oz 3411g @76 % 10/30/19: AFI 27.8cm   Maternal Medical History:   Past Medical History:  Diagnosis Date  . Depression   . DVT (deep venous thrombosis) (HCC)   . PE (pulmonary embolism)   . Protein S deficiency [facility])     Past Surgical History:  Procedure Laterality Date  . ANKLE SURGERY Left   . CESAREAN SECTION N/A 07/17/2016   Procedure: C-Section;  Surgeon: 10/24/19 Ward, MD;  Location: [facility] ORS;  Service: Obstetrics;  Laterality: N/A;  . CHOLECYSTECTOMY N/A 04/23/2017   Procedure: LAPAROSCOPIC CHOLECYSTECTOMY;  Surgeon: 12/31/19, MD;  Location: [facility] ORS;   Service: General;  Laterality: N/A;    No Known Allergies  Prior to Admission medications   Medication Sig Start Date End Date Taking? Authorizing Provider  cholecalciferol (VITAMIN D3) 25 MCG (1000 UT) tablet Take 2,000 Units by mouth daily.     [provider]  docusate sodium (COLACE) 100 MG capsule Take 100 mg by mouth daily.    [provider]  doxylamine, Sleep, (UNISOM) 25 MG tablet Take 25 mg by mouth at bedtime as needed.    [provider]  enoxaparin (LOVENOX) 120 MG/0.8ML injection Inject 110 mg into the skin every 12 (twelve) hours. Current dose 04/14/19    [provider]  escitalopram (LEXAPRO) 20 MG tablet Take 1 tablet by mouth daily. 11/13/18   [provider]  folic acid (FOLVITE) 1 MG tablet Take 1 mg by mouth daily.    [provider]  omeprazole (PRILOSEC) 20 MG capsule Take 20 mg by mouth daily.    [provider]  Prenatal Vit-Fe Fumarate-FA (PRENATAL MULTIVITAMIN) TABS tablet Take 1 tablet by mouth daily at 12 noon.    [provider]  vitamin B-12 (CYANOCOBALAMIN) 1000 MCG tablet Take 2,000 mcg by mouth daily.     [provider]     Pren[facility] care site: [facility]    Social History: She  reports that she has never smoked. She has never used smokeless tobacco. She reports previous alcohol use of about 1.0 standard  drink of alcohol per week. She reports that she does not use drugs.  Family History: family history includes Bladder Cancer in her maternal grandfather; Deep vein thrombosis in her brother; Depression in her mother; Diabetes in her maternal grandfather; Heart attack in her father; Heart disease in her father; Hyperlipidemia in her mother.   Review of Systems: A full review of systems was performed and negative except as noted in the HPI.     Physical Exam:  Vital Signs: BP 115/84 (BP Location: Right Arm)   Pulse 70   Temp 98.4 F (36.9 C) (Oral)   Resp 12    Ht 5\' 5"  (1.651 m)   Wt 105.7 kg   LMP 02/07/2019   BMI 38.77 kg/m  General: no acute distress.  HEENT: normocephalic, atraumatic Heart: regular rate & rhythm.  No murmurs/rubs/gallops Lungs: clear to auscultation bilaterally, normal respiratory effort Abdomen: soft, gravid, non-tender;  EFW: 9lbs Pelvic:   External: Normal external female genitalia  Cervix: Dilation: 4 / Effacement (%): 50 / Station: -2    Extremities: non-tender, symmetric, no edema bilaterally.  DTRs: 2+  Neurologic: Alert & oriented x 3.    Results for orders placed or performed during the hospital encounter of 10/30/19 (from the past 24 hour(s))  SARS Coronavirus 2 by RT PCR (hospital order, performed in [facility] hospital lab) Nasopharyngeal Nasopharyngeal Swab     Status: None   Collection Time: 10/30/19  5:46 PM   Specimen: Nasopharyngeal Swab  Result Value Ref Range   SARS Coronavirus 2 NEGATIVE NEGATIVE  CBC     Status: Abnormal   Collection Time: 10/30/19  5:46 PM  Result Value Ref Range   WBC 10.8 (H) 4.0 - 10.5 K/uL   RBC 4.04 3.87 - 5.11 MIL/uL   Hemoglobin 12.7 12.0 - 15.0 g/dL   HCT 12/31/19 (L) 36 - 46 %   MCV 84.9 80.0 - 100.0 fL   MCH 31.4 26.0 - 34.0 pg   MCHC 37.0 (H) 30.0 - 36.0 g/dL   RDW 60.6 30.1 - 60.1 %   Platelets 183 150 - 400 K/uL   nRBC 0.0 0.0 - 0.2 %    Pertinent Results:  Prenatal Labs: Blood type/Rh  A negative  Antibody screen Neg, rhogam 08/29/19  Rubella Immune  Varicella Immune  RPR NR  HBsAg Neg  HIV NR  GC neg  Chlamydia neg  Genetic screening negative  1 hour GTT  117  GBS  neg   FHT: 125bpm, mod variability, + accels, no decels TOCO: q1-34min SVE:  Dilation: 4 / Effacement (%): 50 / Station: -2    Cephalic by leopolds and SVE  No results found.  Assessment:  Michelle Hogan is a 34 y.o. G27P2002 female at [redacted]w[redacted]d with spontaneous labor.   Plan:  1. Admit to Labor & Delivery; consents reviewed and obtained - COVID swab pending - prior hx PE and DVT;  on Lovenox 110mg  [place] BID, last dose 7/7 at 2300; will restart Lovenox at 12hrs PP. SCDs and TED hose now.  - Prior CS for breech, 2018; counseled by MD in office.   2. Fetal Well being  - Fetal Tracing: Cat I tracing - Group B Streptococcus ppx indicated: negative - Presentation: cephalic confirmed by exam   3. Routine OB: - Prenatal labs reviewed, as above - Rh A negative - CBC, T&S, RPR on admit - Clear fluids, IVF  4. Monitoring of Labor  -  Contractions: external toco in place -  Pelvis proven to 9#6  -  Plan for augmentation with AROM prn -  Plan for continuous fetal monitoring  -  Maternal pain control as desired - Anticipate vaginal delivery  5. Post Partum Planning: - Infant feeding: breast - Contraception: TBD - tdap 08/29/19  Randa Ngo, CNM 10/30/19 7:37 PM  Addendum:   Cervix re-examined at 1948, 5/75/-1 with BBOW, SROM during exam.   Randa Ngo, CNM

## 2019-10-31 ENCOUNTER — Encounter: Payer: Self-pay | Admitting: Obstetrics and Gynecology

## 2019-10-31 LAB — TYPE AND SCREEN
ABO/RH(D): A NEG
Antibody Screen: POSITIVE

## 2019-10-31 LAB — RPR: RPR Ser Ql: NONREACTIVE

## 2019-10-31 MED ORDER — COCONUT OIL OIL
1.0000 "application " | TOPICAL_OIL | Status: DC | PRN
  Administered 2019-10-31: 1 via TOPICAL
  Filled 2019-10-31: qty 120

## 2019-10-31 MED ORDER — SENNOSIDES-DOCUSATE SODIUM 8.6-50 MG PO TABS
2.0000 | ORAL_TABLET | ORAL | Status: DC
  Administered 2019-11-01: 2 via ORAL
  Filled 2019-10-31: qty 2

## 2019-10-31 MED ORDER — DIPHENHYDRAMINE HCL 50 MG/ML IJ SOLN: 12.5000 mg | INTRAMUSCULAR | Status: DC | PRN

## 2019-10-31 MED ORDER — DIPHENHYDRAMINE HCL 25 MG PO CAPS: 25.0000 mg | ORAL_CAPSULE | Freq: Four times a day (QID) | ORAL | Status: DC | PRN

## 2019-10-31 MED ORDER — EPHEDRINE 5 MG/ML INJ
10.0000 mg | INTRAVENOUS | Status: DC | PRN
  Filled 2019-10-31: qty 2

## 2019-10-31 MED ORDER — LACTATED RINGERS IV SOLN: 500.0000 mL | Freq: Once | INTRAVENOUS | Status: DC

## 2019-10-31 MED ORDER — DIBUCAINE (PERIANAL) 1 % EX OINT: 1.0000 "application " | TOPICAL_OINTMENT | CUTANEOUS | Status: DC | PRN

## 2019-10-31 MED ORDER — ONDANSETRON HCL 4 MG/2ML IJ SOLN: 4.0000 mg | INTRAMUSCULAR | Status: DC | PRN

## 2019-10-31 MED ORDER — BENZOCAINE-MENTHOL 20-0.5 % EX AERO: 1.0000 "application " | INHALATION_SPRAY | CUTANEOUS | Status: DC | PRN

## 2019-10-31 MED ORDER — ONDANSETRON HCL 4 MG PO TABS: 4.0000 mg | ORAL_TABLET | ORAL | Status: DC | PRN

## 2019-10-31 MED ORDER — ACETAMINOPHEN 325 MG PO TABS
650.0000 mg | ORAL_TABLET | ORAL | Status: DC | PRN
  Administered 2019-10-31 – 2019-11-01 (×4): 650 mg via ORAL
  Filled 2019-10-31 (×4): qty 2

## 2019-10-31 MED ORDER — PHENYLEPHRINE 40 MCG/ML (10ML) SYRINGE FOR IV PUSH (FOR BLOOD PRESSURE SUPPORT)
80.0000 ug | PREFILLED_SYRINGE | INTRAVENOUS | Status: DC | PRN
  Filled 2019-10-31: qty 10

## 2019-10-31 MED ORDER — ESCITALOPRAM OXALATE 10 MG PO TABS
20.0000 mg | ORAL_TABLET | Freq: Every day | ORAL | Status: DC
  Administered 2019-10-31 – 2019-11-01 (×2): 20 mg via ORAL
  Filled 2019-10-31 (×2): qty 2

## 2019-10-31 MED ORDER — WITCH HAZEL-GLYCERIN EX PADS: 1.0000 "application " | MEDICATED_PAD | CUTANEOUS | Status: DC | PRN

## 2019-10-31 MED ORDER — SIMETHICONE 80 MG PO CHEW: 80.0000 mg | CHEWABLE_TABLET | ORAL | Status: DC | PRN

## 2019-10-31 MED ORDER — PRENATAL MULTIVITAMIN CH
1.0000 | ORAL_TABLET | Freq: Every day | ORAL | Status: DC
  Administered 2019-10-31: 1 via ORAL
  Filled 2019-10-31 (×2): qty 1

## 2019-10-31 MED ORDER — IBUPROFEN 600 MG PO TABS
600.0000 mg | ORAL_TABLET | Freq: Four times a day (QID) | ORAL | Status: DC
  Administered 2019-10-31 – 2019-11-01 (×5): 600 mg via ORAL
  Filled 2019-10-31 (×5): qty 1

## 2019-10-31 MED ORDER — FENTANYL 2.5 MCG/ML W/ROPIVACAINE 0.15% IN NS 100 ML EPIDURAL (ARMC): 12.0000 mL/h | EPIDURAL | Status: DC

## 2019-10-31 MED ORDER — ZOLPIDEM TARTRATE 5 MG PO TABS: 5.0000 mg | ORAL_TABLET | Freq: Every evening | ORAL | Status: DC | PRN

## 2019-10-31 MED ORDER — ENOXAPARIN SODIUM 120 MG/0.8ML ~~LOC~~ SOLN
110.0000 mg | Freq: Two times a day (BID) | SUBCUTANEOUS | Status: DC
  Administered 2019-10-31 – 2019-11-01 (×2): 110 mg via SUBCUTANEOUS
  Filled 2019-10-31 (×3): qty 0.8

## 2019-10-31 NOTE — Progress Notes (Signed)
Post Partum Day 0  Subjective: no complaints, up ad lib, voiding and tolerating PO  Doing well, no concerns. Ambulating without difficulty, pain managed with PO meds, tolerating regular diet, and voiding without difficulty.   No fever/chills, chest pain, shortness of breath, nausea/vomiting, or leg pain. No nipple or breast pain. No headache, visual changes, or RUQ/epigastric pain.  Objective: BP 117/70 (BP Location: Right Arm)   Pulse 69   Temp 98.4 F (36.9 C) (Oral)   Resp 18   Ht 5\' 5"  (1.651 m)   Wt 105.7 kg   LMP 02/07/2019   SpO2 99% Comment: Room Air  Breastfeeding Unknown   BMI 38.77 kg/m    Physical Exam:  General: alert, cooperative and no distress Breasts: soft/nontender CV: RRR Pulm: nl effort, CTABL Abdomen: soft, non-tender, active bowel sounds Uterine Fundus: firm Perineum: minimal edema, intact Lochia: appropriate DVT Evaluation: No evidence of DVT seen on physical exam.  Recent Labs    10/30/19 1746  HGB 12.7  HCT 34.3*  WBC 10.8*  PLT 183    Assessment/Plan: 34 y.o. G3P3003 postpartum day # 0  -Continue routine postpartum care -Lactation consult PRN for breastfeeding -Discussed contraceptive options including implant, IUDs hormonal and non-hormonal, injection, pills/ring/patch, condoms, and NFP. - Planning vasectomy -CBC in AM -Immunization status:  all immunizations up to date  Disposition: Continue inpatient postpartum care, desires discharge tomorrow.   LOS: 1 day   20, Gustavo Lah 10/31/2019, 6:42 PM   ----- 01/01/2020  Certified Nurse Midwife [facility]

## 2019-10-31 NOTE — Discharge Summary (Signed)
Obstetrical Discharge Summary  Patient Name: Michelle Hogan DOB: 1986/03/23 MRN: [id number]  Date of Admission: 10/30/2019 Date of Delivery: 10/31/19 Delivered by: Heloise Ochoa CNM Date of Discharge: 11/01/2019  Primary OB: [facility] FUX:NATFTDD'U last menstrual period was 02/07/2019. EDC Estimated Date of Delivery: 11/14/19 Gestational Age at Delivery: [redacted]w[redacted]d   Antepartum complications:  1. Prepregnancy BMI 40   CMP, P/C ratio, A1c WNL 2. Prior c-section  06/2016 LTCS for breech position  04/2014 SVD  Desires TOLAC, counseled by TJS 07/28/19 3. 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 History of postpartum depression:  Currently taking Lexapro 20mg  daily 5. Rh negative  RhoGAM given 08/29/19 6. Baseline proteinuria  P/C ratio at 11w: 0.223  24 hour protein: 379 grams  Repeat postpartum and if still elevated, send to nephrology 7. Uterine S>D-->Polyhydramnios   10/24/19 for growth ordered   57%ile @ 33wks  10/22/19 7lb8oz 3411g @76 % 10/30/19: AFI 27.8cm  Admitting Diagnosis: Labor, previous CS delivery Secondary Diagnosis: VBAC  Patient Active Problem List   Diagnosis Date Noted  . Previous cesarean delivery, antepartum 10/30/2019  . Protein S deficiency (HCC) 12/18/2018  . Chronic anticoagulation 12/18/2018  . Acute pulmonary embolism (HCC) 04/19/2017  . DVT (deep venous thrombosis) (HCC) 04/19/2017  . Labor and delivery, indication for care 07/17/2016  . Deficiency anemia 05/17/2016  . Primary hypercoagulable state (HCC) 01/04/2016  . High risk pregnancy, antepartum 12/20/2015  . Major depressive disorder,  single episode, moderate (HCC) 08/18/2015  . Hyperlipidemia 08/12/2015  . FH: heart disease 08/12/2015  . Postpartum depression 08/12/2015  . Vitamin D deficiency 07/14/2015  . Obesity, Class II, BMI 35-39.9 07/12/2015  . Biliary dyskinesia 07/12/2015  . Hx pulmonary embolism 01/27/2015    Augmentation: N/A Complications: None Intrapartum complications/course: see delivery note Date of Delivery: 10/31/19 Delivered by: 07/16/2015 CNM Delivery Type: vaginal birth after cesarean (VBAC) Anesthesia: epidural Placenta: spontaneous Laceration: none Episiotomy: none Newborn Data: Live born female "07[birth year]14/2016" Birth Weight: 8 lb 1.1 oz (3660 g) APGAR: 8, 8  Newborn Delivery   Birth date/time: 10/31/2019 01:16:00 Delivery type: Vaginal, Spontaneous     Postpartum Procedures:  None  Edinburgh:  Edinburgh Postnatal Depression Scale Screening Tool 10/31/2019 10/31/2019  I have been able to laugh and see the funny side of things. 0 (No Data)  I have looked forward with enjoyment to things. 0 -  I have blamed myself unnecessarily when things went wrong. 0 -  I have been anxious or worried for no good reason. 0 -  I have felt scared or panicky for no good reason. 0 -  Things have been getting on top of me. 0 -  I have been so unhappy that I have had difficulty sleeping. 0 -  I have felt sad or miserable. 0 -  I have been so unhappy that I have been crying. 0 -  The thought of harming myself has occurred to me. 0 -  Edinburgh Postnatal Depression Scale Total 0 -      Post partum course:  Patient had an uncomplicated postpartum course.  By time of discharge on PPD#1, her pain was  controlled on oral pain medications; she had appropriate lochia and was ambulating, voiding without difficulty and tolerating regular diet.  She will continue Lovenox for at least 6 weeks postpartum.  Further management of thromboprophylaxis per hematology. She was deemed stable for discharge to home.     Discharge  Physical Exam:  BP 102/63 (BP Location: Right Arm)   Pulse 62   Temp 98.5 F (36.9 C) (Oral)   Resp 20   Ht 5\' 5"  (1.651 m)   Wt 105.7 kg   LMP 02/07/2019   SpO2 99%   Breastfeeding Unknown   BMI 38.77 kg/m   General: NAD CV: RRR Pulm: CTABL, nl effort ABD: s/nd/nt, fundus firm and below the umbilicus Lochia: moderate Perineum: well approximated/intact DVT Evaluation: LE non-ttp, no evidence of DVT on exam.  Hemoglobin  Date Value Ref Range Status  11/01/2019 11.0 (L) 12.0 - 15.0 g/dL Final  01/02/2020 62/69/4854 11.1 - 15.9 g/dL Final   HCT  Date Value Ref Range Status  11/01/2019 31.4 (L) 36 - 46 % Final   Hematocrit  Date Value Ref Range Status  07/13/2015 40.8 34.0 - 46.6 % Final     Disposition: stable, discharge to home. Baby Feeding: breastmilk Baby Disposition: home with mom  Rh Immune globulin given:  Rubella vaccine given: immune Varicella vaccine given: immune Tdap vaccine given in AP or PP setting: 08/29/19 Flu vaccine given in AP or PP setting: declined  Contraception: Considering BTL versus vasectomy   Prenatal Labs:  Blood type/Rh  A negative  Antibody screen Neg, rhogam 08/29/19  Rubella Immune  Varicella Immune  RPR NR  HBsAg Neg  HIV NR  GC neg  Chlamydia neg  Genetic screening negative  1 hour GTT  117  GBS  neg      Plan:  Michelle Hogan was discharged to home in good condition. Follow-up appointment with delivering provider in 6 weeks. Follow-up with hematology for further management plans of Lovenox.    Discharge Medications: Allergies as of 11/01/2019   No Known Allergies     Medication List    STOP taking these medications   doxylamine (Sleep) 25 MG tablet Commonly known as: UNISOM     TAKE these medications   acetaminophen 325 MG tablet Commonly known as: Tylenol Take 2 tablets (650 mg total) by mouth every 4 (four) hours as needed (for pain scale < 4).   cholecalciferol 25 MCG (1000 UNIT) tablet Commonly known  as: VITAMIN D3 Take 2,000 Units by mouth daily.   docusate sodium 100 MG capsule Commonly known as: COLACE Take 100 mg by mouth daily.   enoxaparin 120 MG/0.8ML injection Commonly known as: LOVENOX Inject 110 mg into the skin every 12 (twelve) hours. Current dose 04/14/19   escitalopram 20 MG tablet Commonly known as: LEXAPRO Take 1 tablet by mouth daily.   folic acid 1 MG tablet Commonly known as: FOLVITE Take 1 mg by mouth daily.   ibuprofen 600 MG tablet Commonly known as: ADVIL Take 1 tablet (600 mg total) by mouth every 6 (six) hours as needed for mild pain or moderate pain.   omeprazole 20 MG capsule Commonly known as: PRILOSEC Take 20 mg by mouth daily.   prenatal multivitamin Tabs tablet Take 1 tablet by mouth daily at 12 noon.   vitamin B-12 1000 MCG tablet Commonly known as: CYANOCOBALAMIN Take 2,000 mcg by mouth daily.        Follow-up Information    McVey, 04/16/19, CNM. Schedule  an appointment as soon as possible for a visit in 6 week(s).   Specialty: Obstetrics and Gynecology Why: pospartum visit  Contact information: [address] [place] [address] [phone]               Signed:  Al Corpus 11/01/2019  10:13 AM  Margaretmary Eddy, CNM Certified Nurse Midwife [facility]

## 2019-11-01 LAB — CBC
HCT: 31.4 % — ABNORMAL LOW (ref 36.0–46.0)
Hemoglobin: 11 g/dL — ABNORMAL LOW (ref 12.0–15.0)
MCH: 30.9 pg (ref 26.0–34.0)
MCHC: 35 g/dL (ref 30.0–36.0)
MCV: 88.2 fL (ref 80.0–100.0)
Platelets: 168 10*3/uL (ref 150–400)
RBC: 3.56 MIL/uL — ABNORMAL LOW (ref 3.87–5.11)
RDW: 14.3 % (ref 11.5–15.5)
WBC: 8 10*3/uL (ref 4.0–10.5)
nRBC: 0 % (ref 0.0–0.2)

## 2019-11-01 LAB — CREATININE, SERUM
Creatinine, Ser: 0.51 mg/dL (ref 0.44–1.00)
GFR calc Af Amer: >60 mL/min (ref >60)
GFR calc non Af Amer: >60 mL/min (ref >60)

## 2019-11-01 MED ORDER — IBUPROFEN 600 MG PO TABS: 600.0000 mg | ORAL_TABLET | Freq: Four times a day (QID) | ORAL | 3 refills | Status: DC | PRN

## 2019-11-01 MED ORDER — ACETAMINOPHEN 325 MG PO TABS: 650.0000 mg | ORAL_TABLET | ORAL | Status: DC | PRN

## 2019-11-01 NOTE — Discharge Instructions (Signed)
Discharge Instructions:   Follow-up Appointment: Call ASAP and schedule a follow-up appointment for a visit in 6 weeks with Heloise Ochoa, CNM at [facility]!   If there are any new medications, they have been ordered and will be available for pickup at the listed pharmacy on your way home from the hospital.   Call office if you have any of the following: headache, visual changes, fever >101.0 F, chills, shortness of breath, breast concerns, excessive vaginal bleeding, incision drainage or problems, leg pain or redness, depression or any other concerns. If you have vaginal discharge with an odor, let your doctor know.   It is normal to bleed for up to 6 weeks. You should not soak through more than 1 pad in 1 hour. If you have a blood clot larger than your fist with continued bleeding, call your doctor.   Activity: Do not lift > 10 lbs for 6 weeks (do not lift anything heavier than your baby). No intercourse, tampons, swimming pools, hot tubs, baths (only showers) for 6 weeks.  No driving for 1-2 weeks. Continue prenatal vitamin, especially if breastfeeding. Increase calories and fluids (water) while breastfeeding.   Your milk will come in, in the next couple of days (right now it is colostrum). You may have a slight fever when your milk comes in, but it should go away on its own.  If it does not, and rises above 101 F please call the doctor. You will also feel achy and your breasts will be firm. They will also start to leak. If you are breastfeeding, continue as you have been and you can pump/express milk for comfort.   If you have too much milk, your breasts can become engorged, which could lead to mastitis. This is an infection of the milk ducts. It can be very painful and you will need to notify your doctor to obtain a prescription for antibiotics. You can also treat it with a shower or hot/cold compress.   For concerns about your baby, please call your pediatrician.  For breastfeeding  concerns, the lactation consultant can be reached at [phone].   Postpartum blues (feelings of happy one minute and sad another minute) are normal for the first few weeks but if it gets worse let your doctor know.   Congratulations! We enjoyed caring for you and your new bundle of joy!

## 2019-11-01 NOTE — Progress Notes (Signed)
Patient discharged home with infant. Discharge instructions and prescriptions given and reviewed with patient. Patient verbalized understanding.   Encouraged pt to call soon and schedule 6-week follow-up appointment with Heloise Ochoa, CNM!   Escorted out by staff.

## 2019-11-06 NOTE — Anesthesia Postprocedure Evaluation (Addendum)
Anesthesia Post Note  Patient: Michelle Hogan  Procedure(s) Performed: AN AD HOC LABOR EPIDURAL  Patient location during evaluation: Mother Baby Anesthesia Type: Epidural Comments: Patient was discharged from the hospital prior to being seen by anesthesia.  She had no complaints of anesthetic complications from her epidural, was ambulating well, and stable prior to discharge.   No complications documented.   Last Vitals:  Vitals:   10/31/19 2252 11/01/19 0756  BP: 115/63 102/63  Pulse: 63 62  Resp: 18 20  Temp: 36.5 C 36.9 C  SpO2: 100% 99%    Last Pain:  Vitals:   11/01/19 0825  TempSrc:   PainSc: 0-No pain                 Lenard Simmer

## 2020-11-02 ENCOUNTER — Inpatient Hospital Stay: Payer: 59 | Attending: Internal Medicine | Admitting: Internal Medicine

## 2020-11-02 ENCOUNTER — Inpatient Hospital Stay: Payer: 59

## 2020-11-02 ENCOUNTER — Other Ambulatory Visit: Payer: Self-pay

## 2020-11-02 ENCOUNTER — Encounter: Payer: Self-pay | Admitting: Internal Medicine

## 2020-11-02 DIAGNOSIS — Z86711 Personal history of pulmonary embolism: Secondary | ICD-10-CM | POA: Insufficient documentation

## 2020-11-02 DIAGNOSIS — Z86718 Personal history of other venous thrombosis and embolism: Secondary | ICD-10-CM | POA: Diagnosis present

## 2020-11-02 DIAGNOSIS — Z8349 Family history of other endocrine, nutritional and metabolic diseases: Secondary | ICD-10-CM | POA: Diagnosis not present

## 2020-11-02 DIAGNOSIS — Z7901 Long term (current) use of anticoagulants: Secondary | ICD-10-CM | POA: Diagnosis not present

## 2020-11-02 DIAGNOSIS — Z8249 Family history of ischemic heart disease and other diseases of the circulatory system: Secondary | ICD-10-CM | POA: Insufficient documentation

## 2020-11-02 DIAGNOSIS — Z79899 Other long term (current) drug therapy: Secondary | ICD-10-CM | POA: Diagnosis not present

## 2020-11-02 DIAGNOSIS — Z8052 Family history of malignant neoplasm of bladder: Secondary | ICD-10-CM | POA: Insufficient documentation

## 2020-11-02 DIAGNOSIS — D6859 Other primary thrombophilia: Secondary | ICD-10-CM | POA: Insufficient documentation

## 2020-11-02 LAB — COMPREHENSIVE METABOLIC PANEL
ALT: 26 U/L (ref 0–44)
AST: 23 U/L (ref 15–41)
Albumin: 4.3 g/dL (ref 3.5–5.0)
Alkaline Phosphatase: 85 U/L (ref 38–126)
Anion gap: 8 (ref 5–15)
BUN: 15 mg/dL (ref 6–20)
CO2: 23 mmol/L (ref 22–32)
Calcium: 9.3 mg/dL (ref 8.9–10.3)
Chloride: 104 mmol/L (ref 98–111)
Creatinine, Ser: 0.68 mg/dL (ref 0.44–1.00)
GFR, Estimated: >60 mL/min (ref >60)
Glucose, Bld: 106 mg/dL — ABNORMAL HIGH (ref 70–99)
Potassium: 3.4 mmol/L — ABNORMAL LOW (ref 3.5–5.1)
Sodium: 135 mmol/L (ref 135–145)
Total Bilirubin: 0.5 mg/dL (ref 0.3–1.2)
Total Protein: 7.6 g/dL (ref 6.5–8.1)

## 2020-11-02 LAB — CBC WITH DIFFERENTIAL/PLATELET
Abs Immature Granulocytes: 0.02 10*3/uL (ref 0.00–0.07)
Basophils Absolute: 0 10*3/uL (ref 0.0–0.1)
Basophils Relative: 0 %
Eosinophils Absolute: 0.1 10*3/uL (ref 0.0–0.5)
Eosinophils Relative: 2 %
HCT: 36.8 % (ref 36.0–46.0)
Hemoglobin: 12.9 g/dL (ref 12.0–15.0)
Immature Granulocytes: 0 %
Lymphocytes Relative: 41 %
Lymphs Abs: 2.6 10*3/uL (ref 0.7–4.0)
MCH: 29.3 pg (ref 26.0–34.0)
MCHC: 35.1 g/dL (ref 30.0–36.0)
MCV: 83.6 fL (ref 80.0–100.0)
Monocytes Absolute: 0.4 10*3/uL (ref 0.1–1.0)
Monocytes Relative: 6 %
Neutro Abs: 3.2 10*3/uL (ref 1.7–7.7)
Neutrophils Relative %: 51 %
Platelets: 270 10*3/uL (ref 150–400)
RBC: 4.4 MIL/uL (ref 3.87–5.11)
RDW: 12.6 % (ref 11.5–15.5)
WBC: 6.3 10*3/uL (ref 4.0–10.5)
nRBC: 0 % (ref 0.0–0.2)

## 2020-11-02 MED ORDER — RIVAROXABAN 20 MG PO TABS
20.0000 mg | ORAL_TABLET | Freq: Every day | ORAL | 2 refills | Status: DC
Start: 2020-11-02 — End: 2021-05-10

## 2020-11-02 NOTE — Assessment & Plan Note (Addendum)
#   Prior history of PE [provoked-long car ride/BCPs]; and also LE [below knee DVT- during post partum]; with a history of protein S deficiency.  Patient currently on indefinite anticoagulation.  #Since patient is postpartum/finished breast-feeding-I think is reasonable to switch over from Lovenox to Xarelto.  New prescription started.  Again discussed the precautions.  # DISPOSITION: # today labs- cbc/cmp # follow up in 6 months- MD: labs cbc/bmp-Dr.B

## 2020-11-02 NOTE — Progress Notes (Signed)
[facility] CONSULT [facility]  Patient Care Team: Duanne Limerick, MD as PCP - General (Family Medicine)  CHIEF COMPLAINTS/PURPOSE OF CONSULTATION:   # June 2018- LE DVT [below knee; appx 10 weeks post partum]- on Lovenox; stop Sep 19th 2018; OCT 2018- PROTEIN S/ Activity- LOW;OFF ANti-COAG- D-DIMER- 1000; RECOMMENDED INDEFINITE ANTI-COAG  NOV 11th 2018- Re-START Lovenox 100 mg SQ/day; March 13th 2019- Xarelto 20mg /day; AUG 20th 2019- STOP xarelto; START LOVENOX 150mg  SQ q day; s/p PREGNANCY/delivery- July 2022-start Xarelto.  # 2013 SEP- Pulmonary embolus [BCPs/long car trip]- xarelto x 6 M; Brother Protein S deficiency.   Oncology History   No history exists.     HISTORY OF PRESENTING ILLNESS:  Michelle Hogan 35 y.o.  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  In interim patient underwent delivery of the baby/currently postpartum.  She stopped breast-feeding.  She is interested in going back on Xarelto.  Patient denies any blood in stools or black or stools.   Review of Systems  Constitutional:  Negative for chills, diaphoresis, fever, malaise/fatigue and weight loss.  HENT:  Negative for nosebleeds and sore throat.   Eyes:  Negative for double vision.  Respiratory:  Negative for cough, hemoptysis, sputum production, shortness of breath and wheezing.   Cardiovascular:  Negative for chest pain, palpitations, orthopnea and leg swelling.  Gastrointestinal:  Negative for abdominal pain, blood in stool, constipation, diarrhea, heartburn, melena, nausea and vomiting.  Genitourinary:  Negative for dysuria, frequency and urgency.  Musculoskeletal:  Negative for back pain and joint pain.  Skin: Negative.  Negative for itching and rash.  Neurological:  Negative for dizziness, tingling, focal weakness, weakness and headaches.  Endo/Heme/Allergies:  Does not bruise/bleed  easily.  Psychiatric/Behavioral:  Negative for depression. The patient is not nervous/anxious and does not have insomnia.     MEDICAL HISTORY:  Past Medical History:  Diagnosis Date   Depression    DVT (deep venous thrombosis) (HCC)    PE (pulmonary embolism)    Protein S deficiency (HCC)     SURGICAL HISTORY: Past Surgical History:  Procedure Laterality Date   ANKLE SURGERY Left    CESAREAN SECTION N/A 07/17/2016   Procedure: C-Section;  Surgeon: 31 Ward, MD;  Location: [facility] ORS;  Service: Obstetrics;  Laterality: N/A;   CHOLECYSTECTOMY N/A 04/23/2017   Procedure: LAPAROSCOPIC CHOLECYSTECTOMY;  Surgeon: Elenora Fender, MD;  Location: [facility] ORS;  Service: General;  Laterality: N/A;    SOCIAL HISTORY: no smoking; stay home mom; in [place]. Rare alcohol Social History   Socioeconomic History   Marital status: Married    Spouse name: [name]   Number of children: Not on file   Years of education: Not on file   Highest education level: Not on file  Occupational History   Not on file  Tobacco Use   Smoking status: Never   Smokeless tobacco: Never  Vaping Use   Vaping Use: Never used  Substance and Sexual Activity   Alcohol use: Not Currently    Alcohol/week: 1.0 standard drink    Types: 1 Cans of beer per week    Comment: rarely   Drug use: No   Sexual activity: Yes    Birth control/protection: I.U.D.  Other Topics Concern   Not on file  Social History Narrative   Not on file   Social Determinants of Health   Financial Resource Strain: Not on file  Food Insecurity: Not on file  Transportation Needs: Not on file  Physical Activity: Not on file  Stress: Not on file  Social Connections: Not on file  Intimate Partner Violence: Not on file    FAMILY HISTORY: mom's cousin Sep 05, 2022 died on blood clots]; brother blood clot; mom- no blood clots. Family History  Problem Relation Age of Onset   Hyperlipidemia Mother    Depression Mother    Heart attack Father     Heart disease Father    Deep vein thrombosis Brother    Diabetes Maternal Grandfather    Bladder Cancer Maternal Grandfather     ALLERGIES:  has No Known Allergies.  MEDICATIONS:  Current Outpatient Medications  Medication Sig Dispense Refill   cholecalciferol (VITAMIN D3) 25 MCG (1000 UT) tablet Take 2,000 Units by mouth daily.      enoxaparin (LOVENOX) 120 MG/0.8ML injection Inject 110 mg into the skin every 12 (twelve) hours. Current dose 04/14/19     escitalopram (LEXAPRO) 20 MG tablet Take 1 tablet by mouth daily.     rivaroxaban (XARELTO) 20 MG TABS tablet Take 1 tablet (20 mg total) by mouth daily with supper. 90 tablet 2   No current facility-administered medications for this visit.      Marland Kitchen  PHYSICAL EXAMINATION: ECOG PERFORMANCE STATUS: 0 - Asymptomatic  Vitals:   11/02/20 1500  BP: 98/76  Pulse: 74  Resp: 20  Temp: 98.6 F (37 C)    Filed Weights   11/02/20 1503  Weight: 257 lb 15 oz (117 kg)   Physical Exam Vitals and nursing note reviewed.  Constitutional:      Comments: Ambulating: independently   Alone   HENT:     Head: Normocephalic and atraumatic.     Mouth/Throat:     Pharynx: Oropharynx is clear.  Eyes:     Extraocular Movements: Extraocular movements intact.     Pupils: Pupils are equal, round, and reactive to light.  Cardiovascular:     Rate and Rhythm: Normal rate and regular rhythm.  Pulmonary:     Comments: Decreased breath sounds bilaterally.  Abdominal:     Palpations: Abdomen is soft.  Musculoskeletal:        General: Normal range of motion.     Cervical back: Normal range of motion.  Skin:    General: Skin is warm.  Neurological:     General: No focal deficit present.     Mental Status: She is alert and oriented to person, place, and time.  Psychiatric:        Behavior: Behavior normal.        Judgment: Judgment normal.     LABORATORY DATA:  I have reviewed the data as listed Lab Results  Component Value Date   WBC  6.3 11/02/2020   HGB 12.9 11/02/2020   HCT 36.8 11/02/2020   MCV 83.6 11/02/2020   PLT 270 11/02/2020   Recent Labs    11/02/20 1524  NA 135  K 3.4*  CL 104  CO2 23  GLUCOSE 106*  BUN 15  CREATININE 0.68  CALCIUM 9.3  GFRNONAA >60  PROT 7.6  ALBUMIN 4.3  AST 23  ALT 26  ALKPHOS 85  BILITOT 0.5     RADIOGRAPHIC STUDIES: I have personally reviewed the radiological images as listed and agreed with the findings in the report. No results found.  ASSESSMENT & PLAN:   Primary hypercoagulable state [facility]) # Prior history of PE [provoked-long car ride/BCPs]; and also LE [below  knee DVT- during post partum]; with a history of protein S deficiency.  Patient currently on indefinite anticoagulation.  #Since patient is postpartum/finished breast-feeding-I think is reasonable to switch over from Lovenox to Xarelto.  New prescription started.  Again discussed the precautions.  # DISPOSITION: # today labs- cbc/cmp # follow up in 6 months- MD: labs cbc/bmp-Dr.B  All questions were answered. The patient knows to call the clinic with any problems, questions or concerns.     Earna Coder, MD 11/28/2020 9:21 PM

## 2021-05-10 ENCOUNTER — Inpatient Hospital Stay (HOSPITAL_BASED_OUTPATIENT_CLINIC_OR_DEPARTMENT_OTHER): Payer: No Typology Code available for payment source | Admitting: Internal Medicine

## 2021-05-10 ENCOUNTER — Inpatient Hospital Stay: Payer: No Typology Code available for payment source | Attending: Internal Medicine

## 2021-05-10 ENCOUNTER — Encounter: Payer: Self-pay | Admitting: Internal Medicine

## 2021-05-10 ENCOUNTER — Other Ambulatory Visit: Payer: Self-pay

## 2021-05-10 DIAGNOSIS — Z7901 Long term (current) use of anticoagulants: Secondary | ICD-10-CM | POA: Insufficient documentation

## 2021-05-10 DIAGNOSIS — D6859 Other primary thrombophilia: Secondary | ICD-10-CM | POA: Insufficient documentation

## 2021-05-10 DIAGNOSIS — Z79899 Other long term (current) drug therapy: Secondary | ICD-10-CM | POA: Insufficient documentation

## 2021-05-10 DIAGNOSIS — Z86718 Personal history of other venous thrombosis and embolism: Secondary | ICD-10-CM | POA: Insufficient documentation

## 2021-05-10 DIAGNOSIS — Z86711 Personal history of pulmonary embolism: Secondary | ICD-10-CM | POA: Insufficient documentation

## 2021-05-10 LAB — CBC WITH DIFFERENTIAL/PLATELET
Abs Immature Granulocytes: 0.02 10*3/uL (ref 0.00–0.07)
Basophils Absolute: 0 10*3/uL (ref 0.0–0.1)
Basophils Relative: 0 %
Eosinophils Absolute: 0.2 10*3/uL (ref 0.0–0.5)
Eosinophils Relative: 3 %
HCT: 37.4 % (ref 36.0–46.0)
Hemoglobin: 12.6 g/dL (ref 12.0–15.0)
Immature Granulocytes: 0 %
Lymphocytes Relative: 35 %
Lymphs Abs: 2.7 10*3/uL (ref 0.7–4.0)
MCH: 28.4 pg (ref 26.0–34.0)
MCHC: 33.7 g/dL (ref 30.0–36.0)
MCV: 84.4 fL (ref 80.0–100.0)
Monocytes Absolute: 0.5 10*3/uL (ref 0.1–1.0)
Monocytes Relative: 6 %
Neutro Abs: 4.3 10*3/uL (ref 1.7–7.7)
Neutrophils Relative %: 56 %
Platelets: 322 10*3/uL (ref 150–400)
RBC: 4.43 MIL/uL (ref 3.87–5.11)
RDW: 13.5 % (ref 11.5–15.5)
WBC: 7.8 10*3/uL (ref 4.0–10.5)
nRBC: 0 % (ref 0.0–0.2)

## 2021-05-10 LAB — BASIC METABOLIC PANEL
Anion gap: 6 (ref 5–15)
BUN: 13 mg/dL (ref 6–20)
CO2: 26 mmol/L (ref 22–32)
Calcium: 9 mg/dL (ref 8.9–10.3)
Chloride: 105 mmol/L (ref 98–111)
Creatinine, Ser: 0.62 mg/dL (ref 0.44–1.00)
GFR, Estimated: >60 mL/min (ref >60)
Glucose, Bld: 105 mg/dL — ABNORMAL HIGH (ref 70–99)
Potassium: 4.2 mmol/L (ref 3.5–5.1)
Sodium: 137 mmol/L (ref 135–145)

## 2021-05-10 MED ORDER — RIVAROXABAN 20 MG PO TABS: 20.0000 mg | ORAL_TABLET | Freq: Every day | ORAL | 2 refills | Status: DC

## 2021-05-10 NOTE — Assessment & Plan Note (Addendum)
#   Prior history of PE [provoked-long car ride/BCPs]; and also LE [below knee DVT- during post partum]; with a history of protein S deficiency.  Patient currently on indefinite anticoagulation with xarelto. Refilled xalreto.   # DISPOSITION: # follow up in 6 months- MD: labs cbc/bmp-Dr.B

## 2021-05-10 NOTE — Progress Notes (Signed)
Needs refill on xarelto.

## 2021-05-10 NOTE — Progress Notes (Signed)
[facility] CONSULT NOTE  Patient Care Team: Duanne Limerick, MD as PCP - General (Family Medicine)  CHIEF COMPLAINTS/PURPOSE OF CONSULTATION:   # June 2018- LE DVT [below knee; appx 10 weeks post partum]- on Lovenox; stop Sep 19th 2018; OCT 2018- PROTEIN S/ Activity- LOW;OFF ANti-COAG- D-DIMER- 1000; RECOMMENDED INDEFINITE ANTI-COAG  NOV 11th 2018- Re-START Lovenox 100 mg SQ/day; March 13th 2019- Xarelto 20mg /day; AUG 20th 2019- STOP xarelto; START LOVENOX 150mg  SQ q day; s/p PREGNANCY/delivery- July 2022-start Xarelto.  # 2013 SEP- Pulmonary embolus [BCPs/long car trip]- xarelto x 6 M; Brother Protein S deficiency.   Oncology History   No history exists.    HISTORY OF PRESENTING ILLNESS:  Michelle Hogan 36 y.o.  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  Denies any blood in stools or black or stools.  She is trying to lose weight.  Patient denies any blood in stools or black or stools.   Review of Systems  Constitutional:  Negative for chills, diaphoresis, fever, malaise/fatigue and weight loss.  HENT:  Negative for nosebleeds and sore throat.   Eyes:  Negative for double vision.  Respiratory:  Negative for cough, hemoptysis, sputum production, shortness of breath and wheezing.   Cardiovascular:  Negative for chest pain, palpitations, orthopnea and leg swelling.  Gastrointestinal:  Negative for abdominal pain, blood in stool, constipation, diarrhea, heartburn, melena, nausea and vomiting.  Genitourinary:  Negative for dysuria, frequency and urgency.  Musculoskeletal:  Negative for back pain and joint pain.  Skin: Negative.  Negative for itching and rash.  Neurological:  Negative for dizziness, tingling, focal weakness, weakness and headaches.  Endo/Heme/Allergies:  Does not bruise/bleed easily.  Psychiatric/Behavioral:  Negative for depression. The patient  is not nervous/anxious and does not have insomnia.     MEDICAL HISTORY:  Past Medical History:  Diagnosis Date   Depression    DVT (deep venous thrombosis) (HCC)    PE (pulmonary embolism)    Protein S deficiency (HCC)     SURGICAL HISTORY: Past Surgical History:  Procedure Laterality Date   ANKLE SURGERY Left    CESAREAN SECTION N/A 07/17/2016   Procedure: C-Section;  Surgeon: 31 Ward, MD;  Location: [facility] ORS;  Service: Obstetrics;  Laterality: N/A;   CHOLECYSTECTOMY N/A 04/23/2017   Procedure: LAPAROSCOPIC CHOLECYSTECTOMY;  Surgeon: Elenora Fender, MD;  Location: [facility] ORS;  Service: General;  Laterality: N/A;    SOCIAL HISTORY: no smoking; stay home mom; in [place]. Rare alcohol Social History   Socioeconomic History   Marital status: Married    Spouse name: [name]   Number of children: Not on file   Years of education: Not on file   Highest education level: Not on file  Occupational History   Not on file  Tobacco Use   Smoking status: Never   Smokeless tobacco: Never  Vaping Use   Vaping Use: Never used  Substance and Sexual Activity   Alcohol use: Not Currently    Alcohol/week: 1.0 standard drink    Types: 1 Cans of beer per week    Comment: rarely   Drug use: No   Sexual activity: Yes    Birth control/protection: I.U.D.  Other Topics Concern   Not on file  Social History Narrative   Not on file   Social Determinants of Health   Financial Resource Strain: Not on file  Food Insecurity: Not on file  Transportation  Needs: Not on file  Physical Activity: Not on file  Stress: Not on file  Social Connections: Not on file  Intimate Partner Violence: Not on file    FAMILY HISTORY: mom's cousin [29 died on blood clots]; brother blood clot; mom- no blood clots. Family History  Problem Relation Age of Onset   Hyperlipidemia Mother    Depression Mother    Heart attack Father    Heart disease Father    Deep vein thrombosis Brother    Diabetes  Maternal Grandfather    Bladder Cancer Maternal Grandfather     ALLERGIES:  has No Known Allergies.  MEDICATIONS:  Current Outpatient Medications  Medication Sig Dispense Refill   cholecalciferol (VITAMIN D3) 25 MCG (1000 UT) tablet Take 2,000 Units by mouth daily.      escitalopram (LEXAPRO) 20 MG tablet Take 1 tablet by mouth daily.     rivaroxaban (XARELTO) 20 MG TABS tablet Take 1 tablet (20 mg total) by mouth daily with supper. 90 tablet 2   No current facility-administered medications for this visit.      Marland Kitchen.  PHYSICAL EXAMINATION: ECOG PERFORMANCE STATUS: 0 - Asymptomatic  Vitals:   05/10/21 1538  BP: 117/85  Pulse: 77  Temp: (!) 97 F (36.1 C)  SpO2: 100%     Filed Weights   05/10/21 1538  Weight: 266 lb 12.8 oz (121 kg)    Physical Exam Vitals and nursing note reviewed.  Constitutional:      Comments: Ambulating: independently   Alone   HENT:     Head: Normocephalic and atraumatic.     Mouth/Throat:     Pharynx: Oropharynx is clear.  Eyes:     Extraocular Movements: Extraocular movements intact.     Pupils: Pupils are equal, round, and reactive to light.  Cardiovascular:     Rate and Rhythm: Normal rate and regular rhythm.  Pulmonary:     Comments: Decreased breath sounds bilaterally.  Abdominal:     Palpations: Abdomen is soft.  Musculoskeletal:        General: Normal range of motion.     Cervical back: Normal range of motion.  Skin:    General: Skin is warm.  Neurological:     General: No focal deficit present.     Mental Status: She is alert and oriented to person, place, and time.  Psychiatric:        Behavior: Behavior normal.        Judgment: Judgment normal.     LABORATORY DATA:  I have reviewed the data as listed Lab Results  Component Value Date   WBC 7.8 05/10/2021   HGB 12.6 05/10/2021   HCT 37.4 05/10/2021   MCV 84.4 05/10/2021   PLT 322 05/10/2021   Recent Labs    11/02/20 1524 05/10/21 1525  NA 135 137  K 3.4*  4.2  CL 104 105  CO2 23 26  GLUCOSE 106* 105*  BUN 15 13  CREATININE 0.68 0.62  CALCIUM 9.3 9.0  GFRNONAA >60 >60  PROT 7.6  --   ALBUMIN 4.3  --   AST 23  --   ALT 26  --   ALKPHOS 85  --   BILITOT 0.5  --      RADIOGRAPHIC STUDIES: I have personally reviewed the radiological images as listed and agreed with the findings in the report. No results found.  ASSESSMENT & PLAN:   Primary hypercoagulable state [facility](HCC) # Prior history of PE [provoked-long car ride/BCPs]; and  also LE [below knee DVT- during post partum]; with a history of protein S deficiency.  Patient currently on indefinite anticoagulation with xarelto. Refilled xalreto.   # DISPOSITION: # follow up in 6 months- MD: labs cbc/bmp-Dr.B  All questions were answered. The patient knows to call the clinic with any problems, questions or concerns.     Earna Coder, MD 05/10/2021 3:59 PM

## 2021-06-08 ENCOUNTER — Encounter: Payer: Self-pay | Admitting: *Deleted

## 2021-06-20 ENCOUNTER — Emergency Department
Admission: EM | Admit: 2021-06-20 | Discharge: 2021-06-20 | Disposition: A | Discharge status: Home / Self Care | Payer: No Typology Code available for payment source | Attending: Emergency Medicine | Admitting: Emergency Medicine

## 2021-06-20 ENCOUNTER — Other Ambulatory Visit: Payer: Self-pay

## 2021-06-20 ENCOUNTER — Emergency Department: Payer: No Typology Code available for payment source

## 2021-06-20 ENCOUNTER — Encounter: Payer: Self-pay | Admitting: Intensive Care

## 2021-06-20 DIAGNOSIS — M542 Cervicalgia: Secondary | ICD-10-CM | POA: Diagnosis present

## 2021-06-20 DIAGNOSIS — Z7901 Long term (current) use of anticoagulants: Secondary | ICD-10-CM | POA: Insufficient documentation

## 2021-06-20 DIAGNOSIS — M5412 Radiculopathy, cervical region: Secondary | ICD-10-CM | POA: Diagnosis not present

## 2021-06-20 LAB — BASIC METABOLIC PANEL
Anion gap: 6 (ref 5–15)
BUN: 10 mg/dL (ref 6–20)
CO2: 25 mmol/L (ref 22–32)
Calcium: 9.2 mg/dL (ref 8.9–10.3)
Chloride: 105 mmol/L (ref 98–111)
Creatinine, Ser: 0.65 mg/dL (ref 0.44–1.00)
GFR, Estimated: >60 mL/min (ref >60)
Glucose, Bld: 88 mg/dL (ref 70–99)
Potassium: 3.9 mmol/L (ref 3.5–5.1)
Sodium: 136 mmol/L (ref 135–145)

## 2021-06-20 LAB — CBC WITH DIFFERENTIAL/PLATELET
Abs Immature Granulocytes: 0.02 10*3/uL (ref 0.00–0.07)
Basophils Absolute: 0 10*3/uL (ref 0.0–0.1)
Basophils Relative: 1 %
Eosinophils Absolute: 0.2 10*3/uL (ref 0.0–0.5)
Eosinophils Relative: 3 %
HCT: 41.1 % (ref 36.0–46.0)
Hemoglobin: 13.4 g/dL (ref 12.0–15.0)
Immature Granulocytes: 0 %
Lymphocytes Relative: 35 %
Lymphs Abs: 2.9 10*3/uL (ref 0.7–4.0)
MCH: 27.9 pg (ref 26.0–34.0)
MCHC: 32.6 g/dL (ref 30.0–36.0)
MCV: 85.4 fL (ref 80.0–100.0)
Monocytes Absolute: 0.4 10*3/uL (ref 0.1–1.0)
Monocytes Relative: 5 %
Neutro Abs: 4.6 10*3/uL (ref 1.7–7.7)
Neutrophils Relative %: 56 %
Platelets: 317 10*3/uL (ref 150–400)
RBC: 4.81 MIL/uL (ref 3.87–5.11)
RDW: 13.3 % (ref 11.5–15.5)
WBC: 8.2 10*3/uL (ref 4.0–10.5)
nRBC: 0 % (ref 0.0–0.2)

## 2021-06-20 LAB — POC URINE PREG, ED: Preg Test, Ur: NEGATIVE

## 2021-06-20 MED ORDER — IOHEXOL 350 MG/ML SOLN
75.0000 mL | Freq: Once | INTRAVENOUS | Status: AC | PRN
Start: 2021-06-20 — End: 2021-06-20
  Administered 2021-06-20: 75 mL via INTRAVENOUS

## 2021-06-20 NOTE — ED Provider Triage Note (Signed)
°  Emergency Medicine Provider Triage Evaluation Note  Michelle Hogan , a 36 y.o.female,  was evaluated in triage.  Pt complains of chest/arm pain.  Patient states that she has been experiencing pain in her neck for the past 2 weeks, but has recently had sharp pain under her right collarbone.  She states it feels similar to the time that she had a pulmonary embolism a few years back when she had pain under her left collarbone.  Additionally reports numbness rating down to her right lower extremity.   Review of Systems  Positive: Chest pain, arm pain, numbness Negative: Denies fever, abdominal pain, vomiting  Physical Exam   Vitals:   06/20/21 1748  BP: 127/83  Pulse: 84  Resp: 16  Temp: 99.2 F (37.3 C)  SpO2: 100%   Gen:   Awake, no distress   Resp:  Normal effort  MSK:   Moves extremities without difficulty  Other:  Pulse, motor, sensation intact distally.  Medical Decision Making  Given the patient's initial medical screening exam, the following diagnostic evaluation has been ordered. The patient will be placed in the appropriate treatment space, once one is available, to complete the evaluation and treatment. I have discussed the plan of care with the patient and I have advised the patient that an ED physician or mid-level practitioner will reevaluate their condition after the test results have been received, as the results may give them additional insight into the type of treatment they may need.    Diagnostics: Labs, CT PE  Treatments: none immediately   Varney Daily, [place] 06/20/21 1754

## 2021-06-20 NOTE — ED Notes (Signed)
Discharge instructions discussed with pt. Pt verbalized understanding with no questions at this time.  

## 2021-06-20 NOTE — ED Provider Notes (Signed)
[facility] Provider Note    Event Date/Time   First MD Initiated Contact with Patient 06/20/21 2019     (approximate)   History   Arm Pain   HPI  Michelle Hogan is a 36 y.o. 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  The pain in her neck she describes as an achiness started couple days ago and is fairly bilateral but that in the last day or so started become more sharp and shooting under the right collarbone into the right upper arm.  She does not recall any injuries or falls.  No shortness of breath, cough, fevers, other chest pains or back pains, abdominal pain, headache, earache, sore throat, vomiting, diarrhea rash or extremity pain.  No history of cervical radiculopathy.  She has been compliant with her Xarelto.  No other acute concerns at this time.  Patient states she has been taking Tylenol ibuprofen but does not feel this helped much.  She states her primary concern is to rule out repeat blood clot.      Physical Exam  Triage Vital Signs: ED Triage Vitals  Enc Vitals Group     BP 06/20/21 1748 127/83     Pulse Rate 06/20/21 1748 84     Resp 06/20/21 1748 16     Temp 06/20/21 1748 99.2 F (37.3 C)     Temp Source 06/20/21 1748 Oral     SpO2 06/20/21 1748 100 %     Weight 06/20/21 1750 260 lb (117.9 kg)     Height 06/20/21 1750 5\' 5"  (1.651 m)     Head Circumference --      Peak Flow --      Pain Score 06/20/21 1748 2     Pain Loc --      Pain Edu? --      Excl. in GC? --     Most recent vital signs: Vitals:   06/20/21 1748  BP: 127/83  Pulse: 84  Resp: 16  Temp: 99.2 F (37.3 C)  SpO2: 100%    General: Awake, no distress.  CV:  Good peripheral perfusion.  Resp:  Normal effort.  Clear bilaterally. Abd:  No distention.  Other:  Cranial nerves II through XII are grossly  intact.  Patient has full strength and range of motion in the right upper extremity.  2+ radial pulse.  Sensation is intact in the distribution of the axillary median, radial and ulnar nerves.  There is no effusion deformity or overlying skin changes of the shoulder or trapezius or otherwise throughout the arm.  Pain is to be slightly exacerbated by tilting of the head towards the right side.   ED Results / Procedures / Treatments  Labs (all labs ordered are listed, but only abnormal results are displayed) Labs Reviewed  BASIC METABOLIC PANEL  CBC WITH DIFFERENTIAL/PLATELET  POC URINE PREG, ED     EKG  ECG shows sinus rhythm with a ventricular rate of 69, normal axis, unremarkable intervals without evidence of acute ischemia or significant arrhythmia.   RADIOLOGY CTA interpreted by myself shows no evidence of PE, rib fracture, pneumothorax, pneumonia or other clear acute thoracic process.  I also reviewed radiology's interpretation and agree with their findings of no evidence of PE or other acute process.   PROCEDURES:  Critical  Care performed: No  Procedures   MEDICATIONS ORDERED IN ED: Medications  iohexol (OMNIPAQUE) 350 MG/ML injection 75 mL (75 mLs Intravenous Contrast Given 06/20/21 1903)     IMPRESSION / MDM / ASSESSMENT AND PLAN / ED COURSE  I reviewed the triage vital signs and the nursing notes.                              Differential diagnosis includes, but is not limited to recurrent breakthrough PE, apical pneumothorax, apical pneumonia, cervical radiculopathy, muscle spasm with lower suspicion for acute traumatic injury, cervical dissection or acute infectious process based on history and exam.  CTA chest is negative for PE or other acute thoracic process.  No evidence on exam of a cellulitis.  BMP is unremarkable.  CBC without leukocytosis or acute anemia.  EKG not suggestive of ACS.  I suspect likely a cervical radiculopathy.  Offered gabapentin outpatient  states she declines this at this time.  Given she currently has full strength and range of motion as well as intact sensation do not believe she requires emergent imaging or neuro surgery consult.  Discussed if she develops any other symptoms or worsening of her pain she needs to return immediately for imaging of her neck.  Otherwise at this point I think she is stable for discharge with outpatient follow-up.  Discharged in stable condition.  Strict return precautions advised and discussed.      FINAL CLINICAL IMPRESSION(S) / ED DIAGNOSES   Final diagnoses:  Cervical radiculopathy     Rx / DC Orders   ED Discharge Orders     None        Note:  This document was prepared using Dragon voice recognition software and may include unintentional dictation errors.   Gilles Chiquito, MD 06/20/21 [phone]

## 2021-06-20 NOTE — ED Triage Notes (Signed)
Crick in neck started two weeks ago then sharp pains over shoulder and under collar bone/down arm started a few days ago. HX blood clots and takes blood thinner daily.

## 2021-11-14 ENCOUNTER — Ambulatory Visit: Payer: No Typology Code available for payment source | Admitting: Internal Medicine

## 2021-11-14 ENCOUNTER — Other Ambulatory Visit: Payer: No Typology Code available for payment source

## 2021-11-14 ENCOUNTER — Inpatient Hospital Stay (HOSPITAL_BASED_OUTPATIENT_CLINIC_OR_DEPARTMENT_OTHER): Payer: No Typology Code available for payment source | Admitting: Nurse Practitioner

## 2021-11-14 ENCOUNTER — Inpatient Hospital Stay: Payer: No Typology Code available for payment source | Attending: Nurse Practitioner

## 2021-11-14 ENCOUNTER — Encounter: Payer: Self-pay | Admitting: Nurse Practitioner

## 2021-11-14 VITALS — BP 110/72 | HR 78 | Temp 98.7°F | Resp 20 | Wt 269.0 lb

## 2021-11-14 DIAGNOSIS — Z79899 Other long term (current) drug therapy: Secondary | ICD-10-CM | POA: Diagnosis not present

## 2021-11-14 DIAGNOSIS — D6859 Other primary thrombophilia: Secondary | ICD-10-CM | POA: Insufficient documentation

## 2021-11-14 DIAGNOSIS — Z86711 Personal history of pulmonary embolism: Secondary | ICD-10-CM | POA: Diagnosis present

## 2021-11-14 DIAGNOSIS — Z7901 Long term (current) use of anticoagulants: Secondary | ICD-10-CM | POA: Diagnosis not present

## 2021-11-14 DIAGNOSIS — Z86718 Personal history of other venous thrombosis and embolism: Secondary | ICD-10-CM | POA: Insufficient documentation

## 2021-11-14 LAB — BASIC METABOLIC PANEL
Anion gap: 6 (ref 5–15)
BUN: 9 mg/dL (ref 6–20)
CO2: 26 mmol/L (ref 22–32)
Calcium: 8.7 mg/dL — ABNORMAL LOW (ref 8.9–10.3)
Chloride: 107 mmol/L (ref 98–111)
Creatinine, Ser: 0.57 mg/dL (ref 0.44–1.00)
GFR, Estimated: >60 mL/min (ref >60)
Glucose, Bld: 124 mg/dL — ABNORMAL HIGH (ref 70–99)
Potassium: 3.8 mmol/L (ref 3.5–5.1)
Sodium: 139 mmol/L (ref 135–145)

## 2021-11-14 LAB — CBC WITH DIFFERENTIAL/PLATELET
Abs Immature Granulocytes: 0.02 10*3/uL (ref 0.00–0.07)
Basophils Absolute: 0 10*3/uL (ref 0.0–0.1)
Basophils Relative: 0 %
Eosinophils Absolute: 0.2 10*3/uL (ref 0.0–0.5)
Eosinophils Relative: 3 %
HCT: 38.1 % (ref 36.0–46.0)
Hemoglobin: 12.6 g/dL (ref 12.0–15.0)
Immature Granulocytes: 0 %
Lymphocytes Relative: 28 %
Lymphs Abs: 1.9 10*3/uL (ref 0.7–4.0)
MCH: 28.9 pg (ref 26.0–34.0)
MCHC: 33.1 g/dL (ref 30.0–36.0)
MCV: 87.4 fL (ref 80.0–100.0)
Monocytes Absolute: 0.4 10*3/uL (ref 0.1–1.0)
Monocytes Relative: 6 %
Neutro Abs: 4.4 10*3/uL (ref 1.7–7.7)
Neutrophils Relative %: 63 %
Platelets: 238 10*3/uL (ref 150–400)
RBC: 4.36 MIL/uL (ref 3.87–5.11)
RDW: 13.2 % (ref 11.5–15.5)
WBC: 7 10*3/uL (ref 4.0–10.5)
nRBC: 0 % (ref 0.0–0.2)

## 2021-11-14 NOTE — Progress Notes (Signed)
[facility] CONSULT NOTE  Patient Care Team: Pcp, No as PCP - General  CHIEF COMPLAINTS/PURPOSE OF CONSULTATION:   # June 2018- LE DVT [below knee; appx 10 weeks post partum]- on Lovenox; stop Sep 19th 2018; OCT 2018- PROTEIN S/ Activity- LOW;OFF ANti-COAG- D-DIMER- 1000; RECOMMENDED INDEFINITE ANTI-COAG  NOV 11th 2018- Re-START Lovenox 100 mg SQ/day; March 13th 2019- Xarelto 20mg /day; AUG 20th 2019- STOP xarelto; START LOVENOX 150mg  SQ q day; s/p PREGNANCY/delivery- July 2022-start Xarelto.  # 2013 SEP- Pulmonary embolus [BCPs/long car trip]- xarelto x 6 M; Brother Protein S deficiency.   Oncology History   No history exists.    HISTORY OF PRESENTING ILLNESS:  Here for f/u. Has hx of protein S deficiency and hx of provoked DVT/PE. On Xarelto indefinitely.   Denies any interval blood clots. Evaluated on 06/20/21 for neck pain worrisome for blood clot. Imaging was negative. No bleeding. No pain. No additional concerns.   Review of Systems  All other systems reviewed and are negative.    MEDICAL HISTORY:  Past Medical History:  Diagnosis Date   Depression    DVT (deep venous thrombosis) (HCC)    PE (pulmonary embolism)    Protein S deficiency (HCC)     SURGICAL HISTORY: Past Surgical History:  Procedure Laterality Date   ANKLE SURGERY Left    CESAREAN SECTION N/A 07/17/2016   Procedure: C-Section;  Surgeon: 06/22/21 Ward, MD;  Location: [facility] ORS;  Service: Obstetrics;  Laterality: N/A;   CHOLECYSTECTOMY N/A 04/23/2017   Procedure: LAPAROSCOPIC CHOLECYSTECTOMY;  Surgeon: Elenora Fender, MD;  Location: [facility] ORS;  Service: General;  Laterality: N/A;    SOCIAL HISTORY: no smoking; stay home mom; in [place]. Rare alcohol Social History   Socioeconomic History   Marital status: Married    Spouse name: [name]   Number of children: Not on file   Years of education: Not on file   Highest education level: Not on file  Occupational History   Not on file   Tobacco Use   Smoking status: Never   Smokeless tobacco: Never  Vaping Use   Vaping Use: Never used  Substance and Sexual Activity   Alcohol use: Not Currently    Alcohol/week: 1.0 standard drink of alcohol    Types: 1 Cans of beer per week    Comment: rarely   Drug use: No   Sexual activity: Yes    Birth control/protection: I.U.D.  Other Topics Concern   Not on file  Social History Narrative   Not on file   Social Determinants of Health   Financial Resource Strain: Not on file  Food Insecurity: Not on file  Transportation Needs: Not on file  Physical Activity: Not on file  Stress: Not on file  Social Connections: Not on file  Intimate Partner Violence: Not on file    FAMILY HISTORY: mom's cousin 09/09/22 died on blood clots]; brother blood clot; mom- no blood clots. Family History  Problem Relation Age of Onset   Hyperlipidemia Mother    Depression Mother    Heart attack Father    Heart disease Father    Deep vein thrombosis Brother    Diabetes Maternal Grandfather    Bladder Cancer Maternal Grandfather     ALLERGIES:  has No Known Allergies.  MEDICATIONS:  Current Outpatient Medications  Medication Sig Dispense Refill   cholecalciferol (VITAMIN D3) 25 MCG (1000 UT) tablet Take 2,000 Units by mouth daily.      escitalopram (LEXAPRO) 20 MG  tablet Take 1 tablet by mouth daily.     rivaroxaban (XARELTO) 20 MG TABS tablet Take 1 tablet (20 mg total) by mouth daily with supper. 90 tablet 2   No current facility-administered medications for this visit.      Marland Kitchen  PHYSICAL EXAMINATION: ECOG PERFORMANCE STATUS: 0 - Asymptomatic  Vitals:   11/14/21 0916  BP: 110/72  Pulse: 78  Resp: 20  Temp: 98.7 F (37.1 C)  SpO2: 100%     Filed Weights   11/14/21 0916  Weight: 269 lb (122 kg)    Physical Exam Vitals reviewed.  Constitutional:      Appearance: Normal appearance. She is obese.  Cardiovascular:     Rate and Rhythm: Normal rate and regular rhythm.   Pulmonary:     Effort: Pulmonary effort is normal.     Breath sounds: Normal breath sounds.  Abdominal:     General: Bowel sounds are normal.  Neurological:     Mental Status: She is alert and oriented to person, place, and time.      LABORATORY DATA:  I have reviewed the data as listed Lab Results  Component Value Date   WBC 7.0 11/14/2021   HGB 12.6 11/14/2021   HCT 38.1 11/14/2021   MCV 87.4 11/14/2021   PLT 238 11/14/2021   Recent Labs    05/10/21 1525 06/20/21 1753 11/14/21 0851  NA 137 136 139  K 4.2 3.9 3.8  CL 105 105 107  CO2 26 25 26   GLUCOSE 105* 88 124*  BUN 13 10 9   CREATININE 0.62 0.65 0.57  CALCIUM 9.0 9.2 8.7*  GFRNONAA >60 >60 >60      RADIOGRAPHIC STUDIES: I have personally reviewed the radiological images as listed and agreed with the findings in the report. No results found.  ASSESSMENT & PLAN:   Primary hypercoagulable state- Hx of PE after long car ride and LE DVT post partum. Found to have protein S deficiency. On indefinite anticoagulation with Xarelto. Tolerating well. Reviewed labs today which are unremarkable. Continue Xarelto.   Disposition- RTC in 6 months for follow-up with labs and see Dr. .   No problem-specific Assessment & Plan notes found for this encounter.  All questions were answered. The patient knows to call the clinic with any problems, questions or concerns.  I spent 25 minutes dedicated to the care of this patient (face-to-face and non-face-to-face) on the date of the encounter to include what is described in the assessment and plan.,    , NP 11/14/2021 10:04 AM

## 2022-05-17 ENCOUNTER — Inpatient Hospital Stay: Payer: No Typology Code available for payment source

## 2022-05-17 ENCOUNTER — Inpatient Hospital Stay: Payer: No Typology Code available for payment source | Admitting: Internal Medicine

## 2022-05-25 ENCOUNTER — Inpatient Hospital Stay: Payer: No Typology Code available for payment source

## 2022-05-25 ENCOUNTER — Inpatient Hospital Stay: Payer: No Typology Code available for payment source | Attending: Internal Medicine | Admitting: Internal Medicine

## 2022-05-25 VITALS — BP 120/70 | Wt 283.0 lb

## 2022-05-25 DIAGNOSIS — Z86718 Personal history of other venous thrombosis and embolism: Secondary | ICD-10-CM | POA: Insufficient documentation

## 2022-05-25 DIAGNOSIS — D6859 Other primary thrombophilia: Secondary | ICD-10-CM | POA: Diagnosis not present

## 2022-05-25 DIAGNOSIS — Z86711 Personal history of pulmonary embolism: Secondary | ICD-10-CM | POA: Diagnosis present

## 2022-05-25 DIAGNOSIS — Z7901 Long term (current) use of anticoagulants: Secondary | ICD-10-CM | POA: Diagnosis not present

## 2022-05-25 LAB — CBC WITH DIFFERENTIAL/PLATELET
Abs Immature Granulocytes: 0.02 10*3/uL (ref 0.00–0.07)
Basophils Absolute: 0 10*3/uL (ref 0.0–0.1)
Basophils Relative: 0 %
Eosinophils Absolute: 0.2 10*3/uL (ref 0.0–0.5)
Eosinophils Relative: 3 %
HCT: 37.3 % (ref 36.0–46.0)
Hemoglobin: 12.5 g/dL (ref 12.0–15.0)
Immature Granulocytes: 0 %
Lymphocytes Relative: 33 %
Lymphs Abs: 2.2 10*3/uL (ref 0.7–4.0)
MCH: 28.5 pg (ref 26.0–34.0)
MCHC: 33.5 g/dL (ref 30.0–36.0)
MCV: 85.2 fL (ref 80.0–100.0)
Monocytes Absolute: 0.4 10*3/uL (ref 0.1–1.0)
Monocytes Relative: 6 %
Neutro Abs: 3.9 10*3/uL (ref 1.7–7.7)
Neutrophils Relative %: 58 %
Platelets: 276 10*3/uL (ref 150–400)
RBC: 4.38 MIL/uL (ref 3.87–5.11)
RDW: 13.4 % (ref 11.5–15.5)
WBC: 6.7 10*3/uL (ref 4.0–10.5)
nRBC: 0 % (ref 0.0–0.2)

## 2022-05-25 LAB — BASIC METABOLIC PANEL
Anion gap: 9 (ref 5–15)
BUN: 12 mg/dL (ref 6–20)
CO2: 22 mmol/L (ref 22–32)
Calcium: 8.5 mg/dL — ABNORMAL LOW (ref 8.9–10.3)
Chloride: 107 mmol/L (ref 98–111)
Creatinine, Ser: 0.81 mg/dL (ref 0.44–1.00)
GFR, Estimated: >60 mL/min (ref >60)
Glucose, Bld: 99 mg/dL (ref 70–99)
Potassium: 3.8 mmol/L (ref 3.5–5.1)
Sodium: 138 mmol/L (ref 135–145)

## 2022-05-25 MED ORDER — RIVAROXABAN 20 MG PO TABS: 20.0000 mg | ORAL_TABLET | Freq: Every day | ORAL | 0 refills | Status: DC

## 2022-05-25 NOTE — Assessment & Plan Note (Addendum)
#  Prior history of PE [provoked-long car ride/BCPs]; and also LE [below knee DVT- during post partum]; with a history of protein S deficiency.  Patient currently on indefinite anticoagulation with xarelto. Refilled xalreto for 3 months.  Further refills patient will follow-up with PCP. Stable.   # Hypocalcemia: 8.5. recommend calcium plus vitamin D.  Recommend Check in with PCP regarding vitamin D checks.   #Since patient is clinically stable I think is reasonable for the patient to follow-up with PCP/can follow-up with [place] as needed.  Patient comfortable with the plan; to call us if any questions or concerns in the interim.  Patient is currently awaiting appointment with PCP-virtual.   # DISPOSITION: print copy of labs today # follow up as needed -Dr.B

## 2022-05-25 NOTE — Progress Notes (Signed)
I connected with Michelle Hogan on 05/25/22 at 10:00 AM EST by video enabled telemedicine visit and verified that I am speaking with the correct person using two identifiers.  I discussed the limitations, risks, security and privacy concerns of performing an evaluation and management service by telemedicine and the availability of in-person appointments. I also discussed with the patient that there may be a patient responsible charge related to this service. The patient expressed understanding and agreed to proceed.    Other persons participating in the visit and their role in the encounter: RN/medical reconciliation Patient's location: home Provider's location: office  Oncology History   No history exists.   Chief Complaint: Hypercoagulable state   History of present illness:Michelle Hogan 37 y.o.  female with history of protein S deficiency on Xarelto indefinitely is here for follow-up.  Patient denies any new blood clots.  Denies any bleeding.  Denies any shortness of breath or cough.   Observation/objective: Alert & oriented x 3. In No acute distress.   Assessment and plan: Primary hypercoagulable state ([place]) # Prior history of PE [provoked-long car ride/BCPs]; and also LE [below knee DVT- during post partum]; with a history of protein S deficiency.  Patient currently on indefinite anticoagulation with xarelto. Refilled xalreto for 3 months.  Further refills patient will follow-up with PCP. Stable.   # Hypocalcemia: 8.5. recommend calcium plus vitamin D.  Recommend Check in with PCP regarding vitamin D checks.   #Since patient is clinically stable I think is reasonable for the patient to follow-up with PCP/can follow-up with [place] as needed.  Patient comfortable with the plan; to call us if any questions or concerns in the interim.  Patient is currently awaiting appointment with PCP-virtual.   # DISPOSITION: print copy of labs today # follow up as needed -Dr.B  Follow-up  instructions:  I discussed the assessment and treatment plan with the patient.  The patient was provided an opportunity to ask questions and all were answered.  The patient agreed with the plan and demonstrated understanding of instructions.  The patient was advised to call back or seek an in person evaluation if the symptoms worsen or if the condition fails to improve as anticipated.   Dr. Charlaine Dalton [facility] at [facility] 05/25/2022 10:00 AM

## 2022-05-25 NOTE — Progress Notes (Signed)
Doing well on the xarelto. No swelling or pain in any extremities.

## 2022-11-30 ENCOUNTER — Ambulatory Visit (INDEPENDENT_AMBULATORY_CARE_PROVIDER_SITE_OTHER): Payer: No Typology Code available for payment source

## 2022-11-30 ENCOUNTER — Encounter: Payer: Self-pay | Admitting: Emergency Medicine

## 2022-11-30 ENCOUNTER — Ambulatory Visit
Admission: EM | Admit: 2022-11-30 | Discharge: 2022-11-30 | Disposition: A | Discharge status: Home / Self Care | Payer: No Typology Code available for payment source | Attending: Emergency Medicine | Admitting: Emergency Medicine

## 2022-11-30 DIAGNOSIS — M79672 Pain in left foot: Secondary | ICD-10-CM | POA: Diagnosis not present

## 2022-11-30 DIAGNOSIS — S99922A Unspecified injury of left foot, initial encounter: Secondary | ICD-10-CM | POA: Diagnosis not present

## 2022-11-30 DIAGNOSIS — S9032XA Contusion of left foot, initial encounter: Secondary | ICD-10-CM | POA: Diagnosis not present

## 2022-11-30 NOTE — ED Provider Notes (Signed)
[facility]    CSN: [id number] Arrival date & time: 11/30/22  0856      History   Chief Complaint Chief Complaint  Patient presents with   Foot Injury    HPI FLORINE SYBERT is a 37 y.o. female with history of PE/DVT on long-term anticoagulation with Xarelto, depression, hyperlipidemia, and obesity.  Patient presents today for pain of the dorsal left foot x 1 week.  She reports dropping a part of a trundle bed onto the foot.  Since then, she has had swelling, bruising of the dorsal foot.  Reports she is able to bear weight but has a lot of difficulty doing so.  This increases the pain.  Also movement of the foot increases the pain.  No numbness, tingling or weakness.  Has not been taking any OTC meds. No improvement or worsening of symptoms. Would like to rule out fracture.  HPI  Past Medical History:  Diagnosis Date   Depression    DVT (deep venous thrombosis) (HCC)    PE (pulmonary embolism)    Protein S deficiency (HCC)     Patient Active Problem List   Diagnosis Date Noted   Previous cesarean delivery, antepartum 10/30/2019   Protein S deficiency (HCC) 12/18/2018   Chronic anticoagulation 12/18/2018   Acute pulmonary embolism (HCC) 04/19/2017   DVT (deep venous thrombosis) (HCC) 04/19/2017   Labor and delivery, indication for care 07/17/2016   Deficiency anemia 05/17/2016   Primary hypercoagulable state (HCC) 01/04/2016   High risk pregnancy, antepartum 12/20/2015   Major depressive disorder, single episode, moderate (HCC) 08/18/2015   Hyperlipidemia 08/12/2015   FH: heart disease 08/12/2015   Postpartum depression 08/12/2015   Vitamin D deficiency 07/14/2015   Obesity, Class II, BMI 35-39.9 07/12/2015   Biliary dyskinesia 07/12/2015   Hx pulmonary embolism 01/27/2015    Past Surgical History:  Procedure Laterality Date   ANKLE SURGERY Left    CESAREAN SECTION N/A 07/17/2016   Procedure: C-Section;  Surgeon: Elenora Fender Ward, MD;  Location: [facility]  ORS;  Service: Obstetrics;  Laterality: N/A;   CHOLECYSTECTOMY N/A 04/23/2017   Procedure: LAPAROSCOPIC CHOLECYSTECTOMY;  Surgeon: Ancil Linsey, MD;  Location: [facility] ORS;  Service: General;  Laterality: N/A;    OB History     Gravida  3   Para  3   Term  3   Preterm      AB      Living  3      SAB      IAB      Ectopic      Multiple  0   Live Births  2            Home Medications    Prior to Admission medications   Medication Sig Start Date End Date Taking? Authorizing Provider  cholecalciferol (VITAMIN D3) 25 MCG (1000 UT) tablet Take 2,000 Units by mouth daily.    Yes [provider]  escitalopram (LEXAPRO) 20 MG tablet Take 1 tablet by mouth daily. 11/13/18  Yes [provider]  rivaroxaban (XARELTO) 20 MG TABS tablet Take 1 tablet (20 mg total) by mouth daily with supper. 05/25/22  Yes Earna Coder, MD    Family History Family History  Problem Relation Age of Onset   Hyperlipidemia Mother    Depression Mother    Heart attack Father    Heart disease Father    Deep vein thrombosis Brother    Diabetes Maternal Grandfather    Bladder  Cancer Maternal Grandfather     Social History Social History   Tobacco Use   Smoking status: Never   Smokeless tobacco: Never  Vaping Use   Vaping status: Never Used  Substance Use Topics   Alcohol use: Not Currently    Alcohol/week: 1.0 standard drink of alcohol    Types: 1 Cans of beer per week    Comment: rarely   Drug use: No     Allergies   Patient has no known allergies.   Review of Systems Review of Systems  Musculoskeletal:  Positive for arthralgias and joint swelling.  Skin:  Positive for color change. Negative for wound.  Neurological:  Negative for weakness and numbness.     Physical Exam Triage Vital Signs ED Triage Vitals  Encounter Vitals Group     BP      Systolic BP Percentile      Diastolic BP Percentile      Pulse      Resp      Temp      Temp  src      SpO2      Weight      Height      Head Circumference      Peak Flow      Pain Score      Pain Loc      Pain Education      Exclude from Growth Chart    No data found.  Updated Vital Signs BP 117/79 (BP Location: Right Arm)   Pulse 88   Temp 98.9 F (37.2 C) (Oral)   Resp 16   LMP 11/23/2022   SpO2 97%      Physical Exam Vitals and nursing note reviewed.  Constitutional:      General: She is not in acute distress.    Appearance: Normal appearance. She is not ill-appearing or toxic-appearing.  HENT:     Head: Normocephalic and atraumatic.  Eyes:     General: No scleral icterus.       Right eye: No discharge.        Left eye: No discharge.     Conjunctiva/sclera: Conjunctivae normal.  Cardiovascular:     Rate and Rhythm: Normal rate and regular rhythm.     Pulses: Normal pulses.  Pulmonary:     Effort: Pulmonary effort is normal. No respiratory distress.  Musculoskeletal:     Cervical back: Neck supple.     Left foot: Normal range of motion. Swelling (mild swelling dorsal midfoot with ecchymosis) and tenderness (TTP along metatarsals) present. Normal pulse.  Skin:    General: Skin is dry.  Neurological:     General: No focal deficit present.     Mental Status: She is alert. Mental status is at baseline.     Motor: No weakness.     Gait: Gait normal.  Psychiatric:        Mood and Affect: Mood normal.        Behavior: Behavior normal.        Thought Content: Thought content normal.      UC Treatments / Results  Labs (all labs ordered are listed, but only abnormal results are displayed) Labs Reviewed - No data to display  EKG   Radiology DG Foot Complete Left  Result Date: 11/30/2022 CLINICAL DATA:  Trauma, pain and swelling EXAM: LEFT FOOT - COMPLETE 3+ VIEW COMPARISON:  None FINDINGS: No displaced fracture or dislocation is seen. There are no opaque foreign bodies. Soft tissue  swelling over the dorsum. IMPRESSION: No recent fracture or  dislocation is seen in left foot. Electronically Signed   By: Ernie Avena M.D.   On: 11/30/2022 09:40    Procedures Procedures (including critical care time)  Medications Ordered in UC Medications - No data to display  Initial Impression / Assessment and Plan / UC Course  I have reviewed the triage vital signs and the nursing notes.  Pertinent labs & imaging results that were available during my care of the patient were reviewed by me and considered in my medical decision making (see chart for details).   37 year old female presents with left foot pain, swelling and bruising x 1 week after dropping part of a trundle bed on her foot.  Has not applied ice or taken anything for pain relief.  Reports no pain at rest.  Some discomfort when bearing weight but able to do so.  Increased pain with pushing off of foot.  X-ray foot obtained today to rule out fracture.  X-ray shows no acute abnormality.  Discussed with the patient.  Reviewed RICE guidelines, Tylenol for pain, Voltaren gel for swelling and inflammation.  Reviewed return precautions.   Final Clinical Impressions(s) / UC Diagnoses   Final diagnoses:  Contusion of left foot, initial encounter  Injury of left foot, initial encounter     Discharge Instructions      -No fractures. - Ice and elevate extremity.  Consider compression wrap.  Tylenol as needed for pain relief.  May also use topical Voltaren anti-inflammatory gel.  Should get better over the next week.     ED Prescriptions   None    PDMP not reviewed this encounter.   Shirlee Latch, PA-C 11/30/22 1003

## 2022-11-30 NOTE — ED Triage Notes (Addendum)
Dropped bed onto left foot 1 week ago, continues to have pain, swelling, bruising in foot. Denies numbness, tingling, weakness. Full ROM intact in left foot and toes. Pain increased with movement and weightbearing. Pain mostly isolated across all of the distal metatarsals near the joints of the toes. Not taking any OTC meds to help. LMP 1 week prior, denies possibility of pregnancy

## 2022-11-30 NOTE — Discharge Instructions (Signed)
-  No fractures. - Ice and elevate extremity.  Consider compression wrap.  Tylenol as needed for pain relief.  May also use topical Voltaren anti-inflammatory gel.  Should get better over the next week.

## 2023-05-23 LAB — CBC AND DIFFERENTIAL
HCT: 38 (ref 36–46)
Hemoglobin: 12.6 (ref 12.0–16.0)
WBC: 5.9

## 2023-05-23 LAB — LIPID PANEL
Cholesterol: 189 (ref 0–200)
HDL: 44 (ref 35–70)
LDL Cholesterol: 117
Triglycerides: 167 — AB (ref 40–160)

## 2023-05-23 LAB — BASIC METABOLIC PANEL WITH GFR
BUN: 10 (ref 4–21)
CO2: 26 — AB (ref 13–22)
Chloride: 105 (ref 99–108)
Creatinine: 0.8 (ref 0.5–1.1)
Glucose: 102
Potassium: 3.9 meq/L (ref 3.5–5.1)
Sodium: 141 (ref 137–147)

## 2023-05-23 LAB — HEMOGLOBIN A1C: Hemoglobin A1C: 5.4

## 2023-05-23 LAB — HEPATIC FUNCTION PANEL
ALT: 15 U/L (ref 7–35)
AST: 14 (ref 13–35)
Bilirubin, Total: 0.3

## 2023-05-23 LAB — COMPREHENSIVE METABOLIC PANEL WITH GFR
Albumin: 4.3 (ref 3.5–5.0)
Calcium: 9.3 (ref 8.7–10.7)

## 2023-05-23 LAB — CBC: RBC: 4.34 (ref 3.87–5.11)

## 2023-05-23 LAB — TSH: TSH: 2.12 (ref 0.41–5.90)

## 2023-07-30 DIAGNOSIS — L700 Acne vulgaris: Secondary | ICD-10-CM | POA: Diagnosis not present

## 2023-07-30 DIAGNOSIS — L814 Other melanin hyperpigmentation: Secondary | ICD-10-CM | POA: Diagnosis not present

## 2023-07-30 DIAGNOSIS — L74519 Primary focal hyperhidrosis, unspecified: Secondary | ICD-10-CM | POA: Diagnosis not present

## 2023-08-27 DIAGNOSIS — F419 Anxiety disorder, unspecified: Secondary | ICD-10-CM | POA: Diagnosis not present

## 2023-08-27 DIAGNOSIS — F900 Attention-deficit hyperactivity disorder, predominantly inattentive type: Secondary | ICD-10-CM | POA: Diagnosis not present

## 2023-09-05 ENCOUNTER — Ambulatory Visit (INDEPENDENT_AMBULATORY_CARE_PROVIDER_SITE_OTHER): Admitting: Student

## 2023-09-05 ENCOUNTER — Encounter: Payer: Self-pay | Admitting: Student

## 2023-09-05 VITALS — BP 116/74 | HR 87 | Temp 98.3°F | Ht 65.0 in | Wt 262.2 lb

## 2023-09-05 DIAGNOSIS — D6859 Other primary thrombophilia: Secondary | ICD-10-CM

## 2023-09-05 DIAGNOSIS — E782 Mixed hyperlipidemia: Secondary | ICD-10-CM

## 2023-09-05 DIAGNOSIS — G4733 Obstructive sleep apnea (adult) (pediatric): Secondary | ICD-10-CM | POA: Insufficient documentation

## 2023-09-05 DIAGNOSIS — F331 Major depressive disorder, recurrent, moderate: Secondary | ICD-10-CM | POA: Diagnosis not present

## 2023-09-05 MED ORDER — CITALOPRAM HYDROBROMIDE 20 MG PO TABS: 20.0000 mg | ORAL_TABLET | Freq: Every day | ORAL | 3 refills | Status: DC

## 2023-09-05 NOTE — Assessment & Plan Note (Signed)
 Currently on lexapro  20 mg twice daily. PHQ9 remains elevated on this regimen. She does see a Veterinary surgeon for personal and family therapy. Would like to try citalopram. Will haver he stop lexapro  and start citalopram 20 mg daily.  She is concerned about ADHD often feels she hyper fixates on tasks and this has causes difficulty with social relationships. Referral made to psychiatry and psychology for ADHD evaluation.

## 2023-09-05 NOTE — Assessment & Plan Note (Signed)
 Currently on zepbound, has just pick up 7.5 mg dose from prior provider. Also taking this for OSA as well. Reports 12 lb weigh loss in the last 3 months. She is sedentary outside of activity related to taking care of 3 young children. Diet is mostly protein shakes, and small lunch her husband prepares. Discussed increasing physical activity, she would like to start doing jujitsu after she looses more weight, encouraged her to pursue a regular activity she would enjoy until she starts this.

## 2023-09-05 NOTE — Assessment & Plan Note (Addendum)
 History of PE and DVT. Currently asymptomatic on Xarelto  20 mg daily which we will continue. Stable hemoglobin on labs in January.

## 2023-09-05 NOTE — Assessment & Plan Note (Signed)
 Discussed healthy diet and exercise, discussed replacing red meats with leaner proteins.

## 2023-09-05 NOTE — Assessment & Plan Note (Signed)
 Recent outside study with mild OSA. I have asked her to upload results with myChart. She is interested in CPAP. [facility] referral sent to CPAP titration. Also currently on Zepbound previously on 6.8 mg weekly through compounding pharmacy. Reports prior provider started on 7.5 mg weekly for OSA which insurance approved.

## 2023-09-05 NOTE — Progress Notes (Signed)
 New Patient Office Visit  Subjective    Patient ID: CHLO… BLEAK, female    DOB: 02/10/1986  Age: 38 y.o. MRN: [id number]  CC:  Chief Complaint  Patient presents with   Establish Care    Patient stated she has been using telehealth providers and wants to establish with a local PCP, requesting to have Lexapro  dose change, has been told she is on a high dose needs a lower dose    Referral    ADHD testing/referral    Sleep Apnea    HPI Michelle Hogan 38 year old person presents to establish care. Was seeing telehealth providers for the past few years.   Depression Has feeling of self loathing, loss of interest, poor appetite, and difficulty sleeping.  Currently on Lexapro  20 mg twice a day  Reports lexapro  20 mg daily was not helping enough and increased dose is helping more.  Recently starting seeing a therapist 1 mont ago. Denies SI, HI, AVD.   OSA Mild sleep study completed last week and talked with sleep expert recommended possibly starting CPAP. She has been on compounded zepbound for the past 3 months. Recently approved for OSA. Would like to have CPAP set up. Does report snoring at night, daytime sleepiness, and frequent napping.    ADHD Would like to be evaluated for this Had friend who saw a psychologist in [place] about this  Hyper focused on repetitive tasks, feels this takes away from time from family and other responsibilities.    Outpatient Encounter Medications as of 09/05/2023  Medication Sig   citalopram (CELEXA) 20 MG tablet Take 1 tablet (20 mg total) by mouth daily.   rivaroxaban  (XARELTO ) 20 MG TABS tablet Take 1 tablet (20 mg total) by mouth daily with supper.   spironolactone (ALDACTONE) 100 MG tablet Take 100 mg by mouth daily.   tretinoin (RETIN-A) 0.025 % cream Apply topically at bedtime.   ZEPBOUND 7.5 MG/0.5ML Pen Inject 7.5 mg into the skin once a week.   [DISCONTINUED] escitalopram  (LEXAPRO ) 20 MG tablet Take 1 tablet by mouth daily.    [DISCONTINUED] cholecalciferol (VITAMIN D3) 25 MCG (1000 UT) tablet Take 2,000 Units by mouth daily.  (Patient not taking: Reported on 09/05/2023)   [DISCONTINUED] ZEPBOUND 10 MG/0.5ML Pen Inject 10 mg into the skin once a week. (Patient not taking: Reported on 09/05/2023)   No facility-administered encounter medications on file as of 09/05/2023.    Past Medical History:  Diagnosis Date   Depression    DVT (deep venous thrombosis) (HCC)    PE (pulmonary embolism)    Protein S deficiency (HCC)     Past Surgical History:  Procedure Laterality Date   ANKLE SURGERY Left    CESAREAN SECTION N/A 07/17/2016   Procedure: C-Section;  Surgeon: Margarie Shay Ward, MD;  Location: [facility] ORS;  Service: Obstetrics;  Laterality: N/A;   CHOLECYSTECTOMY N/A 04/23/2017   Procedure: LAPAROSCOPIC CHOLECYSTECTOMY;  Surgeon: Franki Isles, MD;  Location: [facility] OR[facility];  Service: General;  Laterality: N/A;    Family History  Problem Relation Age of Onset   Hyperlipidemia Mother    Depression Mother    Heart attack Father    Heart disease Father    Deep vein thrombosis Brother    Diabetes Maternal Grandfather    Bladder Cancer Maternal Grandfather     Social History   Socioeconomic History   Marital status: Married    Spouse name: [name]   Number of children: Not on file  Years of education: Not on file   Highest education level: Not on file  Occupational History   Not on file  Tobacco Use   Smoking status: Never   Smokeless tobacco: Never  Vaping Use   Vaping status: Never Used  Substance and Sexual Activity   Alcohol use: Not Currently    Alcohol/week: 1.0 standard drink of alcohol    Types: 1 Cans of beer per week    Comment: rarely   Drug use: No   Sexual activity: Yes    Birth control/protection: I.U.D.  Other Topics Concern   Not on file  Social History Narrative   Not on file   Social Drivers of Health   Financial Resource Strain: Not on file  Food Insecurity: No Food Insecurity  (09/05/2023)   Hunger Vital Sign    Worried About Running Out of Food in the Last Year: Never true    Ran Out of Food in the Last Year: Never true  Transportation Needs: No Transportation Needs (09/05/2023)   PRAPARE - Administrator, Civil Service (Medical): No    Lack of Transportation (Non-Medical): No  Physical Activity: Not on file  Stress: Not on file  Social Connections: Not on file  Intimate Partner Violence: Not At Risk (09/05/2023)   Humiliation, Afraid, Rape, and Kick questionnaire    Fear of Current or Ex-Partner: No    Emotionally Abused: No    Physically Abused: No    Sexually Abused: No    ROS Refer to HPI    Objective   BP 116/74   Pulse 87   Temp 98.3 F (36.8 C)   Ht 5\' 5"  (1.651 m)   Wt 262 lb 4 oz (119 kg)   LMP 08/06/2023 (Approximate)   SpO2 97%   BMI 43.64 kg/m   Physical Exam Constitutional:      Appearance: Normal appearance.  Cardiovascular:     Rate and Rhythm: Normal rate and regular rhythm.  Pulmonary:     Effort: Pulmonary effort is normal.     Breath sounds: No rhonchi or rales.  Abdominal:     General: Abdomen is flat. Bowel sounds are normal. There is no distension.     Palpations: Abdomen is soft.     Tenderness: There is no abdominal tenderness.  Musculoskeletal:        General: Normal range of motion.     Right lower leg: No edema.     Left lower leg: No edema.  Skin:    General: Skin is warm and dry.     Capillary Refill: Capillary refill takes less than 2 seconds.  Neurological:     General: No focal deficit present.     Mental Status: She is alert and oriented to person, place, and time.  Psychiatric:        Mood and Affect: Mood normal.        Behavior: Behavior normal.        09/05/2023   10:03 AM 09/05/2023    9:55 AM 07/12/2015   10:16 AM  Depression screen PHQ 2/9  Decreased Interest 2 2 3   Down, Depressed, Hopeless 2 2 2   PHQ - 2 Score 4 4 5   Altered sleeping 3 3 0  Tired, decreased energy 3 3 2    Change in appetite 1 1 1   Feeling bad or failure about yourself  2 2 3   Trouble concentrating 3 3 1   Moving slowly or fidgety/restless 0  0  Suicidal  thoughts 0  1  PHQ-9 Score 16 16 13   Difficult doing work/chores Somewhat difficult  Not difficult at all      09/05/2023   10:03 AM 07/12/2015   10:20 AM  GAD 7 : Generalized Anxiety Score  Nervous, Anxious, on Edge 2 1  Control/stop worrying 1 1  Worry too much - different things 2 1  Trouble relaxing 1 0  Restless 0 0  Easily annoyed or irritable 2 2  Afraid - awful might happen 0 0  Total GAD 7 Score 8 5  Anxiety Difficulty Somewhat difficult Somewhat difficult    Last CBC Lab Results  Component Value Date   WBC 5.9 05/23/2023   HGB 12.6 05/23/2023   HCT 38 05/23/2023   MCV 85.2 05/25/2022   MCH 28.5 05/25/2022   RDW 13.4 05/25/2022   PLT 276 05/25/2022   Last metabolic panel Lab Results  Component Value Date   GLUCOSE 99 05/25/2022   NA 141 05/23/2023   K 3.9 05/23/2023   CL 105 05/23/2023   CO2 26 (A) 05/23/2023   BUN 10 05/23/2023   CREATININE 0.8 05/23/2023   GFRNONAA >60 05/25/2022   CALCIUM 9.3 05/23/2023   PROT 7.6 11/02/2020   ALBUMIN 4.3 05/23/2023   LABGLOB 2.1 07/13/2015   AGRATIO 2.4 (H) 07/13/2015   BILITOT 0.5 11/02/2020   ALKPHOS 85 11/02/2020   AST 14 05/23/2023   ALT 15 05/23/2023   ANIONGAP 9 05/25/2022   Last lipids Lab Results  Component Value Date   CHOL 189 05/23/2023   HDL 44 05/23/2023   LDLCALC 117 05/23/2023   TRIG 167 (A) 05/23/2023   CHOLHDL 5.4 (H) 07/13/2015   Last hemoglobin A1c Lab Results  Component Value Date   HGBA1C 5.4 05/23/2023    Assessment & Plan:  Moderate episode of recurrent major depressive disorder (HCC) Assessment & Plan: Currently on lexapro  20 mg twice daily. PHQ9 remains elevated on this regimen. She does see a Veterinary surgeon for personal and family therapy. Would like to try citalopram. Will haver he stop lexapro  and start citalopram 20 mg daily.   She is concerned about ADHD often feels she hyper fixates on tasks and this has causes difficulty with social relationships. Referral made to psychiatry and psychology for ADHD evaluation.   Orders: -     Ambulatory referral to Psychology -     Ambulatory referral to Psychiatry  Protein S deficiency [facility]) Assessment & Plan: History of PE and DVT. Currently asymptomatic on Xarelto  20 mg daily which we will continue. Stable hemoglobin on labs in January.    Morbid obesity (HCC) Assessment & Plan: Currently on zepbound, has just pick up 7.5 mg dose from prior provider. Also taking this for OSA as well. Reports 12 lb weigh loss in the last 3 months. She is sedentary outside of activity related to taking care of 3 young children. Diet is mostly protein shakes, and small lunch her husband prepares. Discussed increasing physical activity, she would like to start doing jujitsu after she looses more weight, encouraged her to pursue a regular activity she would enjoy until she starts this.    OSA (obstructive sleep apnea) Assessment & Plan: Recent outside study with mild OSA. I have asked her to upload results with myChart. She is interested in CPAP. [facility] referral sent to CPAP titration. Also currently on Zepbound previously on 6.8 mg weekly through compounding pharmacy. Reports prior provider started on 7.5 mg weekly for OSA which insurance approved.  Moderate mixed hyperlipidemia not requiring statin therapy Assessment & Plan: Discussed healthy diet and exercise, discussed replacing red meats with leaner proteins.    Other orders -     Citalopram Hydrobromide; Take 1 tablet (20 mg total) by mouth daily.  Dispense: 30 tablet; Refill: 3    Return in about 1 month (around 10/06/2023) for Depression, weight .   Barnetta Liberty, MD

## 2023-09-12 DIAGNOSIS — F419 Anxiety disorder, unspecified: Secondary | ICD-10-CM | POA: Diagnosis not present

## 2023-09-12 DIAGNOSIS — Z124 Encounter for screening for malignant neoplasm of cervix: Secondary | ICD-10-CM | POA: Diagnosis not present

## 2023-09-12 DIAGNOSIS — N9089 Other specified noninflammatory disorders of vulva and perineum: Secondary | ICD-10-CM | POA: Diagnosis not present

## 2023-09-12 DIAGNOSIS — Z1331 Encounter for screening for depression: Secondary | ICD-10-CM | POA: Diagnosis not present

## 2023-09-12 DIAGNOSIS — F900 Attention-deficit hyperactivity disorder, predominantly inattentive type: Secondary | ICD-10-CM | POA: Diagnosis not present

## 2023-09-12 DIAGNOSIS — Z01411 Encounter for gynecological examination (general) (routine) with abnormal findings: Secondary | ICD-10-CM | POA: Diagnosis not present

## 2023-09-12 LAB — HM PAP SMEAR

## 2023-10-09 ENCOUNTER — Ambulatory Visit: Admitting: Student

## 2023-10-09 ENCOUNTER — Encounter: Payer: Self-pay | Admitting: Student

## 2023-10-09 DIAGNOSIS — E559 Vitamin D deficiency, unspecified: Secondary | ICD-10-CM | POA: Diagnosis not present

## 2023-10-09 DIAGNOSIS — F331 Major depressive disorder, recurrent, moderate: Secondary | ICD-10-CM

## 2023-10-09 DIAGNOSIS — G4733 Obstructive sleep apnea (adult) (pediatric): Secondary | ICD-10-CM | POA: Diagnosis not present

## 2023-10-09 DIAGNOSIS — Z7901 Long term (current) use of anticoagulants: Secondary | ICD-10-CM | POA: Diagnosis not present

## 2023-10-09 DIAGNOSIS — L700 Acne vulgaris: Secondary | ICD-10-CM | POA: Insufficient documentation

## 2023-10-09 DIAGNOSIS — R197 Diarrhea, unspecified: Secondary | ICD-10-CM | POA: Insufficient documentation

## 2023-10-09 NOTE — Assessment & Plan Note (Signed)
 Previously on supplementation several years ago but not taking anything currently. Vitamin D  today.

## 2023-10-09 NOTE — Assessment & Plan Note (Signed)
 PHQ9 is 13 today since switching to citalopram . Tolerating it well without significant side effects. Continuing with therapy which she finds helpful. Discussed increase citalopram  as she seems to be having partial response. She would like to stay on the same dose at this time. She will call to schedule psychiatry visit to discuss ADHD.

## 2023-10-09 NOTE — Assessment & Plan Note (Signed)
 Doing well on xarelto , denies significant bleeding. Denies LE edema, shortness of breath, racing heart, or LOC. No signs of VTE on exam today. CBC and CMP today.

## 2023-10-09 NOTE — Assessment & Plan Note (Addendum)
 Has consultation with [facility] regarding sleep study next week.

## 2023-10-09 NOTE — Progress Notes (Signed)
 Established Patient Office Visit  Subjective   Patient ID: Michelle Hogan, female    DOB: 1985/11/26  Age: 38 y.o. MRN: [id number]  Chief Complaint  Patient presents with   Depression   Weight Check   Some loose stools for the last 5 days, having 2-3 BMs daily. Denies fevers, chills, abdominal pain, nausea, vomiting, hematochezia. Does not think she has eaten any strange food. Was taking OTC stool softeners.   Feeling more anxious due to politics and concern for husband's safety due to work travel to areas with protests. Anxiety improved since then. Mood is worse when having interpersonal issues with family. Feels she is having fewer sexual side effects. Think ADHD symptoms are contributing. Doing therapy every other week alternating couple and individual. Denies SI, HI, AVD.  Patient Active Problem List   Diagnosis Date Noted   Diarrhea 10/09/2023   Acne vulgaris 10/09/2023   Morbid obesity (HCC) 09/05/2023   OSA (obstructive sleep apnea) 09/05/2023   Moderate episode of recurrent major depressive disorder (HCC) 09/05/2023   Protein S deficiency (HCC) 12/18/2018   Chronic anticoagulation 12/18/2018   History of DVT (deep vein thrombosis) 04/19/2017   Hyperlipidemia 08/12/2015   FH: heart disease 08/12/2015   Vitamin D  deficiency 07/14/2015   Hx pulmonary embolism 01/27/2015      ROS Refer to HPI    Objective:     BP 126/82   Pulse 88   Ht 5' 5 (1.651 m)   Wt 252 lb 6 oz (114.5 kg)   SpO2 96%   BMI 42.00 kg/m  BP Readings from Last 3 Encounters:  10/09/23 126/82  09/05/23 116/74  11/30/22 117/79    Physical Exam Constitutional:      Appearance: Normal appearance.  HENT:     Head: Normocephalic and atraumatic.     Mouth/Throat:     Mouth: Mucous membranes are moist.     Pharynx: Oropharynx is clear.  Neck:     Comments: No thyromegaly Cardiovascular:     Rate and Rhythm: Normal rate and regular rhythm.  Pulmonary:     Effort: Pulmonary effort is  normal.     Breath sounds: No rhonchi or rales.  Abdominal:     General: Abdomen is flat. Bowel sounds are normal. There is no distension.     Palpations: Abdomen is soft.     Tenderness: There is no abdominal tenderness. There is no guarding or rebound.     Hernia: No hernia is present.   Musculoskeletal:        General: Normal range of motion.     Right lower leg: No edema.     Left lower leg: No edema.   Skin:    General: Skin is warm and dry.     Capillary Refill: Capillary refill takes less than 2 seconds.   Neurological:     General: No focal deficit present.     Mental Status: She is alert and oriented to person, place, and time.   Psychiatric:        Mood and Affect: Mood normal.        Behavior: Behavior normal.        10/09/2023   10:12 AM 09/05/2023   10:03 AM 09/05/2023    9:55 AM  Depression screen PHQ 2/9  Decreased Interest 1 2 2   Down, Depressed, Hopeless 1 2 2   PHQ - 2 Score 2 4 4   Altered sleeping 3 3 3   Tired, decreased energy 3 3 3  Change in appetite 2 1 1   Feeling bad or failure about yourself  1 2 2   Trouble concentrating 2 3 3   Moving slowly or fidgety/restless 0 0   Suicidal thoughts 0 0   PHQ-9 Score 13 16 16   Difficult doing work/chores Somewhat difficult Somewhat difficult        10/09/2023   10:13 AM 10/09/2023   10:12 AM 09/05/2023   10:03 AM 07/12/2015   10:20 AM  GAD 7 : Generalized Anxiety Score  Nervous, Anxious, on Edge 3 1 2 1   Control/stop worrying 2 1 1 1   Worry too much - different things 1 3 2 1   Trouble relaxing 0 3 1 0  Restless 0 1 0 0  Easily annoyed or irritable 0  2 2  Afraid - awful might happen 2  0 0  Total GAD 7 Score 8  8 5   Anxiety Difficulty Not difficult at all  Somewhat difficult Somewhat difficult    No results found for any visits on 10/09/23.  Last CBC Lab Results  Component Value Date   WBC 5.9 05/23/2023   HGB 12.6 05/23/2023   HCT 38 05/23/2023   MCV 85.2 05/25/2022   MCH 28.5 05/25/2022    RDW 13.4 05/25/2022   PLT 276 05/25/2022   Last metabolic panel Lab Results  Component Value Date   GLUCOSE 99 05/25/2022   NA 141 05/23/2023   K 3.9 05/23/2023   CL 105 05/23/2023   CO2 26 (A) 05/23/2023   BUN 10 05/23/2023   CREATININE 0.8 05/23/2023   GFRNONAA >60 05/25/2022   CALCIUM 9.3 05/23/2023   PROT 7.6 11/02/2020   ALBUMIN 4.3 05/23/2023   LABGLOB 2.1 07/13/2015   AGRATIO 2.4 (H) 07/13/2015   BILITOT 0.5 11/02/2020   ALKPHOS 85 11/02/2020   AST 14 05/23/2023   ALT 15 05/23/2023   ANIONGAP 9 05/25/2022   Last lipids Lab Results  Component Value Date   CHOL 189 05/23/2023   HDL 44 05/23/2023   LDLCALC 117 05/23/2023   TRIG 167 (A) 05/23/2023   CHOLHDL 5.4 (H) 07/13/2015   Last hemoglobin A1c Lab Results  Component Value Date   HGBA1C 5.4 05/23/2023   Last thyroid functions Lab Results  Component Value Date   TSH 2.12 05/23/2023      The ASCVD Risk score (Arnett DK, et al., 2019) failed to calculate for the following reasons:   The 2019 ASCVD risk score is only valid for ages 17 to 61    Assessment & Plan:  Morbid obesity (HCC) Assessment & Plan: Weight is down 10 pounds since starting zepbound 7.5mg  weekly. Think she is losing ~1 pound or less weekly. Having some bleching but otherwise tolerating well. Will increase to 10 mg weekly.    OSA (obstructive sleep apnea) Assessment & Plan: Has consultation with [facility] regarding sleep study next week.    Vitamin D  deficiency Assessment & Plan: Previously on supplementation several years ago but not taking anything currently. Vitamin D  today.   Chronic anticoagulation Assessment & Plan: Doing well on xarelto , denies significant bleeding. Denies LE edema, shortness of breath, racing heart, or LOC. No signs of VTE on exam today. CBC and CMP today.    Acne vulgaris Assessment & Plan: Sees dermatology and on spironolactone for the last 6 months. Will check CMP today.   Moderate episode of  recurrent major depressive disorder [facility]) Assessment & Plan: PHQ9 is 13 today since switching to citalopram . Tolerating it well without significant  side effects. Continuing with therapy which she finds helpful. Discussed increase citalopram  as she seems to be having partial response. She would like to stay on the same dose at this time. She will call to schedule psychiatry visit to discuss ADHD.    Diarrhea of presumed infectious origin Assessment & Plan: Diarrhea for the past 5 days. Well appearing on exam today and tolerating PO intake. May be related to stool softener use vs viral gastroenteritis. Symptoms are gradually improving. Unlikely to be due to zepbound given she has been on this dose for the last 6 weeks without issue. Check CBC and CMP today. Discussed postponing next dose of zepbound if symptoms not improved by end of the week.        Return in about 3 months (around 01/09/2024) for weight.    Barnetta Liberty, MD

## 2023-10-09 NOTE — Assessment & Plan Note (Signed)
 Sees dermatology and on spironolactone for the last 6 months. Will check CMP today.

## 2023-10-09 NOTE — Assessment & Plan Note (Addendum)
 Weight is down 10 pounds since starting zepbound 7.5mg  weekly. Think she is losing ~1 pound or less weekly. Having some bleching but otherwise tolerating well. Will increase to 10 mg weekly.

## 2023-10-09 NOTE — Assessment & Plan Note (Signed)
 Diarrhea for the past 5 days. Well appearing on exam today and tolerating PO intake. May be related to stool softener use vs viral gastroenteritis. Symptoms are gradually improving. Unlikely to be due to zepbound given she has been on this dose for the last 6 weeks without issue. Check CBC and CMP today. Discussed postponing next dose of zepbound if symptoms not improved by end of the week.

## 2023-10-11 DIAGNOSIS — F900 Attention-deficit hyperactivity disorder, predominantly inattentive type: Secondary | ICD-10-CM | POA: Diagnosis not present

## 2023-10-11 DIAGNOSIS — F419 Anxiety disorder, unspecified: Secondary | ICD-10-CM | POA: Diagnosis not present

## 2023-10-18 ENCOUNTER — Ambulatory Visit: Payer: Self-pay | Admitting: Psychiatry

## 2023-10-18 DIAGNOSIS — F331 Major depressive disorder, recurrent, moderate: Secondary | ICD-10-CM | POA: Diagnosis not present

## 2023-10-18 DIAGNOSIS — F909 Attention-deficit hyperactivity disorder, unspecified type: Secondary | ICD-10-CM | POA: Diagnosis not present

## 2023-10-18 MED ORDER — ATOMOXETINE HCL 25 MG PO CAPS
25.0000 mg | ORAL_CAPSULE | Freq: Every day | ORAL | 0 refills | Status: DC
Start: 2023-10-18 — End: 2024-01-09

## 2023-10-18 NOTE — Progress Notes (Addendum)
 Psychiatric Initial Adult Assessment   Patient Identification: Michelle Hogan MRN:  [id number] Date of Evaluation:  10/18/2023 Referral Source: Harlene Saddler [facility] follow-uD Chief Complaint: Establishing care Visit Diagnosis:    ICD-10-CM   1. Attention deficit hyperactivity disorder (ADHD), unspecified ADHD type  F90.9     2. Moderate episode of recurrent major depressive disorder [facility])  F33.1      Virtual Visit via Video Note  I connected with Michelle Hogan on 10/18/23 at 10:00 AM EDT by a video enabled telemedicine application and verified that I am speaking with the correct person using two identifiers.  Location: Patient: [address]  [place] [id number]  Provider: [facility] Office of Provider   I discussed the limitations of evaluation and management by telemedicine and the availability of in person appointments. The patient expressed understanding and agreed to proceed.    I discussed the assessment and treatment plan with the patient. The patient was provided an opportunity to ask questions and all were answered. The patient agreed with the plan and demonstrated an understanding of the instructions.   The patient was advised to call back or seek an in-person evaluation if the symptoms worsen or if the condition fails to improve as anticipated.  I provided 60 minutes of non-face-to-face time during this encounter.   Michelle Jama Der, NP   History of Present Illness: A 38 year old female presenting to [facility] for establishing care.  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  Patient reports that she currently has a primary doctor who is helping manage her depression with Celexa  50 mg once daily.  She  was referred to us  for ADHD management and she was explained to the [facility] requires for patients to be considered for stimulants must go through neuropsychiatric testing that the patient completed by a psychologist or a neuropsychiatrist.  Patient verbalized understanding his stated that she would be interested in which referral was sent to look our clinic.  Patient also reported that she is having symptoms of inattentiveness, poor focus, impulsiveness, and restlessness.  Patient reports this has been consistent throughout her high school and college years as she stated that she was did well in class but always feel her concentration was difficult and proved to be difficult while trying to finish up college.  Patient reports that sleeping has been an issue stating that she tries to stay awake after everyone is going to sleep in the house so she could take care of herself which would cause her to stay until 2 AM and then have to be back up by 6 AM.  Only getting 4 to 6 hours of sleep at night.  Patient does report that she has had suicidal thoughts in the past in her 30s in which it was related to her postpartum depression that she experienced in the last child with the last episode of suicidal ideation without intent 6 months ago.  Based on this assessment interview is recommended for the patient be diagnosed with ADHD unspecified as well as moderate, major depressive disorder, recurrent.  Patient will start on atomoxetine 25 mg once daily patient has been educated at the sexual dysfunction as well as the initial  irritability and jitteriness with the medication as this is a neuromodulator apples and norepinephrine.  Patient has been educated to take the 25 mg dosage to abort the medications so that she can avoid side effects.  Patient agreed understanding and states that she will call or send my card messaging should she experience any side effects that not tolerable.  Patient also reports that  she will call 911 or go to emergency department if she has any suicidal thoughts to verbalize understanding of medication purpose as well as how to contact either.  Patient is agreement with treatment plan.  Patient with no other questions or concerns.  Patient will follow up in 2 weeks to monitor side effects as well as medication effectiveness.  Referral has been sent to [facility] clinic for ADHD evaluation.  Associated Signs/Symptoms: Depression Symptoms:  difficulty concentrating, (Hypo) Manic Symptoms:  Distractibility, Anxiety Symptoms: Negative Psychotic Symptoms: Negative PTSD Symptoms: Negative  Past Psychiatric History:  Previous Psych Hospitalizations: -Denies Outpatient treatment:  -Current PCP Harlene Saddler, MD Medications Current: - Celexa  20 mg once daily -Atomoxetine 45 mg once daily for ADHD unspecified Next Steps: - Optimize atomoxetine dosage, the patient is waiting for psychiatric testing referrals [facility] Medication Trials: - No medication trials Suicide & Violence: - Last episode of suicidal ideation with passive thoughts reported 6 months ago -Currently denies SI, HI, AVH -Reports that emotional distress with husband usually causes SI thoughts Substance Use: - Denies Psychotherapy: - Currently participating in psychotherapy with Selinda Robichaud LCSW Legal:  - Denies Previous Psychotropic Medications: Yes   Substance Abuse History in the last 12 months:  No.  Consequences of Substance Abuse: Negative  Past Medical History:  Past Medical History:  Diagnosis Date   Depression    DVT (deep venous thrombosis) (HCC)    PE (pulmonary embolism)    Postpartum depression 08/12/2015   Protein S deficiency (HCC)     Past Surgical History:  Procedure Laterality Date   ANKLE SURGERY Left    CESAREAN SECTION N/A 07/17/2016   Procedure: C-Section;  Surgeon: Mitzie BROCKS Ward, MD;  Location: [facility] ORS;  Service: Obstetrics;  Laterality: N/A;   CHOLECYSTECTOMY  N/A 04/23/2017   Procedure: LAPAROSCOPIC CHOLECYSTECTOMY;  Surgeon: Nicholaus Selinda Birmingham, MD;  Location: [facility] ORS;  Service: General;  Laterality: N/A;    Family Psychiatric History: Mother with depression  Family History:  Family History  Problem Relation Age of Onset   Hyperlipidemia Mother    Depression Mother    Heart attack Father    Heart disease Father    Deep vein thrombosis Brother    Diabetes Maternal Grandfather    Bladder Cancer Maternal Grandfather     Social History:   Social History   Socioeconomic History   Marital status: Married    Spouse name: [name]   Number of children: Not on file   Years of education: Not on file   Highest education level: Not on file  Occupational History   Not on file  Tobacco Use   Smoking status: Never   Smokeless tobacco: Never  Vaping Use   Vaping status: Never Used  Substance and Sexual Activity   Alcohol use: Not Currently    Alcohol/week: 1.0 standard drink of alcohol    Types: 1 Cans of beer per week    Comment: rarely   Drug use: No   Sexual activity: Yes    Birth control/protection: I.U.D.  Other Topics Concern   Not on file  Social History Narrative  Not on file   Social Drivers of Health   Financial Resource Strain: Low Risk  (09/12/2023)   Received from [facility]   Overall Financial Resource Strain (CARDIA)    Difficulty of Paying Living Expenses: Not hard at all  Food Insecurity: No Food Insecurity (09/12/2023)   Received from [facility]   Hunger Vital Sign    Within the past 12 months, you worried that your food would run out before you got the money to buy more.: Never true    Within the past 12 months, the food you bought just didn't last and you didn't have money to get more.: Never true  Transportation Needs: No Transportation Needs (09/12/2023)   Received from [facility] - Transportation    In the past 12 months, has lack of  transportation kept you from medical appointments or from getting medications?: No    Lack of Transportation (Non-Medical): No  Physical Activity: Not on file  Stress: Not on file  Social Connections: Not on file    Additional Social History: No additional history  Allergies:  No Known Allergies  Metabolic Disorder Labs: Lab Results  Component Value Date   HGBA1C 5.4 05/23/2023   No results found for: PROLACTIN Lab Results  Component Value Date   CHOL 189 05/23/2023   TRIG 167 (A) 05/23/2023   HDL 44 05/23/2023   CHOLHDL 5.4 (H) 07/13/2015   LDLCALC 117 05/23/2023   LDLCALC 154 (H) 07/13/2015   Lab Results  Component Value Date   TSH 2.12 05/23/2023    Therapeutic Level Labs: No results found for: LITHIUM No results found for: CBMZ No results found for: VALPROATE  Current Medications: Current Outpatient Medications  Medication Sig Dispense Refill   citalopram  (CELEXA ) 20 MG tablet Take 1 tablet (20 mg total) by mouth daily. 30 tablet 3   rivaroxaban  (XARELTO ) 20 MG TABS tablet Take 1 tablet (20 mg total) by mouth daily with supper. 90 tablet 0   spironolactone (ALDACTONE) 100 MG tablet Take 100 mg by mouth daily.     tretinoin (RETIN-A) 0.025 % cream Apply topically at bedtime.     triamcinolone ointment (KENALOG) 0.1 % Apply 1 Application topically 2 (two) times daily.     ZEPBOUND 7.5 MG/0.5ML Pen Inject 7.5 mg into the skin once a week.     No current facility-administered medications for this visit.    Musculoskeletal: Strength & Muscle Tone: within normal limits Gait & Station: normal Patient leans: N/A  Psychiatric Specialty Exam: Review of Systems  Constitutional: Negative.   HENT: Negative.    Eyes: Negative.   Respiratory: Negative.    Cardiovascular: Negative.   Gastrointestinal: Negative.   Endocrine: Negative.   Genitourinary: Negative.   Musculoskeletal: Negative.   Skin: Negative.   Allergic/Immunologic: Negative.    Neurological: Negative.   Hematological: Negative.   Psychiatric/Behavioral:  Positive for decreased concentration.     There were no vitals taken for this visit.There is no height or weight on file to calculate BMI.  General Appearance: Well Groomed  Eye Contact:  Good  Speech:  Clear and Coherent  Volume:  Normal  Mood:  Euthymic  Affect:  Appropriate  Thought Process:  Coherent  Orientation:  Full (Time, Place, and Person)  Thought Content:  Logical  Suicidal Thoughts:  No  Homicidal Thoughts:  No  Memory:  Immediate;   Good Recent;   Good Remote;   Good  Judgement:  Good  Insight:  Good  Psychomotor Activity:  Normal  Concentration:  Concentration: Poor and Attention Span: Poor  Recall:  Good  Fund of Knowledge:Good  Language: Good  Akathisia:  No  Handed:  Right  AIMS (if indicated):    Assets:  Desire for Improvement Financial Resources/Insurance Housing  ADL's:  Intact  Cognition: WNL  Sleep:  Fair   Screenings: GAD-7    Flowsheet Row Office Visit from 10/09/2023 in [facility] at [facility] Office Visit from 09/05/2023 in [facility] at [facility] Office Visit from 07/12/2015 in [facility] & [facility] Medicine at [facility]  Total GAD-7 Score 8 8 5    PHQ2-9    Flowsheet Row Office Visit from 10/09/2023 in [facility] Care & Sports Medicine at [facility] Office Visit from 09/05/2023 in [facility] Medicine at [facility] Office Visit from 07/12/2015 in [facility] Primary [facility] & [facility] at [facility]  PHQ-2 Total Score 2 4 5   PHQ-9 Total Score 13 16 13    Flowsheet Row UC from 11/30/2022 in [facility] at [facility]  ED from 06/20/2021 in [facility] at [facility]  C-SSRS RISK CATEGORY No Risk No Risk    Assessment and Plan:  Assessment - Diagnosis: Attention deficit hyperactivity  disorder (ADHD), unspecified ADHD type [F90.9]  2. Moderate episode of recurrent major depressive disorder (HCC) [F33.1]   - Progress: Baseline appointment - Risk Factors: Worsening symptoms, Suicide risk  Plan - Medications:  Start Atomexetine 25mg  once daily for 2 weeks Continue Celexa  20mg  once daily - Psychotherapy: Currently in therapy with Jason Robichaud. - Education: Patient has been educated on how to contact this provider through my chart messaging or by calling the clinic.  Patient has been educated on medications, purpose, side effects, and adverse reactions.  Patient has been also been educated that if she has depressive symptoms she is advised not to have a firearm at home but she states that the firearm is secured and locked away and ammunition is separated with another locked box. - Follow-Up: Patient will follow up in 2 weeks to monitor side effects and symptom management. - Referrals: No referrals - Safety Planning: The patient has been educated, if they should have suicidal thoughts with or without a plan to call 911, or go to the closest emergency department.  Pt verbalized understanding.  Pt reports having a firearm in the home but states that the gun is locked with a trigger guard lock and the ammunition is separated and another lockbox and both are secured in a locked state..  Pt also agrees to call the clinic should they have worsening symptoms before the next appointment.    Patient/Guardian was advised Release of Information must be obtained prior to any record release in order to collaborate their care with an outside provider. Patient/Guardian was advised if they have not already done so to contact the registration department to sign all necessary forms in order for us  to release information regarding their care.   Consent: Patient/Guardian gives verbal consent for treatment and assignment of benefits for services provided during this visit. Patient/Guardian expressed  understanding and agreed to proceed.   Michelle Jama Der, NP 6/26/20259:56 AM

## 2023-10-19 DIAGNOSIS — E559 Vitamin D deficiency, unspecified: Secondary | ICD-10-CM | POA: Diagnosis not present

## 2023-10-19 DIAGNOSIS — Z7901 Long term (current) use of anticoagulants: Secondary | ICD-10-CM | POA: Diagnosis not present

## 2023-10-19 DIAGNOSIS — L700 Acne vulgaris: Secondary | ICD-10-CM | POA: Diagnosis not present

## 2023-10-20 LAB — VITAMIN D 25 HYDROXY (VIT D DEFICIENCY, FRACTURES): Vit D, 25-Hydroxy: 31.7 ng/mL (ref 30.0–100.0)

## 2023-10-20 LAB — CBC
Hematocrit: 40.4 % (ref 34.0–46.6)
Hemoglobin: 13.1 g/dL (ref 11.1–15.9)
MCH: 28.9 pg (ref 26.6–33.0)
MCHC: 32.4 g/dL (ref 31.5–35.7)
MCV: 89 fL (ref 79–97)
Platelets: 274 10*3/uL (ref 150–450)
RBC: 4.53 x10E6/uL (ref 3.77–5.28)
RDW: 14.2 % (ref 11.7–15.4)
WBC: 7.3 10*3/uL (ref 3.4–10.8)

## 2023-10-20 LAB — COMPREHENSIVE METABOLIC PANEL WITH GFR
ALT: 18 IU/L (ref 0–32)
AST: 17 IU/L (ref 0–40)
Albumin: 4.5 g/dL (ref 3.9–4.9)
Alkaline Phosphatase: 81 IU/L (ref 44–121)
BUN/Creatinine Ratio: 18 (ref 9–23)
BUN: 14 mg/dL (ref 6–20)
Bilirubin Total: 0.3 mg/dL (ref 0.0–1.2)
CO2: 19 mmol/L — ABNORMAL LOW (ref 20–29)
Calcium: 9.4 mg/dL (ref 8.7–10.2)
Chloride: 99 mmol/L (ref 96–106)
Creatinine, Ser: 0.77 mg/dL (ref 0.57–1.00)
Globulin, Total: 2.3 g/dL (ref 1.5–4.5)
Glucose: 84 mg/dL (ref 70–99)
Potassium: 4.2 mmol/L (ref 3.5–5.2)
Sodium: 136 mmol/L (ref 134–144)
Total Protein: 6.8 g/dL (ref 6.0–8.5)
eGFR: 101 mL/min/{1.73_m2} (ref >59)

## 2023-10-22 ENCOUNTER — Ambulatory Visit: Payer: Self-pay | Admitting: Student

## 2023-11-01 ENCOUNTER — Telehealth: Admitting: Psychiatry

## 2023-11-01 DIAGNOSIS — F909 Attention-deficit hyperactivity disorder, unspecified type: Secondary | ICD-10-CM

## 2023-11-01 DIAGNOSIS — F331 Major depressive disorder, recurrent, moderate: Secondary | ICD-10-CM | POA: Diagnosis not present

## 2023-11-01 MED ORDER — LISDEXAMFETAMINE DIMESYLATE 10 MG PO CAPS: 10.0000 mg | ORAL_CAPSULE | Freq: Every day | ORAL | 0 refills | Status: DC

## 2023-11-01 NOTE — Progress Notes (Signed)
 [facility] MD/PA/NP OP Progress Note  11/01/2023 2:31 PM Michelle Hogan  MRN:  [id number]  Chief Complaint: Routine Follow-up Virtual Visit via Video Note  I connected with Michelle Hogan on 11/01/23 at  2:30 PM EDT by a video enabled telemedicine application and verified that I am speaking with the correct person using two identifiers.  Location: Patient: [address]  [place] [address]  Provider: [facility] Office of Provider   I discussed the limitations of evaluation and management by telemedicine and the availability of in person appointments. The patient expressed understanding and agreed to proceed.    I discussed the assessment and treatment plan with the patient. The patient was provided an opportunity to ask questions and all were answered. The patient agreed with the plan and demonstrated an understanding of the instructions.   The patient was advised to call back or seek an in-person evaluation if the symptoms worsen or if the condition fails to improve as anticipated.  I provided 30 minutes of non-face-to-face time during this encounter.   [name], NP    HPI: 38 year old female for followup at [facility]  Pt reports that she is still feeling symptoms of poor concentration and focus. Pt reports that the medication of Atomoxetine  has not really improved the executive functioning that was discussed, only reporting that her sleep has become more difficult. Pt states that she is hoping that another medication would help as she has not seen good improvement.  Pt states her depression is being managed effectively, and Celexa  is doing well for her.  Pt was asked about Wellbutrin in which she reports that it was considered in the past but it did not go well because it actually increased her cravings, which she states is not appropriate at this time due to her being on Zepbound.  Pt has been educated on stimulants, and pt has already been referred to [facility] for  psychological testing for ADHD.  Pt also is in agreement with getting UDS done every 3 months until testing is complete.  Pt has been educated on Vyvanse  10mg  once daily, and has been educated of the benefits of appetite suppressant in which she is in agreement with.  Pt does report that she will be going to [facility] this weekend and will wait til she returns to start the medication.  Pt will be provided 2 week supply and reassessed. Pt is in agreement with treatment plan.  Pt with no other questions or concerns. Pt denies SI/HI/AVH.  Pt will follow-up in 2 weeks.    I personally spent a total of 30 minutes in the care of the patient today including preparing to see the patient, getting/reviewing separately obtained history, performing a medically appropriate exam/evaluation, counseling and educating, placing orders, and documenting clinical information in the EHR.   Visit Diagnosis:    ICD-10-CM   1. Attention deficit hyperactivity disorder (ADHD), unspecified ADHD type  F90.9     2. Moderate episode of recurrent major depressive disorder (HCC)  F33.1       Past Psychiatric History:  Previous Psych Hospitalizations: -Denies Outpatient treatment:  -Current PCP Harlene Saddler, MD Medications Current: - Celexa  20 mg once daily Next Steps: - Optimize atomoxetine  dosage, the patient is waiting for psychiatric testing referrals [facility] Medication Trials: - No medication trials Suicide & Violence: - Last episode of suicidal ideation with passive thoughts reported 6 months ago -Currently denies SI, HI, AVH -Reports that emotional distress  with husband usually causes SI thoughts Substance Use: - Denies Psychotherapy: - Currently participating in psychotherapy with Selinda Robichaud LCSW Legal:  - Denies  Past Medical History:  Past Medical History:  Diagnosis Date   Depression    DVT (deep venous thrombosis) (HCC)    PE (pulmonary embolism)    Postpartum depression 08/12/2015    Protein S deficiency (HCC)     Past Surgical History:  Procedure Laterality Date   ANKLE SURGERY Left    CESAREAN SECTION N/A 07/17/2016   Procedure: C-Section;  Surgeon: Mitzie BROCKS Ward, MD;  Location: [facility] ORS;  Service: Obstetrics;  Laterality: N/A;   CHOLECYSTECTOMY N/A 04/23/2017   Procedure: LAPAROSCOPIC CHOLECYSTECTOMY;  Surgeon: Nicholaus Selinda Birmingham, MD;  Location: [facility] ORS;  Service: General;  Laterality: N/A;    Family Psychiatric History: No additional  Family History:  Family History  Problem Relation Age of Onset   Hyperlipidemia Mother    Depression Mother    Heart attack Father    Heart disease Father    Deep vein thrombosis Brother    Diabetes Maternal Grandfather    Bladder Cancer Maternal Grandfather     Social History:  Social History   Socioeconomic History   Marital status: Married    Spouse name: [name]   Number of children: Not on file   Years of education: Not on file   Highest education level: Not on file  Occupational History   Not on file  Tobacco Use   Smoking status: Never   Smokeless tobacco: Never  Vaping Use   Vaping status: Never Used  Substance and Sexual Activity   Alcohol use: Not Currently    Alcohol/week: 1.0 standard drink of alcohol    Types: 1 Cans of beer per week    Comment: rarely   Drug use: No   Sexual activity: Yes    Birth control/protection: I.U.D.  Other Topics Concern   Not on file  Social History Narrative   Not on file   Social Drivers of Health   Financial Resource Strain: Low Risk  (09/12/2023)   [facility] from [facility]   Overall Financial Resource Strain (CARDIA)    Difficulty of Paying Living Expenses: Not hard at all  Food Insecurity: No Food Insecurity (09/12/2023)   Received from [facility]   Hunger Vital Sign    Within the past 12 months, you worried that your food would run out before you got the money to buy more.: Never true    Within the past 12 months, the  food you bought just didn't last and you didn't have money to get more.: Never true  Transportation Needs: No Transportation Needs (09/12/2023)   Received from [facility] - Transportation    In the past 12 months, has lack of transportation kept you from medical appointments or from getting medications?: No    Lack of Transportation (Non-Medical): No  Physical Activity: Not on file  Stress: Not on file  Social Connections: Not on file    Allergies: No Known Allergies  Metabolic Disorder Labs: Lab Results  Component Value Date   HGBA1C 5.4 05/23/2023   No results found for: PROLACTIN Lab Results  Component Value Date   CHOL 189 05/23/2023   TRIG 167 (A) 05/23/2023   HDL 44 05/23/2023   CHOLHDL 5.4 (H) 07/13/2015   LDLCALC 117 05/23/2023   LDLCALC 154 (H) 07/13/2015   Lab Results  Component Value Date  TSH 2.12 05/23/2023   TSH 1.690 07/13/2015    Therapeutic Level Labs: No results found for: LITHIUM No results found for: VALPROATE No results found for: CBMZ  Current Medications: Current Outpatient Medications  Medication Sig Dispense Refill   atomoxetine  (STRATTERA ) 25 MG capsule Take 1 capsule (25 mg total) by mouth daily. 20 capsule 0   citalopram  (CELEXA ) 20 MG tablet Take 1 tablet (20 mg total) by mouth daily. 30 tablet 3   rivaroxaban  (XARELTO ) 20 MG TABS tablet Take 1 tablet (20 mg total) by mouth daily with supper. 90 tablet 0   spironolactone (ALDACTONE) 100 MG tablet Take 100 mg by mouth daily.     tretinoin (RETIN-A) 0.025 % cream Apply topically at bedtime.     triamcinolone ointment (KENALOG) 0.1 % Apply 1 Application topically 2 (two) times daily.     ZEPBOUND 7.5 MG/0.5ML Pen Inject 7.5 mg into the skin once a week.     No current facility-administered medications for this visit.     Musculoskeletal: Strength & Muscle Tone: within normal limits Gait & Station: normal Patient leans: N/A  Psychiatric Specialty  Exam: Review of Systems  Constitutional: Negative.   HENT: Negative.    Eyes: Negative.   Respiratory: Negative.    Cardiovascular: Negative.   Gastrointestinal: Negative.   Endocrine: Negative.   Genitourinary: Negative.   Musculoskeletal: Negative.   Skin: Negative.   Allergic/Immunologic: Negative.   Neurological: Negative.   Hematological: Negative.   Psychiatric/Behavioral:  Positive for decreased concentration.        Poor sleep due to medication    There were no vitals taken for this visit.There is no height or weight on file to calculate BMI.  General Appearance: Well Groomed  Eye Contact:  Good  Speech:  Clear and Coherent  Volume:  Normal  Mood:  Euthymic  Affect:  Appropriate  Thought Process:  Coherent  Orientation:  Full (Time, Place, and Person)  Thought Content: Logical   Suicidal Thoughts:  No  Homicidal Thoughts:  No  Memory:  Immediate;   Good Recent;   Good Remote;   Good  Judgement:  Good  Insight:  Good  Psychomotor Activity:  Normal  Concentration:  Concentration: Good and Attention Span: Good  Recall:  Good  Fund of Knowledge: Good  Language: Good  Akathisia:  No  Handed:  Right  AIMS (if indicated):   Assets:  Desire for Improvement Financial Resources/Insurance Housing  ADL's:  Intact  Cognition: WNL  Sleep:  Fair   Screenings: GAD-7    Flowsheet Row Office Visit from 10/18/2023 in [facility] Office Visit from 10/09/2023 in [facility] Office Visit from 09/05/2023 in [facility] at [facility] Office Visit from 07/12/2015 in [facility] at [facility]  Total GAD-7 Score 5 8 8 5    PHQ2-9    Flowsheet Row Office Visit from 10/18/2023 in [facility] Office Visit from 10/09/2023 in [facility] & [facility] Medicine at [facility] Office Visit from 09/05/2023 in [facility] Primary Care & Sports Medicine at [facility] Office Visit from 07/12/2015 in [facility] Primary Care & Sports Medicine at [facility]  PHQ-2 Total Score 2 2 4 5   PHQ-9 Total Score 11 13 16 13    Flowsheet Row UC from 11/30/2022 in [facility]  ED  from 06/20/2021 in [facility] Emergency Department at [facility]  C-SSRS RISK CATEGORY No Risk No Risk     Assessment and Plan: Assessment - Diagnosis: Attention deficit hyperactivity disorder (ADHD), unspecified ADHD type [F90.9]  2. Moderate episode of recurrent major depressive disorder (HCC) [F33.1]   - Progress: Pt reports atomoxetine  is not doing well, and reports that she is not able to sleep well with the medication.  - Risk Factors: Worsening symptoms, Suicide risk  Plan - Medications:  Start Vyvanse  10mg , for ADHD, unspecified. Considered Wellbutrin but pt reports that Wellbutrin increased cravings.  Continue Celexa  20mg  once daily - Psychotherapy: Currently in therapy with Jason Robichaud. - Education: Patient has been educated on how to contact this provider through my chart messaging or by calling the clinic.  Patient has been educated on medications, purpose, side effects, and adverse reactions.  Patient has been also been educated that if she has depressive symptoms she is advised not to have a firearm at home but she states that the firearm is secured and locked away and ammunition is separated with another locked box. - Follow-Up: Patient will follow up in 2 weeks to monitor side effects and symptom management. - Referrals: [facility] for ADHD Testing, waiting on scheduling.  - Safety Planning: The patient has been educated, if they should have suicidal thoughts with or without a plan to call 911, or go to the closest emergency department.  Pt verbalized understanding.  Pt reports having a firearm in the home but states that the gun is locked  with a trigger guard lock and the ammunition is separated and another lockbox and both are secured in a locked state..  Pt also agrees to call the clinic should they have worsening symptoms before the next appointment.      Patient/Guardian was advised Release of Information must be obtained prior to any record release in order to collaborate their care with an outside provider. Patient/Guardian was advised if they have not already done so to contact the registration department to sign all necessary forms in order for us  to release information regarding their care.   Consent: Patient/Guardian gives verbal consent for treatment and assignment of benefits for services provided during this visit. Patient/Guardian expressed understanding and agreed to proceed.    [name], NP 11/01/2023, 2:31 PM

## 2023-11-09 ENCOUNTER — Other Ambulatory Visit: Payer: Self-pay | Admitting: Student

## 2023-11-09 MED ORDER — ZEPBOUND 12.5 MG/0.5ML ~~LOC~~ SOAJ: 12.5000 mg | SUBCUTANEOUS | 1 refills | Status: DC

## 2023-11-09 MED ORDER — TIRZEPATIDE-WEIGHT MANAGEMENT 10 MG/0.5ML ~~LOC~~ SOLN: 10.0000 mg | SUBCUTANEOUS | 1 refills | Status: DC

## 2023-11-09 NOTE — Telephone Encounter (Signed)
Please review and advise patients message

## 2023-11-14 DIAGNOSIS — F419 Anxiety disorder, unspecified: Secondary | ICD-10-CM | POA: Diagnosis not present

## 2023-11-14 DIAGNOSIS — F900 Attention-deficit hyperactivity disorder, predominantly inattentive type: Secondary | ICD-10-CM | POA: Diagnosis not present

## 2023-11-19 ENCOUNTER — Telehealth: Admitting: Psychiatry

## 2023-11-19 DIAGNOSIS — F331 Major depressive disorder, recurrent, moderate: Secondary | ICD-10-CM

## 2023-11-19 DIAGNOSIS — F909 Attention-deficit hyperactivity disorder, unspecified type: Secondary | ICD-10-CM | POA: Diagnosis not present

## 2023-11-19 MED ORDER — LISDEXAMFETAMINE DIMESYLATE 20 MG PO CAPS: 20.0000 mg | ORAL_CAPSULE | Freq: Every day | ORAL | 0 refills | Status: DC

## 2023-11-19 NOTE — Addendum Note (Signed)
 Addended by: Fleur Audino on: 11/19/2023 04:09 PM   Modules accepted: Level of Service

## 2023-11-19 NOTE — Progress Notes (Signed)
 [facility] MD/PA/NP OP Progress Note  11/19/2023 2:00 PM Michelle Hogan  MRN:  [id number]  Chief Complaint: Routine Followup  Virtual Visit via Video Note  I connected with Michelle Hogan on 11/19/23 at  2:00 PM EDT by a video enabled telemedicine application and verified that I am speaking with the correct person using two identifiers.  Location: Patient: [address]  [place] [address]  Provider: [facility] Office of Provider   I discussed the limitations of evaluation and management by telemedicine and the availability of in person appointments. The patient expressed understanding and agreed to proceed.    I discussed the assessment and treatment plan with the patient. The patient was provided an opportunity to ask questions and all were answered. The patient agreed with the plan and demonstrated an understanding of the instructions.   The patient was advised to call back or seek an in-person evaluation if the symptoms worsen or if the condition fails to improve as anticipated.  I provided 30 minutes of non-face-to-face time during this encounter.   [name], NP    HPI: 38 year old female presenting to [facility] for follow-up.  Patient reports that she has been doing well for the last week or 2 but states that she has been having issues with her executive functioning stating that the Vyvanse  has been helping but is not meeting her expectations.  Patient reports that she does feel will be energy and stating that she does feel a little bit of jitteriness but reports in her executive functioning being improved.  Patient reports that her depression has been being managed and stating that the only situational stressors she has at this time is due to couples counseling and working on herself with her therapist in regards of her financial habits as well as trying to improve communication skills with her partner.  Patient states that she has not had a significant side effects from the  Vyvanse  and is willing to move up on the dosage.  Patient also reports that she is in agreement with doing that drug screen testing will be completed.  Based on this assessment interview is recommended for the patient to increase her Vyvanse  to 20 mg once a day.  With the new prescription sent today.  Patient is to continue Celexa  20 mg once daily and she is satisfied currently with management of the depressive and anxiety symptoms.  Patient will no other questions or concerns at this time.  Patient denies SI, HI, AVH.  Patient is in agreement with treatment plan.  He patient to follow up in 2 weeks.  For medication adjustment and assessment.  I personally spent a total of 30 minutes in the care of the patient today including preparing to see the patient, getting/reviewing separately obtained history, performing a medically appropriate exam/evaluation, counseling and educating, placing orders, documenting clinical information in the EHR, and coordinating care.   Visit Diagnosis:    ICD-10-CM   1. Attention deficit hyperactivity disorder (ADHD), unspecified ADHD type  F90.9     2. Moderate episode of recurrent major depressive disorder (HCC)  F33.1       Past Psychiatric History:  Previous Psych Hospitalizations: -Denies Outpatient treatment:  -Current PCP Harlene Saddler, MD Medications Current: - Vyvanse  20mg  once daily - Celexa  20 mg once daily Next Steps: - Optimize atomoxetine  dosage, the patient is waiting for psychiatric testing referrals [facility] Medication Trials: - No medication trials Suicide & Violence: - Last episode of suicidal ideation  with passive thoughts reported 6 months ago -Currently denies SI, HI, AVH -Reports that emotional distress with husband usually causes SI thoughts Substance Use: - Denies Psychotherapy: - Currently participating in psychotherapy with Selinda Robichaud LCSW Legal:  - Denies  Past Medical History:  Past Medical History:  Diagnosis Date    Depression    DVT (deep venous thrombosis) (HCC)    PE (pulmonary embolism)    Postpartum depression 08/12/2015   Protein S deficiency (HCC)     Past Surgical History:  Procedure Laterality Date   ANKLE SURGERY Left    CESAREAN SECTION N/A 07/17/2016   Procedure: C-Section;  Surgeon: Mitzie BROCKS Ward, MD;  Location: [facility] ORS;  Service: Obstetrics;  Laterality: N/A;   CHOLECYSTECTOMY N/A 04/23/2017   Procedure: LAPAROSCOPIC CHOLECYSTECTOMY;  Surgeon: Nicholaus Selinda Birmingham, MD;  Location: [facility] ORS;  Service: General;  Laterality: N/A;    Family Psychiatric History: No additional  Family History:  Family History  Problem Relation Age of Onset   Hyperlipidemia Mother    Depression Mother    Heart attack Father    Heart disease Father    Deep vein thrombosis Brother    Diabetes Maternal Grandfather    Bladder Cancer Maternal Grandfather     Social History:  Social History   Socioeconomic History   Marital status: Married    Spouse name: [name]   Number of children: Not on file   Years of education: Not on file   Highest education level: Not on file  Occupational History   Not on file  Tobacco Use   Smoking status: Never   Smokeless tobacco: Never  Vaping Use   Vaping status: Never Used  Substance and Sexual Activity   Alcohol use: Not Currently    Alcohol/week: 1.0 standard drink of alcohol    Types: 1 Cans of beer per week    Comment: rarely   Drug use: No   Sexual activity: Yes    Birth control/protection: I.U.D.  Other Topics Concern   Not on file  Social History Narrative   Not on file   Social Drivers of Health   Financial Resource Strain: Low Risk  (09/12/2023)   Received from [facility]   Overall Financial Resource Strain (CARDIA)    Difficulty of Paying Living Expenses: Not hard at all  Food Insecurity: No Food Insecurity (09/12/2023)   Received from [facility]   Hunger Vital Sign    Within the past 12 months, you  worried that your food would run out before you got the money to buy more.: Never true    Within the past 12 months, the food you bought just didn't last and you didn't have money to get more.: Never true  Transportation Needs: No Transportation Needs (09/12/2023)   Received from [facility] - Transportation    In the past 12 months, has lack of transportation kept you from medical appointments or from getting medications?: No    Lack of Transportation (Non-Medical): No  Physical Activity: Not on file  Stress: Not on file  Social Connections: Not on file    Allergies: No Known Allergies  Metabolic Disorder Labs: Lab Results  Component Value Date   HGBA1C 5.4 05/23/2023   No results found for: PROLACTIN Lab Results  Component Value Date   CHOL 189 05/23/2023   TRIG 167 (A) 05/23/2023   HDL 44 05/23/2023   CHOLHDL 5.4 (H) 07/13/2015   LDLCALC 117  05/23/2023   LDLCALC 154 (H) 07/13/2015   Lab Results  Component Value Date   TSH 2.12 05/23/2023   TSH 1.690 07/13/2015    Therapeutic Level Labs: No results found for: LITHIUM No results found for: VALPROATE No results found for: CBMZ  Current Medications: Current Outpatient Medications  Medication Sig Dispense Refill   atomoxetine  (STRATTERA ) 25 MG capsule Take 1 capsule (25 mg total) by mouth daily. 20 capsule 0   citalopram  (CELEXA ) 20 MG tablet Take 1 tablet (20 mg total) by mouth daily. 30 tablet 3   lisdexamfetamine (VYVANSE ) 10 MG capsule Take 1 capsule (10 mg total) by mouth daily. 14 capsule 0   rivaroxaban  (XARELTO ) 20 MG TABS tablet Take 1 tablet (20 mg total) by mouth daily with supper. 90 tablet 0   spironolactone (ALDACTONE) 100 MG tablet Take 100 mg by mouth daily.     tirzepatide  (ZEPBOUND ) 12.5 MG/0.5ML Pen Inject 12.5 mg into the skin once a week. 2 mL 1   tretinoin (RETIN-A) 0.025 % cream Apply topically at bedtime.     triamcinolone ointment (KENALOG) 0.1 % Apply 1  Application topically 2 (two) times daily.     No current facility-administered medications for this visit.     Musculoskeletal: Strength & Muscle Tone: within normal limits Gait & Station: normal Patient leans: N/A  Psychiatric Specialty Exam: Review of Systems  Constitutional: Negative.   HENT: Negative.    Eyes: Negative.   Respiratory: Negative.    Cardiovascular: Negative.   Gastrointestinal: Negative.   Endocrine: Negative.   Genitourinary: Negative.   Musculoskeletal: Negative.   Skin: Negative.   Allergic/Immunologic: Negative.   Neurological: Negative.   Hematological: Negative.   Psychiatric/Behavioral:  Positive for dysphoric mood. The patient is nervous/anxious.     There were no vitals taken for this visit.There is no height or weight on file to calculate BMI.  General Appearance: Well Groomed  Eye Contact:  Good  Speech:  Clear and Coherent  Volume:  Normal  Mood:  Anxious and Depressed  Affect:  Depressed  Thought Process:  Coherent  Orientation:  Full (Time, Place, and Person)  Thought Content: Logical   Suicidal Thoughts:  No  Homicidal Thoughts:  No  Memory:  Immediate;   Good Recent;   Good Remote;   Good  Judgement:  Good  Insight:  Good  Psychomotor Activity:  Normal  Concentration:  Concentration: Good and Attention Span: Good  Recall:  Good  Fund of Knowledge: Good  Language: Good  Akathisia:  No  Handed:  Right  AIMS (if indicated):   Assets:  Desire for Improvement Financial Resources/Insurance Housing  ADL's:  Intact  Cognition: WNL  Sleep:  Good   Screenings: GAD-7    Flowsheet Row Office Visit from 10/18/2023 in [facility] Office Visit from 10/09/2023 in [facility] at [facility] Office Visit from 09/05/2023 in [facility] Primary Care & Sports Medicine at [facility] Office Visit from 07/12/2015 in [facility] at  [facility]  Total GAD-7 Score 5 8 8 5    PHQ2-9    Flowsheet Row Office Visit from 10/18/2023 in [facility] Office Visit from 10/09/2023 in [facility] Primary Care & Sports Medicine at [facility] Office Visit from 09/05/2023 in [facility] Primary Care & Sports Medicine at [facility] Office Visit from 07/12/2015 in [facility] Care & [facility] Medicine at [facility]  [place]  PHQ-2 Total Score 2 2 4 5   PHQ-9 Total Score 11 13 16 13    Flowsheet Row UC from 11/30/2022 in [facility] at [facility]  ED from 06/20/2021 in [facility] at [facility]  C-SSRS RISK CATEGORY No Risk No Risk     Assessment and Plan:  - Diagnosis: Attention deficit hyperactivity disorder (ADHD), unspecified ADHD type [F90.9]  2. Moderate episode of recurrent major depressive disorder (HCC) [F33.1]   - Progress: Pt reports atomoxetine  is not doing well, and reports that she is not able to sleep well with the medication.  - Risk Factors: Worsening symptoms, Suicide risk  Plan - Medications:  Increase Vyvanse  20mg , for ADHD, unspecified. Considered Wellbutrin but pt reports that Wellbutrin increased cravings.  Continue Celexa  20mg  once daily - Psychotherapy: Currently in therapy with Jason Robichaud. - Education: Patient has been educated on how to contact this provider through my chart messaging or by calling the clinic.  Patient has been educated on medications, purpose, side effects, and adverse reactions.  Patient has been also been educated that if she has depressive symptoms she is advised not to have a firearm at home but she states that the firearm is secured and locked away and ammunition is separated with another locked box. - Follow-Up: Patient will follow up in 2 weeks to monitor side effects and symptom management. - Referrals: [facility] for ADHD Testing, waiting on scheduling.  - Safety Planning: The patient has  been educated, if they should have suicidal thoughts with or without a plan to call 911, or go to the closest emergency department.  Pt verbalized understanding.  Pt reports having a firearm in the home but states that the gun is locked with a trigger guard lock and the ammunition is separated and another lockbox and both are secured in a locked state..  Pt also agrees to call the clinic should they have worsening symptoms before the next appointment.    Patient/Guardian was advised Release of Information must be obtained prior to any record release in order to collaborate their care with an outside provider. Patient/Guardian was advised if they have not already done so to contact the registration department to sign all necessary forms in order for us  to release information regarding their care.   Consent: Patient/Guardian gives verbal consent for treatment and assignment of benefits for services provided during this visit. Patient/Guardian expressed understanding and agreed to proceed.    [name], NP 11/19/2023, 2:00 PM

## 2023-11-29 MED ORDER — RIVAROXABAN 20 MG PO TABS: 20.0000 mg | ORAL_TABLET | Freq: Every day | ORAL | 0 refills | Status: DC

## 2023-11-29 NOTE — Telephone Encounter (Signed)
Please review patient's message:

## 2023-12-03 ENCOUNTER — Telehealth (INDEPENDENT_AMBULATORY_CARE_PROVIDER_SITE_OTHER): Admitting: Psychiatry

## 2023-12-03 DIAGNOSIS — F331 Major depressive disorder, recurrent, moderate: Secondary | ICD-10-CM

## 2023-12-03 DIAGNOSIS — F909 Attention-deficit hyperactivity disorder, unspecified type: Secondary | ICD-10-CM

## 2023-12-03 MED ORDER — LISDEXAMFETAMINE DIMESYLATE 30 MG PO CAPS
30.0000 mg | ORAL_CAPSULE | Freq: Every day | ORAL | 0 refills | Status: DC
Start: 2023-12-03 — End: 2023-12-17

## 2023-12-03 NOTE — Progress Notes (Signed)
 [facility] MD/PA/NP OP Progress Note  12/03/2023 1:55 PM Michelle Hogan  MRN:  [id number]  Chief Complaint: Routine Followup  Virtual Visit via Video Note  I connected with Michelle Hogan on 12/03/23 at  2:00 PM EDT by a video enabled telemedicine application and verified that I am speaking with the correct person using two identifiers.  Location: Patient: [address]  [place] [address]  Provider: [facility] of Provider   I discussed the limitations of evaluation and management by telemedicine and the availability of in person appointments. The patient expressed understanding and agreed to proceed.    I discussed the assessment and treatment plan with the patient. The patient was provided an opportunity to ask questions and all were answered. The patient agreed with the plan and demonstrated an understanding of the instructions.   The patient was advised to call back or seek an in-person evaluation if the symptoms worsen or if the condition fails to improve as anticipated.  I provided 30 minutes of non-face-to-face time during this encounter.   [name], NP    HPI: 38 year old female presenting [facility] for follow-up.  Patient reports that she is canceled difference between the increase of Vyvanse  to 20 mg once daily stating that she actually felt better at the 10 mg once daily.  Patient does report that she feels the medication is not working as effective and states that she is not having any significant side effects.  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  Patient with no other complaints or questions.  Based on this assessment interview is recommended for the patient to increase Vyvanse  to 30 mg once daily patient has also been scheduled a urine drug screen to fulfill requirements for [facility] stimulant usage.  Patient to continue Celexa  20 mg once daily.  Patient denies SI, HI, AVH.  Patient is in agreement with treatment plan.  Patient will follow up in 2 weeks. Visit Diagnosis:    ICD-10-CM   1. Attention deficit hyperactivity disorder (ADHD), unspecified ADHD type  F90.9 lisdexamfetamine (VYVANSE ) 30 MG capsule    [id number] 11+Oxyco+Alc+Crt-Bund    2. Moderate episode of recurrent major depressive disorder (HCC)  F33.1       Past Psychiatric History:  Previous Psych Hospitalizations: -Denies Outpatient treatment:  -Current PCP Harlene Saddler, MD Medications Current: - Vyvanse  30mg  once daily - Celexa  20 mg once daily Next Steps: - Optimize atomoxetine  dosage, the patient is waiting for psychiatric testing referrals [facility] Medication Trials: - No medication trials Suicide & Violence: - Last episode of suicidal ideation with passive thoughts reported 6 months ago -Currently denies SI, HI, AVH -Reports that emotional distress with husband usually causes SI thoughts Substance Use: - Denies Psychotherapy: - Currently participating in psychotherapy with Selinda Robichaud LCSW Legal:  - Denies  Past Medical History:  Past Medical History:  Diagnosis Date   Depression    DVT (deep venous thrombosis) (HCC)    PE (pulmonary embolism)    Postpartum depression 08/12/2015   Protein S deficiency (HCC)     Past Surgical History:  Procedure Laterality Date   ANKLE SURGERY Left    CESAREAN SECTION N/A 07/17/2016   Procedure: C-Section;  Surgeon: Mitzie BROCKS Ward, MD;  Location: [facility] ORS;  Service: Obstetrics;  Laterality: N/A;   CHOLECYSTECTOMY N/A 04/23/2017   Procedure: LAPAROSCOPIC CHOLECYSTECTOMY;  Surgeon: Nicholaus Selinda Birmingham, MD;  Location: [facility] ORS;  Service: General;  Laterality: N/A;    Family Psychiatric History: No additional  Family History:  Family History  Problem Relation Age of Onset   Hyperlipidemia Mother    Depression Mother     Heart attack Father    Heart disease Father    Deep vein thrombosis Brother    Diabetes Maternal Grandfather    Bladder Cancer Maternal Grandfather     Social History:  Social History   Socioeconomic History   Marital status: Married    Spouse name: [name]   Number of children: Not on file   Years of education: Not on file   Highest education level: Not on file  Occupational History   Not on file  Tobacco Use   Smoking status: Never   Smokeless tobacco: Never  Vaping Use   Vaping status: Never Used  Substance and Sexual Activity   Alcohol use: Not Currently    Alcohol/week: 1.0 standard drink of alcohol    Types: 1 Cans of beer per week    Comment: rarely   Drug use: No   Sexual activity: Yes    Birth control/protection: I.U.D.  Other Topics Concern   Not on file  Social History Narrative   Not on file   Social Drivers of Health   Financial Resource Strain: Low Risk  (09/12/2023)   Received [facility]   Overall Financial Resource Strain (CARDIA)    Difficulty of Paying Living Expenses: Not hard at all  Food Insecurity: No Food Insecurity (09/12/2023)   Received from [facility]   Hunger Vital Sign    Within the past 12 months, you worried that your food would run out before you got the money to buy more.: Never true    Within the past 12 months, the food you bought just didn't last and you didn't have money to get more.: Never true  Transportation Needs: No Transportation Needs (09/12/2023)   Received [facility] - Transportation    In the past 12 months, has lack of transportation kept you from medical appointments or from getting medications?: No    Lack of Transportation (Non-Medical): No  Physical Activity: Not on file  Stress: Not on file  Social Connections: Not on file    Allergies: No Known Allergies  Metabolic Disorder Labs: Lab Results  Component Value Date   HGBA1C 5.4  05/23/2023   No results found for: PROLACTIN Lab Results  Component Value Date   CHOL 189 05/23/2023   TRIG 167 (A) 05/23/2023   HDL 44 05/23/2023   CHOLHDL 5.4 (H) 07/13/2015   LDLCALC 117 05/23/2023   LDLCALC 154 (H) 07/13/2015   Lab Results  Component Value Date   TSH 2.12 05/23/2023   TSH 1.690 07/13/2015    Therapeutic Level Labs: No results found for: LITHIUM No results found for: VALPROATE No results found for: CBMZ  Current Medications: Current Outpatient Medications  Medication Sig Dispense Refill   atomoxetine  (STRATTERA ) 25 MG capsule Take 1 capsule (25 mg total) by mouth daily. 20 capsule 0   citalopram  (CELEXA ) 20 MG tablet Take 1 tablet (20 mg total) by mouth daily. 30 tablet 3   lisdexamfetamine (VYVANSE ) 20 MG capsule Take 1 capsule (20 mg total) by mouth daily. 14 capsule 0   rivaroxaban  (XARELTO )  20 MG TABS tablet Take 1 tablet (20 mg total) by mouth daily with supper. 90 tablet 0   spironolactone (ALDACTONE) 100 MG tablet Take 100 mg by mouth daily.     tirzepatide  (ZEPBOUND ) 12.5 MG/0.5ML Pen Inject 12.5 mg into the skin once a week. 2 mL 1   tretinoin (RETIN-A) 0.025 % cream Apply topically at bedtime.     triamcinolone ointment (KENALOG) 0.1 % Apply 1 Application topically 2 (two) times daily.     No current facility-administered medications for this visit.     Musculoskeletal: Strength & Muscle Tone: within normal limits Gait & Station: normal Patient leans: N/A  Psychiatric Specialty Exam: Review of Systems  Constitutional: Negative.   HENT: Negative.    Eyes: Negative.   Respiratory: Negative.    Cardiovascular: Negative.   Gastrointestinal: Negative.   Endocrine: Negative.   Genitourinary: Negative.   Musculoskeletal: Negative.   Skin: Negative.   Allergic/Immunologic: Negative.   Neurological: Negative.   Hematological: Negative.   Psychiatric/Behavioral:  Positive for decreased concentration.     There were no vitals  taken for this visit.There is no height or weight on file to calculate BMI.  General Appearance: Well Groomed  Eye Contact:  Good  Speech:  Clear and Coherent  Volume:  Normal  Mood:  Euthymic  Affect:  Appropriate  Thought Process:  Coherent  Orientation:  Full (Time, Place, and Person)  Thought Content: Logical   Suicidal Thoughts:  No  Homicidal Thoughts:  No  Memory:  Immediate;   Good Recent;   Good Remote;   Good  Judgement:  Good  Insight:  Good  Psychomotor Activity:  Normal  Concentration:  Concentration: Good and Attention Span: Good  Recall:  Good  Fund of Knowledge: Good  Language: Good  Akathisia:  No  Handed:  Right  AIMS (if indicated):   Assets:  Desire for Improvement Financial Resources/Insurance Housing  ADL's:  Intact  Cognition: WNL  Sleep:  Good   Screenings: GAD-7    Flowsheet Row Office Visit from 10/18/2023 in [facility] Office Visit from 10/09/2023 in [facility] at [facility] Office Visit from 09/05/2023 in [facility] at [facility] Office Visit from 07/12/2015 in [facility] at [facility]  Total GAD-7 Score 5 8 8 5    PHQ2-9    Flowsheet Row Office Visit from 10/18/2023 in [facility] Office Visit from 10/09/2023 in [facility] at [facility] Office Visit from 09/05/2023 in [facility] & [facility] Medicine at [facility] Office Visit from 07/12/2015 in [facility] at [facility]  PHQ-2 Total Score 2 2 4 5   PHQ-9 Total Score 11 13 16 13    Flowsheet Row UC from 11/30/2022 in [facility]  ED from 06/20/2021 in [facility]  C-SSRS RISK CATEGORY No Risk No Risk     Assessment and Plan:  - Diagnosis: Attention deficit  hyperactivity disorder (ADHD), unspecified ADHD type [F90.9]  2. Moderate episode of recurrent major depressive disorder (HCC) [F33.1]   - Progress: Pt reports atomoxetine  is not doing well, and reports that she is not able to sleep well with the medication.  - Risk Factors: Worsening symptoms, Suicide risk  Plan - Medications:  Increase Vyvanse  30mg , for ADHD, unspecified.  Considered Wellbutrin but pt reports that Wellbutrin increased cravings.  Continue Celexa  20mg  once daily - Psychotherapy: Currently in therapy with Jason Robichaud. - Education: Patient has been educated on how to contact this provider through my chart messaging or by calling the clinic.  Patient has been educated on medications, purpose, side effects, and adverse reactions.  Patient has been also been educated that if she has depressive symptoms she is advised not to have a firearm at home but she states that the firearm is secured and locked away and ammunition is separated with another locked box. - Follow-Up: Patient will follow up in 2 weeks to monitor side effects and symptom management. - Referrals: [facility] for ADHD Testing, waiting on scheduling.  - Safety Planning: The patient has been educated, if they should have suicidal thoughts with or without a plan to call 911, or go to the closest emergency department.  Pt verbalized understanding.  Pt reports having a firearm in the home but states that the gun is locked with a trigger guard lock and the ammunition is separated and another lockbox and both are secured in a locked state..  Pt also agrees to call the clinic should they have worsening symptoms before the next appointment.    Patient/Guardian was advised Release of Information must be obtained prior to any record release in order to collaborate their care with an outside provider. Patient/Guardian was advised if they have not already done so to contact the registration department to sign all necessary forms in  order for us  to release information regarding their care.   Consent: Patient/Guardian gives verbal consent for treatment and assignment of benefits for services provided during this visit. Patient/Guardian expressed understanding and agreed to proceed.    [name], NP 12/03/2023, 1:55 PM

## 2023-12-17 ENCOUNTER — Telehealth (INDEPENDENT_AMBULATORY_CARE_PROVIDER_SITE_OTHER): Admitting: Psychiatry

## 2023-12-17 DIAGNOSIS — F909 Attention-deficit hyperactivity disorder, unspecified type: Secondary | ICD-10-CM

## 2023-12-17 DIAGNOSIS — F331 Major depressive disorder, recurrent, moderate: Secondary | ICD-10-CM

## 2023-12-17 MED ORDER — LISDEXAMFETAMINE DIMESYLATE 40 MG PO CAPS: 40.0000 mg | ORAL_CAPSULE | ORAL | 0 refills | Status: DC

## 2023-12-17 NOTE — Progress Notes (Signed)
 [facility] MD/PA/NP OP Progress Note  12/17/2023 2:32 PM Michelle Hogan  MRN:  [id number]  Chief Complaint: Routine Follow-up Virtual Visit via Video Note  I connected with Michelle Hogan on 12/17/23 at  2:30 PM EDT by a video enabled telemedicine application and verified that I am speaking with the correct person using two identifiers.  Location: Patient: [address]  [place] [address]  Provider: [facility] Office of Provider   I discussed the limitations of evaluation and management by telemedicine and the availability of in person appointments. The patient expressed understanding and agreed to proceed.    I discussed the assessment and treatment plan with the patient. The patient was provided an opportunity to ask questions and all were answered. The patient agreed with the plan and demonstrated an understanding of the instructions.   The patient was advised to call back or seek an in-person evaluation if the symptoms worsen or if the condition fails to improve as anticipated.  I provided 30 minutes of non-face-to-face time during this encounter.   [name], NP     HPI: 38 year old female presenting [facility] for follow-up.  Patient reports that she is doing well but states that she she wishes she could get more out of the medication.  Patient reports that she has been scheduled for psychiatric evaluation for ADHD on September 5 of next month.  Patient reports that she is satisfied and really happy about getting the test done quickly.  Patient reports that she is still having trouble feeling overwhelmed at home regarding her responsibilities at home and states she would like to try to go up on the medication.  Based on this assessment interview is recommended for the patient to be increased to Vyvanse  40 mg once daily due to patient feeling ineffective dosing.  Patient has been asked to him that this is [place] the most that this provider will provide until her ADHD testing is  completed.  Patient reports she is losing weight but is losing weight at an appropriate amount.  Patient with no other questions or concerns at this time.  Patient is in agreement with treatment plan.  Patient to follow up in 2 weeks.  Patient denies SI, HI, AVH.  Visit Diagnosis:    ICD-10-CM   1. Attention deficit hyperactivity disorder (ADHD), unspecified ADHD type  F90.9     2. Moderate episode of recurrent major depressive disorder (HCC)  F33.1       Past Psychiatric History:  Previous Psych Hospitalizations: -Denies Outpatient treatment:  -Current PCP Harlene Saddler, MD Medications Current: - Vyvanse  30mg  once daily - Celexa  20 mg once daily Next Steps: - Optimize atomoxetine  dosage, the patient is waiting for psychiatric testing referrals [facility] Medication Trials: - No medication trials Suicide & Violence: - Last episode of suicidal ideation with passive thoughts reported 6 months ago -Currently denies SI, HI, AVH -Reports that emotional distress with husband usually causes SI thoughts Substance Use: - Denies Psychotherapy: - Currently participating in psychotherapy with Selinda Robichaud LCSW Legal:  - Denies  Past Medical History:  Past Medical History:  Diagnosis Date   Depression    DVT (deep venous thrombosis) (HCC)    PE (pulmonary embolism)    Postpartum depression 08/12/2015   Protein S deficiency (HCC)     Past Surgical History:  Procedure Laterality Date   ANKLE SURGERY Left    CESAREAN SECTION N/A 07/17/2016   Procedure: C-Section;  Surgeon: Mitzie BROCKS Ward, MD;  Location: [facility] ORS;  Service: Obstetrics;  Laterality: N/A;   CHOLECYSTECTOMY N/A 04/23/2017   Procedure: LAPAROSCOPIC CHOLECYSTECTOMY;  Surgeon: Nicholaus Selinda Birmingham, MD;  Location: [facility] ORS;  Service: General;  Laterality: N/A;    Family Psychiatric History: No additional  Family History:  Family History  Problem Relation Age of Onset   Hyperlipidemia Mother    Depression Mother     Heart attack Father    Heart disease Father    Deep vein thrombosis Brother    Diabetes Maternal Grandfather    Bladder Cancer Maternal Grandfather     Social History:  Social History   Socioeconomic History   Marital status: Married    Spouse name: [name]   Number of children: Not on file   Years of education: Not on file   Highest education level: Not on file  Occupational History   Not on file  Tobacco Use   Smoking status: Never   Smokeless tobacco: Never  Vaping Use   Vaping status: Never Used  Substance and Sexual Activity   Alcohol use: Not Currently    Alcohol/week: 1.0 standard drink of alcohol    Types: 1 Cans of beer per week    Comment: rarely   Drug use: No   Sexual activity: Yes    Birth control/protection: I.U.D.  Other Topics Concern   Not on file  Social History Narrative   Not on file   Social Drivers of Health   Financial Resource Strain: Low Risk  (09/12/2023)   Received [facility]   Overall Financial Resource Strain (CARDIA)    Difficulty of Paying Living Expenses: Not hard at all  Food Insecurity: No Food Insecurity (09/12/2023)   Received from [facility]   Hunger Vital Sign    Within the past 12 months, you worried that your food would run out before you got the money to buy more.: Never true    Within the past 12 months, the food you bought just didn't last and you didn't have money to get more.: Never true  Transportation Needs: No Transportation Needs (09/12/2023)   Received from [facility]    In the past 12 months, has lack of transportation kept you from medical appointments or from getting medications?: No    Lack of Transportation (Non-Medical): No  Physical Activity: Not on file  Stress: Not on file  Social Connections: Not on file    Allergies: No Known Allergies  Metabolic Disorder Labs: Lab Results  Component Value Date   HGBA1C 5.4  05/23/2023   No results found for: PROLACTIN Lab Results  Component Value Date   CHOL 189 05/23/2023   TRIG 167 (A) 05/23/2023   HDL 44 05/23/2023   CHOLHDL 5.4 (H) 07/13/2015   LDLCALC 117 05/23/2023   LDLCALC 154 (H) 07/13/2015   Lab Results  Component Value Date   TSH 2.12 05/23/2023   TSH 1.690 07/13/2015    Therapeutic Level Labs: No results found for: LITHIUM No results found for: VALPROATE No results found for: CBMZ  Current Medications: Current Outpatient Medications  Medication Sig Dispense Refill   atomoxetine  (STRATTERA ) 25 MG capsule Take 1 capsule (25 mg total) by mouth daily. 20 capsule 0   citalopram  (CELEXA ) 20 MG tablet Take 1 tablet (20 mg total) by mouth daily. 30 tablet 3   lisdexamfetamine (VYVANSE ) 30 MG capsule Take 1 capsule (30 mg total) by mouth daily. 14 capsule 0  rivaroxaban  (XARELTO ) 20 MG TABS tablet Take 1 tablet (20 mg total) by mouth daily with supper. 90 tablet 0   spironolactone (ALDACTONE) 100 MG tablet Take 100 mg by mouth daily.     tirzepatide  (ZEPBOUND ) 12.5 MG/0.5ML Pen Inject 12.5 mg into the skin once a week. 2 mL 1   tretinoin (RETIN-A) 0.025 % cream Apply topically at bedtime.     triamcinolone ointment (KENALOG) 0.1 % Apply 1 Application topically 2 (two) times daily.     No current facility-administered medications for this visit.     Musculoskeletal: Strength & Muscle Tone: within normal limits Gait & Station: normal Patient leans: N/A  Psychiatric Specialty Exam: Review of Systems  Constitutional: Negative.   HENT: Negative.    Eyes: Negative.   Respiratory: Negative.    Cardiovascular: Negative.   Gastrointestinal: Negative.   Endocrine: Negative.   Genitourinary: Negative.   Musculoskeletal: Negative.   Skin: Negative.   Allergic/Immunologic: Negative.   Neurological: Negative.   Hematological: Negative.   Psychiatric/Behavioral:  Positive for decreased concentration. The patient is  nervous/anxious.     There were no vitals taken for this visit.There is no height or weight on file to calculate BMI.  General Appearance: Well Groomed  Eye Contact:  Good  Speech:  Clear and Coherent  Volume:  Normal  Mood:  Anxious and Depressed  Affect:  Appropriate  Thought Process:  Coherent  Orientation:  Full (Time, Place, and Person)  Thought Content: Logical   Suicidal Thoughts:  No  Homicidal Thoughts:  No  Memory:  Immediate;   Good Recent;   Good Remote;   Good  Judgement:  Good  Insight:  Good  Psychomotor Activity:  Normal  Concentration:  Concentration: Fair and Attention Span: Fair  Recall:  Good  Fund of Knowledge: Good  Language: Good  Akathisia:  No  Handed:  Right  AIMS (if indicated):   Assets:  Desire for Improvement Financial Resources/Insurance Housing  ADL's:  Intact  Cognition: WNL  Sleep:  Good   Screenings: GAD-7    Flowsheet Row Office Visit from 10/18/2023 in [facility] Office Visit from 10/09/2023 in [facility] at [facility] Office Visit from 09[facility]05/2023 in [facility] Care [facility] at [facility] Office Visit from 07/12/2015 in [facility] at [facility]  Total GAD-7 Score 5 8 8 5    PHQ2-9    Flowsheet Row Office Visit from 10/18/2023 in [facility] Office Visit from 10/09/2023 in [facility] at [facility] Office Visit from 09/05/2023 in [facility] & [facility] at [facility] Office Visit from 07/12/2015 in [facility] & [facility] Medicine at [facility]  PHQ-2 Total Score 2 2 4 5   PHQ-9 Total Score 11 13 16 13    Flowsheet Row UC from 11/30/2022 in [facility]  ED from 06/20/2021 in [facility] Emergency Department at [facility]  C-SSRS RISK CATEGORY No Risk No Risk      Assessment and Plan:  - Diagnosis: Attention deficit hyperactivity disorder (ADHD), unspecified ADHD type [F90.9]  2. Moderate episode of recurrent major depressive disorder (HCC) [F33.1]   - Progress: Pt reports atomoxetine  is not doing well, and reports that she is not able to sleep well with the medication.  - Risk Factors: Worsening symptoms, Suicide risk  Plan -  Medications:  Increase Vyvanse  40mg , for ADHD, unspecified. Considered Wellbutrin but pt reports that Wellbutrin increased cravings.  Continue Celexa  20mg  once daily Recommended supplements - Psychotherapy: Currently in therapy with Jason Robichaud. - Education: Patient has been educated on how to contact this provider through my chart messaging or by calling the clinic.  Patient has been educated on medications, purpose, side effects, and adverse reactions.  Patient has been also been educated that if she has depressive symptoms she is advised not to have a firearm at home but she states that the firearm is secured and locked away and ammunition is separated with another locked box. - Follow-Up: Patient will follow up in 2 weeks to monitor side effects and symptom management. - Referrals: [facility] for ADHD Testing Sept 5th, waiting on scheduling.  - Safety Planning: The patient has been educated, if they should have suicidal thoughts with or without a plan to call 911, or go to the closest emergency department.  Pt verbalized understanding.  Pt reports having a firearm in the home but states that the gun is locked with a trigger guard lock and the ammunition is separated and another lockbox and both are secured in a locked state..  Pt also agrees to call the clinic should they have worsening symptoms before the next appointment.    Patient/Guardian was advised Release of Information must be obtained prior to any record release in order to collaborate their care with an outside provider. Patient/Guardian was advised if  they have not already done so to contact the registration department to sign all necessary forms in order for us  to release information regarding their care.   Consent: Patient/Guardian gives verbal consent for treatment and assignment of benefits for services provided during this visit. Patient/Guardian expressed understanding and agreed to proceed.    [name], NP 12/17/2023, 2:32 PM

## 2023-12-25 ENCOUNTER — Other Ambulatory Visit
Admission: RE | Admit: 2023-12-25 | Discharge: 2023-12-25 | Disposition: A | Discharge status: Home / Self Care | Source: Ambulatory Visit | Attending: Psychiatry | Admitting: Psychiatry

## 2023-12-25 DIAGNOSIS — F909 Attention-deficit hyperactivity disorder, unspecified type: Secondary | ICD-10-CM | POA: Diagnosis not present

## 2023-12-27 LAB — DRUG SCREEN 764883 11+OXYCO+ALC+CRT-BUND
BENZODIAZ UR QL: NEGATIVE ng/mL
Barbiturate screen, urine: NEGATIVE ng/mL
Cannabinoid Quant, Ur: NEGATIVE ng/mL
Cocaine (Metab.): NEGATIVE ng/mL
Creatinine, Urine: 234.3 mg/dL (ref 20.0–300.0)
Ethanol U, Quan: NEGATIVE %
Meperidine: NEGATIVE ng/mL
Methadone Screen, Urine: NEGATIVE ng/mL
Nitrite Urine, Quantitative: NEGATIVE ug/mL
OPIATE SCREEN URINE: NEGATIVE ng/mL
Oxycodone/Oxymorphone, Urine: NEGATIVE ng/mL
Phencyclidine, Ur: NEGATIVE ng/mL
Propoxyphene, Urine: NEGATIVE ng/mL
Tramadol: NEGATIVE ng/mL
pH, Urine: 5.4 (ref 4.5–8.9)

## 2023-12-27 LAB — DRUG PROFILE 799016
Amphetamine GC/MS Conf: >3000 ng/mL
Amphetamine, Ur: POSITIVE — AB
Amphetamines: POSITIVE — AB
Methamphetamine, Ur: NEGATIVE

## 2023-12-28 ENCOUNTER — Ambulatory Visit: Admitting: Psychology

## 2023-12-28 DIAGNOSIS — F89 Unspecified disorder of psychological development: Secondary | ICD-10-CM | POA: Diagnosis not present
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
## 2023-12-31 ENCOUNTER — Telehealth (INDEPENDENT_AMBULATORY_CARE_PROVIDER_SITE_OTHER): Admitting: Psychiatry

## 2023-12-31 DIAGNOSIS — F909 Attention-deficit hyperactivity disorder, unspecified type: Secondary | ICD-10-CM | POA: Diagnosis not present

## 2023-12-31 DIAGNOSIS — F331 Major depressive disorder, recurrent, moderate: Secondary | ICD-10-CM

## 2023-12-31 MED ORDER — LISDEXAMFETAMINE DIMESYLATE 40 MG PO CAPS: 40.0000 mg | ORAL_CAPSULE | ORAL | 0 refills | Status: DC

## 2023-12-31 NOTE — Progress Notes (Signed)
 BH MD/PA/NP OP Progress Note  12/31/2023 10:58 AM Michelle Hogan  MRN:  [id number]  Chief Complaint: Routine Follow-up  Virtual Visit via Video Note  I connected with Michelle Hogan on 12/31/23 at 11:00 AM EDT by a video enabled telemedicine application and verified that I am speaking with the correct person using two identifiers.  Location: Patient: [address]  [place] [address]  Provider: [facility] Office of Provider   I discussed the limitations of evaluation and management by telemedicine and the availability of in person appointments. The patient expressed understanding and agreed to proceed.    I discussed the assessment and treatment plan with the patient. The patient was provided an opportunity to ask questions and all were answered. The patient agreed with the plan and demonstrated an understanding of the instructions.   The patient was advised to call back or seek an in-person evaluation if the symptoms worsen or if the condition fails to improve as anticipated.  I provided 30 minutes of non-face-to-face time during this encounter.   [name], NP    HPI: 38 year old female presenting [facility] for follow-up.  Patient currently undergoing testing at [facility] clinic regarding ADHD testing.  Patient has completed first assessment as well as online submissions.  Patient has a follow-up this Wednesday and will have follow-up after all testing is completed the week after.  Patient reports she excited for the testing to be completed and states that she is looking forward to adjusting medications.  At this time patient reports that the increase of Vyvanse  to 40 mg has not helped at all and states that she is still struggling with ADHD symptoms.  Patient has been advised that no adjustment can be made further until the ADHD evaluation is completed and we will discuss further what treatment plans are after testing is reviewed.  Patient is in agreement with treatment plan.   Patient denies SI, HI, AVH.  Patient reports no significant improvement stating that depression is under control only is exacerbated after having argument with her husband regarding financials or other issues.  Patient reports that she continues to see Michelle Hogan once a week regarding couples counseling regarding to her financial stressors and relationship stressors.  Patient reports that she is in good relationship is just a matter of behavior who that seems to contribute to the concerns.  Patient with no other questions or concerns at this time.  Patient is in agreement with treatment plan.  Patient will follow-up in 2 weeks. Visit Diagnosis:    ICD-10-CM   1. Attention deficit hyperactivity disorder (ADHD), unspecified ADHD type  F90.9 lisdexamfetamine (VYVANSE ) 40 MG capsule    2. Moderate episode of recurrent major depressive disorder (HCC)  F33.1       Past Psychiatric History:  Previous Psych Hospitalizations: -Denies Outpatient treatment:  -Current PCP Harlene Saddler, MD Medications Current: - Vyvanse  30mg  once daily - Celexa  20 mg once daily Next Steps: - Optimize atomoxetine  dosage, the patient is waiting for psychiatric testing referrals [facility] Medication Trials: - No medication trials Suicide & Violence: - Last episode of suicidal ideation with passive thoughts reported 6 months ago -Currently denies SI, HI, AVH -Reports that emotional distress with husband usually causes SI thoughts Substance Use: - Denies Psychotherapy: - Currently participating in psychotherapy with Selinda Robichaud LCSW Legal:  - Denies  Past Medical History:  Past Medical History:  Diagnosis Date   Depression    DVT (deep venous thrombosis) (HCC)  PE (pulmonary embolism)    Postpartum depression 08/12/2015   Protein S deficiency (HCC)     Past Surgical History:  Procedure Laterality Date   ANKLE SURGERY Left    CESAREAN SECTION N/A 07/17/2016   Procedure: C-Section;  Surgeon:  Mitzie BROCKS Ward, MD;  Location: [facility] ORS;  Service: Obstetrics;  Laterality: N/A;   CHOLECYSTECTOMY N/A 04/23/2017   Procedure: LAPAROSCOPIC CHOLECYSTECTOMY;  Surgeon: Nicholaus Selinda Birmingham, MD;  Location: [facility] ORS;  Service: General;  Laterality: N/A;    Family Psychiatric History: No additional  Family History:  Family History  Problem Relation Age of Onset   Hyperlipidemia Mother    Depression Mother    Heart attack Father    Heart disease Father    Deep vein thrombosis Brother    Diabetes Maternal Grandfather    Bladder Cancer Maternal Grandfather     Social History:  Social History   Socioeconomic History   Marital status: Married    Spouse name: Michelle Hogan   Number of children: Not on file   Years of education: Not on file   Highest education level: Not on file  Occupational History   Not on file  Tobacco Use   Smoking status: Never   Smokeless tobacco: Never  Vaping Use   Vaping status: Never Used  Substance and Sexual Activity   Alcohol use: Not Currently    Alcohol/week: 1.0 standard drink of alcohol    Types: 1 Cans of beer per week    Comment: rarely   Drug use: No   Sexual activity: Yes    Birth control/protection: I.U.D.  Other Topics Concern   Not on file  Social History Narrative   Not on file   Social Drivers of Health   Financial Resource Strain: Low Risk  (09/12/2023)   Received from [facility]   Overall Financial Resource Strain (CARDIA)    Difficulty of Paying Living Expenses: Not hard at all  Food Insecurity: No Food Insecurity (09/12/2023)   Received from [facility]   Hunger Vital Sign    Within the past 12 months, you worried that your food would run out before you got the money to buy more.: Never true    Within the past 12 months, the food you bought just didn't last and you didn't have money to get more.: Never true  Transportation Needs: No Transportation Needs (09/12/2023)   Received from [facility] Transportation    In the past 12 months, has lack of transportation kept you from medical appointments or from getting medications?: No    Lack of Transportation (Non-Medical): No  Physical Activity: Not on file  Stress: Not on file  Social Connections: Not on file    Allergies: No Known Allergies  Metabolic Disorder Labs: Lab Results  Component Value Date   HGBA1C 5.4 05/23/2023   No results found for: PROLACTIN Lab Results  Component Value Date   CHOL 189 05/23/2023   TRIG 167 (A) 05/23/2023   HDL 44 05/23/2023   CHOLHDL 5.4 (H) 07/13/2015   LDLCALC 117 05/23/2023   LDLCALC 154 (H) 07/13/2015   Lab Results  Component Value Date   TSH 2.12 05/23/2023   TSH 1.690 07/13/2015    Therapeutic Level Labs: No results found for: LITHIUM No results found for: VALPROATE No results found for: CBMZ  Current Medications: Current Outpatient Medications  Medication Sig Dispense Refill   atomoxetine  (STRATTERA ) 25 MG capsule Take  1 capsule (25 mg total) by mouth daily. 20 capsule 0   citalopram  (CELEXA ) 20 MG tablet Take 1 tablet (20 mg total) by mouth daily. 30 tablet 3   lisdexamfetamine (VYVANSE ) 40 MG capsule Take 1 capsule (40 mg total) by mouth every morning. 14 capsule 0   rivaroxaban  (XARELTO ) 20 MG TABS tablet Take 1 tablet (20 mg total) by mouth daily with supper. 90 tablet 0   spironolactone (ALDACTONE) 100 MG tablet Take 100 mg by mouth daily.     tirzepatide  (ZEPBOUND ) 12.5 MG/0.5ML Pen Inject 12.5 mg into the skin once a week. 2 mL 1   tretinoin (RETIN-A) 0.025 % cream Apply topically at bedtime.     triamcinolone ointment (KENALOG) 0.1 % Apply 1 Application topically 2 (two) times daily.     No current facility-administered medications for this visit.     Musculoskeletal: Strength & Muscle Tone: within normal limits Gait & Station: normal Patient leans: N/A   Psychiatric Specialty Exam: Review of Systems  Constitutional:  Negative.   HENT: Negative.    Eyes: Negative.   Respiratory: Negative.    Cardiovascular: Negative.   Gastrointestinal: Negative.   Endocrine: Negative.   Genitourinary: Negative.   Musculoskeletal: Negative.   Skin: Negative.   Allergic/Immunologic: Negative.   Neurological: Negative.   Hematological: Negative.   Psychiatric/Behavioral:  Positive for decreased concentration. The patient is nervous/anxious.     There were no vitals taken for this visit.There is no height or weight on file to calculate BMI.  General Appearance: Well Groomed  Eye Contact:  Good  Speech:  Clear and Coherent  Volume:  Normal  Mood:  Anxious and Depressed  Affect:  Appropriate  Thought Process:  Coherent  Orientation:  Full (Time, Place, and Person)  Thought Content: Logical   Suicidal Thoughts:  No  Homicidal Thoughts:  No  Memory:  Immediate;   Good Recent;   Good Remote;   Good  Judgement:  Good  Insight:  Good  Psychomotor Activity:  Normal  Concentration:  Concentration: Fair and Attention Span: Fair  Recall:  Good  Fund of Knowledge: Good  Language: Good  Akathisia:  No  Handed:  Right  AIMS (if indicated):   Assets:  Desire for Improvement Financial Resources/Insurance Housing  ADL's:  Intact  Cognition: WNL  Sleep:  Good   Screenings: GAD-7    Flowsheet Row Office Visit from 10/18/2023 in [facility] Office Visit from 10/09/2023 in [facility] at [facility] Office Visit from 09/05/2023 in [facility] & [facility] at [facility] Office Visit from 07/12/2015 in [facility] Primary Care & [facility] Medicine at [facility]  Total GAD-7 Score 5 8 8 5    PHQ2-9    Flowsheet Row Office Visit from 10/18/2023 in [facility] Office Visit from 10/09/2023 in [facility] at [facility] Office Visit from 09/05/2023 in  [facility] Primary Care & [facility] Medicine at [facility] Office Visit from 07/12/2015 in [facility] Primary Care & Sports Medicine at [facility]  PHQ-2 Total Score 2 2 4 5   PHQ-9 Total Score 11 13 16 13    Flowsheet Row UC from 11/30/2022 in [facility]  ED from 06/20/2021 in [facility] Department at [facility]  C-SSRS RISK CATEGORY No Risk No Risk     Assessment and Plan:  - Diagnosis: Attention deficit  hyperactivity disorder (ADHD), unspecified ADHD type [F90.9]  2. Moderate episode of recurrent major depressive disorder (HCC) [F33.1]   - Progress: Pt reports atomoxetine  is not doing well, and reports that she is not able to sleep well with the medication.  - Risk Factors: Worsening symptoms, Suicide risk  Plan - Medications:  Increase Vyvanse  40mg , for ADHD, unspecified. Considered Wellbutrin but pt reports that Wellbutrin increased cravings.  Continue Celexa  20mg  once daily Recommended supplements - Psychotherapy: Currently in therapy with Michelle Robichaud. - Education: Patient has been educated on how to contact this provider through my chart messaging or by calling the clinic.  Patient has been educated on medications, purpose, side effects, and adverse reactions.  Patient has been also been educated that if she has depressive symptoms she is advised not to have a firearm at home but she states that the firearm is secured and locked away and ammunition is separated with another locked box. - Follow-Up: Patient will follow up in 2 weeks to monitor side effects and symptom management. - Referrals: [facility] for ADHD Testing, currently under testing, estimated completion in 2 weeks.  - Safety Planning: The patient has been educated, if they should have suicidal thoughts with or without a plan to call 911, or go to the closest emergency department.  Pt verbalized understanding.  Pt reports having a firearm in the home but states that the  gun is locked with a trigger guard lock and the ammunition is separated and another lockbox and both are secured in a locked state..  Pt also agrees to call the clinic should they have worsening symptoms before the next appointment.     Patient/Guardian was advised Release of Information must be obtained prior to any record release in order to collaborate their care with an outside provider. Patient/Guardian was advised if they have not already done so to contact the registration department to sign all necessary forms in order for us  to release information regarding their care.   Consent: Patient/Guardian gives verbal consent for treatment and assignment of benefits for services provided during this visit. Patient/Guardian expressed understanding and agreed to proceed.    [name], NP 12/31/2023, 10:59 AM

## 2024-01-02 ENCOUNTER — Ambulatory Visit: Admitting: Psychology

## 2024-01-02 DIAGNOSIS — F89 Unspecified disorder of psychological development: Secondary | ICD-10-CM

## 2024-01-02 DIAGNOSIS — F900 Attention-deficit hyperactivity disorder, predominantly inattentive type: Secondary | ICD-10-CM | POA: Diagnosis not present

## 2024-01-02 DIAGNOSIS — F419 Anxiety disorder, unspecified: Secondary | ICD-10-CM | POA: Diagnosis not present

## 2024-01-02 NOTE — Progress Notes (Signed)
 Date: 01/02/2024   Appointment Start Time: 3pm Duration: 38 minutes Provider: Frederic Fire, PsyD Type of Session: Testing Appointment for Evaluation  Location of Patient: Home Location of Provider: Provider's Home (private office) Type of Contact: Caregility video visit with audio  Session Content: Today's appointment was a telepsychological visit. Michelle Hogan is aware it is her responsibility to secure confidentiality on her end of the session. Prior to proceeding with today's appointment, Michelle Hogan's physical location at the time of this appointment was obtained as well a phone number she could be reached at in the event of technical difficulties. Michelle Hogan denied anyone else being present in the room or on the virtual appointment. This provider reviewed that video should not be captured, photos should not be taken, nor should testing stimuli be copied or recorded as it would be a copyright violation. Michelle Hogan expressed understanding of the aforementioned, and verbally consented to proceed. The WAIS-5 was administered, scored, and interpreted by this evaluator. Michelle Hogan is aware of the limitations of teleheath visits and verbally consented to proceed.  Billing codes will be input on the feedback appointment. There are no billing codes for the testing appointment.   Provisional DSM-5 diagnosis(es):  F89 Unspecified Disorder of Psychological Development   Plan: Michelle Hogan was scheduled for a feedback appointment on 01/03/2024 at 11am via Caregility video visit with audio.                Frederic ONEIDA Fire, PsyD

## 2024-01-09 ENCOUNTER — Encounter: Payer: Self-pay | Admitting: Student

## 2024-01-09 ENCOUNTER — Ambulatory Visit: Admitting: Student

## 2024-01-09 DIAGNOSIS — G4733 Obstructive sleep apnea (adult) (pediatric): Secondary | ICD-10-CM

## 2024-01-09 DIAGNOSIS — F331 Major depressive disorder, recurrent, moderate: Secondary | ICD-10-CM | POA: Diagnosis not present

## 2024-01-09 MED ORDER — RIVAROXABAN 20 MG PO TABS: 20.0000 mg | ORAL_TABLET | Freq: Every day | ORAL | 1 refills | Status: AC

## 2024-01-09 NOTE — Progress Notes (Signed)
 Established Patient Office Visit  Subjective   Patient ID: Michelle Hogan, female    DOB: [birth year]  Age: 38 y.o. MRN: [id number]  Chief Complaint  Patient presents with   Weight Check    Alan JONETTA Dollar with medical hx listed below presents today for follow up of obesity. Started first dose of Zepbound  12.5 mg 5 days ago and tolerating well. Has had about 1 pound weight loss per week since starting zepbound  and is please with progress with weight loss. Feels she should exercising more but having some difficulty with productivity, thinks is related to ADHD, recently had behavioral evaluation regarding this and awaiting results. Will follow up with psychiatry later this month regarding results and medication management.   Patient Active Problem List   Diagnosis Date Noted   Acne vulgaris 10/09/2023   Morbid obesity (HCC) 09/05/2023   OSA (obstructive sleep apnea) 09/05/2023   Moderate episode of recurrent major depressive disorder (HCC) 09/05/2023   Protein S deficiency (HCC) 12/18/2018   Chronic anticoagulation 12/18/2018   History of DVT (deep vein thrombosis) 04/19/2017   Hyperlipidemia 08/12/2015   FH: heart disease 08/12/2015   Vitamin D  deficiency 07/14/2015   Hx of pulmonary embolus 01/27/2015   Past Medical History:  Diagnosis Date   Depression    DVT (deep venous thrombosis) (HCC)    PE (pulmonary embolism)    Postpartum depression 08/12/2015   Protein S deficiency (HCC)       ROS Refer to HPI    Objective:     Outpatient Encounter Medications as of 01/09/2024  Medication Sig   citalopram  (CELEXA ) 20 MG tablet Take 1 tablet (20 mg total) by mouth daily.   lisdexamfetamine (VYVANSE ) 40 MG capsule Take 1 capsule (40 mg total) by mouth every morning.   spironolactone (ALDACTONE) 100 MG tablet Take 100 mg by mouth daily.   tirzepatide  (ZEPBOUND ) 12.5 MG/0.5ML Pen Inject 12.5 mg into the skin once a week.   tretinoin (RETIN-A) 0.025 % cream Apply topically at  bedtime.   [DISCONTINUED] rivaroxaban  (XARELTO ) 20 MG TABS tablet Take 1 tablet (20 mg total) by mouth daily with supper.   rivaroxaban  (XARELTO ) 20 MG TABS tablet Take 1 tablet (20 mg total) by mouth daily with supper.   triamcinolone ointment (KENALOG) 0.1 % Apply 1 Application topically 2 (two) times daily. (Patient not taking: Reported on 01/09/2024)   [DISCONTINUED] atomoxetine  (STRATTERA ) 25 MG capsule Take 1 capsule (25 mg total) by mouth daily. (Patient not taking: Reported on 01/09/2024)   No facility-administered encounter medications on file as of 01/09/2024.    BP 124/82   Pulse 70   Ht 5' 5 (1.651 m)   Wt 240 lb (108.9 kg)   SpO2 97%   BMI 39.94 kg/m  BP Readings from Last 3 Encounters:  01/09/24 124/82  10/09/23 126/82  09/05/23 116/74    Physical Exam     01/09/2024   11:27 AM 10/18/2023   10:05 AM 10/09/2023   10:12 AM  Depression screen PHQ 2/9  Decreased Interest 1 1 1   Down, Depressed, Hopeless 2 1 1   PHQ - 2 Score 3 2 2   Altered sleeping 3 3 3   Tired, decreased energy 3 3 3   Change in appetite 1 1 2   Feeling bad or failure about yourself  1 1 1   Trouble concentrating 2 1 2   Moving slowly or fidgety/restless 2 0 0  Suicidal thoughts 0 0 0  PHQ-9 Score 15 11 13   Difficult doing  work/chores Very difficult  Somewhat difficult       01/09/2024   11:27 AM 10/18/2023   10:06 AM 10/09/2023   10:13 AM 10/09/2023   10:12 AM  GAD 7 : Generalized Anxiety Score  Nervous, Anxious, on Edge 2 1 3 1   Control/stop worrying 1 1 2 1   Worry too much - different things 1 1 1 3   Trouble relaxing 1 0 0 3  Restless 2 0 0 1  Easily annoyed or irritable 1 1 0   Afraid - awful might happen 1 1 2    Total GAD 7 Score 9 5 8    Anxiety Difficulty Somewhat difficult Somewhat difficult Not difficult at all     No results found for any visits on 01/09/24. Last CBC Lab Results  Component Value Date   WBC 7.3 10/19/2023   HGB 13.1 10/19/2023   HCT 40.4 10/19/2023   MCV 89  10/19/2023   MCH 28.9 10/19/2023   RDW 14.2 10/19/2023   PLT 274 10/19/2023   Last metabolic panel Lab Results  Component Value Date   GLUCOSE 84 10/19/2023   NA 136 10/19/2023   K 4.2 10/19/2023   CL 99 10/19/2023   CO2 19 (L) 10/19/2023   BUN 14 10/19/2023   CREATININE 0.77 10/19/2023   EGFR 101 10/19/2023   CALCIUM 9.4 10/19/2023   PROT 6.8 10/19/2023   ALBUMIN 4.5 10/19/2023   LABGLOB 2.3 10/19/2023   AGRATIO 2.4 (H) 07/13/2015   BILITOT 0.3 10/19/2023   ALKPHOS 81 10/19/2023   AST 17 10/19/2023   ALT 18 10/19/2023   ANIONGAP 9 05/25/2022   Last lipids Lab Results  Component Value Date   CHOL 189 05/23/2023   HDL 44 05/23/2023   LDLCALC 117 05/23/2023   TRIG 167 (A) 05/23/2023   CHOLHDL 5.4 (H) 07/13/2015      The ASCVD Risk score (Arnett DK, et al., 2019) failed to calculate for the following reasons:   The 2019 ASCVD risk score is only valid for ages 86 to 45    Assessment & Plan:  Morbid obesity (HCC) Assessment & Plan: Started zepbound  12.5 mg last week and tolerating well, notices some increase in appetitive suppression. 12 lb weight loss since last visit 3 months ago Weight is 240lbs today. Eating fewer sweets, focusing on protein intake, and healthier snacks. Walking about 3K steps a day does not do dedicated physical activity. Made goal for her to start walking and add on strength training a few time a week, she is agreeable to this. Continue zepbound  12.5 mg weekly, she will make follow up in 1-2 months if she feels weight is at plateau    OSA (obstructive sleep apnea) Assessment & Plan: Has not followed up on this encouraged to work on this.    Moderate episode of recurrent major depressive disorder [facility]) Assessment & Plan: PHQ9 elevated to 15 today, feels mood is related to marital difficulties and ADHD, had been doing couples counseling and feels restarting will help. Seeing [facility] for psychiatry and had discussed coming of celexa  if adhd is  better controlled and mood is better. She recently had behavioral testing and is awaiting results. Will defer celexa  management to psychiatry.     Other orders -     Rivaroxaban ; Take 1 tablet (20 mg total) by mouth daily with supper.  Dispense: 90 tablet; Refill: 1     Return in about 4 months (around 05/22/2024) for physical.    Harlene Saddler, MD

## 2024-01-09 NOTE — Progress Notes (Unsigned)
 Testing and Report Writing Information: The following measures  were administered, scored, and interpreted by this provider:  Generalized Anxiety Disorder-7 (GAD-7; 5 minutes), Patient Health Questionnaire-9 (PHQ-9; 5 minutes), Wechsler Adult Intelligence Scale-Fourth Edition (WAIS-5; 70 minutes), CNS Vital Signs (45 minutes), Adult Attention Deficit/Hyperactivity Disorder Self-Report Scale Checklist (ASRSv1.1; 15 minutes), Behavior Rating Inventory for Executive Function - 2A - Self Report (BRIEF 2A; 10 minutes) and Behavior Rating Inventory for Executive Function - 2A - Informant *** (BRIEF-2A; 10 minutes), and Personality Assessment Inventory (PAI; 50 minutes). A total of *** (210 - if no additions and BRIEF-A Informant given *** ) minutes was spent on the administration and scoring of the aforementioned measures. Codes 03863 and [phone] (6 units) were billed.  Please see the assessment for additional details. This provider completed the written report which includes integration of patient data, interpretation of standardized test results, interpretation of clinical data, review of information provided by Alan and any collateral information/documentation, and clinical decision making (*** minutes in total).  Feedback Appointment: Date: 01/10/2024 Appointment Start Time: *** Duration: *** minutes Provider: Frederic Fire, PsyD Type of Session: Feedback Appointment for Evaluation  Location of Patient: {gbptloc:[id number]} Location of Provider: Provider's Home (private office) Type of Contact: Caregility video visit with audio  Session Content: Today's appointment was a telepsychological visit due to COVID-19. Sareen is aware it is her responsibility to secure confidentiality on her end of the session. She provided verbal consent to proceed with today's appointment. Prior to proceeding with today's appointment, Jaelen's physical location at the time of this appointment was obtained as well a phone number she  could be reached at in the event of technical difficulties. Lorriann denied anyone else being present in the room or on the virtual appointment. Rosalena is aware of the limitations of teleheath visits and verbally consented to proceed.  This provider and Elizah completed the interactive feedback session which includes reviewing the aforementioned measures, treatment recommendations, and diagnostic conclusions.   The interactive feedback session was completed today and a total of *** minutes was spent on feedback. Code 03867 was billed for feedback session.   DSM-5 Diagnosis(es): ***  Time Requirements: Assessment scoring and interpreting: *** minutes (billing code 03863 and [phone] [6 units]) Feedback: *** minutes (billing code 03867) Report writing: *** total minutes.  (billing code 03866 [***])  Plan: Eilene provided verbal consent for her evaluation to be sent via e-mail. No further follow-up planned by this provider. ***                 Frederic ONEIDA Fire, PsyD

## 2024-01-09 NOTE — Assessment & Plan Note (Addendum)
 PHQ9 elevated to 15 today, feels mood is related to marital difficulties and ADHD, had been doing couples counseling and feels restarting will help. Seeing [facility] for psychiatry and had discussed coming of celexa  if adhd is better controlled and mood is better. She recently had behavioral testing and is awaiting results. Will defer celexa  management to psychiatry.

## 2024-01-09 NOTE — Assessment & Plan Note (Addendum)
 Started zepbound  12.5 mg last week and tolerating well, notices some increase in appetitive suppression. 12 lb weight loss since last visit 3 months ago Weight is 240lbs today. Eating fewer sweets, focusing on protein intake, and healthier snacks. Walking about 3K steps a day does not do dedicated physical activity. Made goal for her to start walking and add on strength training a few time a week, she is agreeable to this. Continue zepbound  12.5 mg weekly, she will make follow up in 1-2 months if she feels weight is at plateau

## 2024-01-09 NOTE — Assessment & Plan Note (Signed)
 Has not followed up on this encouraged to work on this.

## 2024-01-10 ENCOUNTER — Ambulatory Visit: Admitting: Psychology

## 2024-01-10 DIAGNOSIS — F900 Attention-deficit hyperactivity disorder, predominantly inattentive type: Secondary | ICD-10-CM

## 2024-01-13 ENCOUNTER — Other Ambulatory Visit: Payer: Self-pay | Admitting: Student

## 2024-01-14 NOTE — Telephone Encounter (Signed)
 Requested Prescriptions  Pending Prescriptions Disp Refills   citalopram  (CELEXA ) 20 MG tablet [Pharmacy Med Name: CITALOPRAM  HBR 20 MG TABLET] 90 tablet 1    Sig: TAKE 1 TABLET BY MOUTH EVERY DAY     Psychiatry:  Antidepressants - SSRI Passed - 01/14/2024  4:07 PM      Passed - Completed PHQ-2 or PHQ-9 in the last 360 days      Passed - Valid encounter within last 6 months    Recent Outpatient Visits           5 days ago Morbid obesity [facility])   [place] [facility] at [facility], MD   3 months ago Morbid obesity [facility])   [place] [facility] at [facility], Har[facility], MD   4 months ago Moderate episode of recurrent major depressive disorder [facility])   [place] [facility] at [facility], MD

## 2024-01-15 DIAGNOSIS — F900 Attention-deficit hyperactivity disorder, predominantly inattentive type: Secondary | ICD-10-CM | POA: Diagnosis not present

## 2024-01-15 DIAGNOSIS — F419 Anxiety disorder, unspecified: Secondary | ICD-10-CM | POA: Diagnosis not present

## 2024-01-16 ENCOUNTER — Other Ambulatory Visit: Payer: Self-pay

## 2024-01-16 ENCOUNTER — Ambulatory Visit: Admitting: Psychiatry

## 2024-01-16 ENCOUNTER — Ambulatory Visit
Admission: RE | Admit: 2024-01-16 | Discharge: 2024-01-16 | Disposition: A | Discharge status: Home / Self Care | Source: Ambulatory Visit | Attending: Student | Admitting: Student

## 2024-01-16 ENCOUNTER — Encounter: Payer: Self-pay | Admitting: Psychiatry

## 2024-01-16 VITALS — BP 98/64 | HR 79 | Temp 97.2°F | Ht 65.0 in | Wt 235.2 lb

## 2024-01-16 DIAGNOSIS — F900 Attention-deficit hyperactivity disorder, predominantly inattentive type: Secondary | ICD-10-CM | POA: Diagnosis not present

## 2024-01-16 DIAGNOSIS — Z5181 Encounter for therapeutic drug level monitoring: Secondary | ICD-10-CM | POA: Diagnosis not present

## 2024-01-16 DIAGNOSIS — F331 Major depressive disorder, recurrent, moderate: Secondary | ICD-10-CM | POA: Diagnosis not present

## 2024-01-16 MED ORDER — LISDEXAMFETAMINE DIMESYLATE 50 MG PO CAPS: 50.0000 mg | ORAL_CAPSULE | Freq: Every day | ORAL | 0 refills | Status: DC

## 2024-01-16 NOTE — Progress Notes (Signed)
 [facility] MD/PA/NP OP Progress Note  01/16/2024 9:15 AM Michelle Hogan  MRN:  [id number]  Chief Complaint: Routine Follow-up  HPI: 38 year old female presenting [facility] for follow-up.  Patient reports that she is in a good mood as she has found out about her ADHD testing results in which she was diagnosed by Dr. Frederic Fire that she has ADHD predominantly inattentive type, moderate.  Patient reports that this is validating and she feels much better knowing that this has been an issue that she has been struggling with and states that she is happy with the current help she has gotten in the help she is looking forward to.  Patient reports that currently the Vyvanse  40 mg is not meeting expectations and states she can tell a slight difference but is not really making a impact on her executive functioning.  Patient reports that she would like to go up on the medication.  Patient has been educated that this medications purpose is not to perform better or faster but to utilize executive functioning in which she verbalized agreement.  Patient has agreed that she will also get EKG testing due to the dosage of the medication.  EKG order placed and has been sent to the hospital.  Based on this assessment interview is recommended for patient to increase Vyvanse  to 50 mg once daily for 1 month.  Patient has been instructed that that increase of dose is good to slow down as the patient is coming to higher doses of Vyvanse .  Patient verbalized understanding.  Patient with no other questions or concerns at this time.  Patient is in agreement with treatment plan.  Patient to follow-up in 1 month.  Visit Diagnosis:    ICD-10-CM   1. 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 Moderate episode of recurrent major depressive disorder (HCC)  F33.1       Past Psychiatric History:  Previous Psych Hospitalizations: -Denies Outpatient treatment:  -Current PCP Harlene Saddler,  MD Medications Current: - Vyvanse  30mg  once daily - Celexa  20 mg once daily Next Steps: - Optimize atomoxetine  dosage, the patient is waiting for psychiatric testing referrals [facility] Medication Trials: - No medication trials Suicide & Violence: - Last episode of suicidal ideation with passive thoughts reported 6 months ago -Currently denies SI, HI, AVH -Reports that emotional distress with husband usually causes SI thoughts Substance Use: - Denies Psychotherapy: - Currently participating in psychotherapy with Selinda Robichaud LCSW Legal:  - Denies  Past Medical History:  Past Medical History:  Diagnosis Date   Depression    DVT (deep venous thrombosis) (HCC)    PE (pulmonary embolism)    Postpartum depression 08/12/2015   Protein S deficiency     Past Surgical History:  Procedure Laterality Date   ANKLE SURGERY Left    CESAREAN SECTION N/A 07/17/2016   Procedure: C-Section;  Surgeon: Mitzie BROCKS Ward, MD;  Location: [facility] ORS;  Service: Obstetrics;  Laterality: N/A;   CHOLECYSTECTOMY N/A 04/23/2017   Procedure: LAPAROSCOPIC CHOLECYSTECTOMY;  Surgeon: Nicholaus Selinda Birmingham, MD;  Location: [facility] ORS;  Service: General;  Laterality: N/A;    Family Psychiatric History: No additional  Family History:  Family History  Problem Relation Age of Onset   Hyperlipidemia Mother    Depression Mother    Heart attack Father    Heart disease Father    Deep vein thrombosis Brother    Diabetes Maternal Grandfather    Bladder Cancer Maternal Grandfather  Social History:  Social History   Socioeconomic History   Marital status: Married    Spouse name: [name]   Number of children: Not on file   Years of education: Not on file   Highest education level: Not on file  Occupational History   Not on file  Tobacco Use   Smoking status: Never   Smokeless tobacco: Never  Vaping Use   Vaping status: Never Used  Substance and Sexual Activity   Alcohol use: Not Currently     Alcohol/week: 1.0 standard drink of alcohol    Types: 1 Cans of beer per week    Comment: rarely   Drug use: No   Sexual activity: Yes    Birth control/protection: I.U.D.  Other Topics Concern   Not on file  Social History Narrative   Not on file   Social Drivers of Health   Financial Resource Strain: Low Risk  (09/12/2023)   Received from [facility]   Overall Financial Resource Strain (CARDIA)    Difficulty of Paying Living Expenses: Not hard at all  Food Insecurity: No Food Insecurity (09/12/2023)   [facility] from [facility]   Hunger Vital Sign    Within the past 12 months, you worried that your food would run out before you got the money to buy more.: Never true    Within the past 12 months, the food you bought just didn't last and you didn't have money to get more.: Never true  Transportation Needs: No Transportation Needs (09/12/2023)   Received from [facility] - Transportation    In the past 12 months, has lack of transportation kept you from medical appointments or from getting medications?: No    Lack of Transportation (Non-Medical): No  Physical Activity: Not on file  Stress: Not on file  Social Connections: Not on file    Allergies: No Known Allergies  Metabolic Disorder Labs: Lab Results  Component Value Date   HGBA1C 5.4 05/23/2023   No results found for: PROLACTIN Lab Results  Component Value Date   CHOL 189 05/23/2023   TRIG 167 (A) 05/23/2023   HDL 44 05/23/2023   CHOLHDL 5.4 (H) 07/13/2015   LDLCALC 117 05/23/2023   LDLCALC 154 (H) 07/13/2015   Lab Results  Component Value Date   TSH 2.12 05/23/2023   TSH 1.690 07/13/2015    Therapeutic Level Labs: No results found for: LITHIUM No results found for: VALPROATE No results found for: CBMZ  Current Medications: Current Outpatient Medications  Medication Sig Dispense Refill   citalopram  (CELEXA ) 20 MG tablet TAKE 1 TABLET  BY MOUTH EVERY DAY 90 tablet 1   lisdexamfetamine (VYVANSE ) 40 MG capsule Take 1 capsule (40 mg total) by mouth every morning. 20 capsule 0   rivaroxaban  (XARELTO ) 20 MG TABS tablet Take 1 tablet (20 mg total) by mouth daily with supper. 90 tablet 1   spironolactone (ALDACTONE) 100 MG tablet Take 100 mg by mouth daily.     tirzepatide  (ZEPBOUND ) 12.5 MG/0.5ML Pen Inject 12.5 mg into the skin once a week. 2 mL 1   tretinoin (RETIN-A) 0.025 % cream Apply topically at bedtime.     triamcinolone ointment (KENALOG) 0.1 % Apply 1 Application topically 2 (two) times daily. (Patient not taking: Reported on 01/09/2024)     No current facility-administered medications for this visit.     Musculoskeletal: Strength & Muscle Tone: within normal limits Gait & Station: normal Patient leans: N/A  Psychiatric Specialty Exam: Review of Systems  Constitutional: Negative.   HENT: Negative.    Eyes: Negative.   Respiratory: Negative.    Cardiovascular: Negative.   Gastrointestinal: Negative.   Endocrine: Negative.   Genitourinary: Negative.   Musculoskeletal: Negative.   Skin: Negative.   Allergic/Immunologic: Negative.   Neurological: Negative.   Hematological: Negative.   Psychiatric/Behavioral:  Positive for decreased concentration. The patient is nervous/anxious.      There were no vitals taken for this visit.There is no height or weight on file to calculate BMI.  General Appearance: Well Groomed  Eye Contact:  Good  Speech:  Clear and Coherent  Volume:  Normal  Mood:  Anxious and Depressed  Affect:  Appropriate  Thought Process:  Coherent  Orientation:  Full (Time, Place, and Person)  Thought Content: Logical   Suicidal Thoughts:  No  Homicidal Thoughts:  No  Memory:  Immediate;   Good Recent;   Good Remote;   Good  Judgement:  Good  Insight:  Good  Psychomotor Activity:  Normal  Concentration:  Concentration: Fair and Attention Span: Fair  Recall:  Good  Fund of Knowledge: Good   Language: Good  Akathisia:  No  Handed:  Right  AIMS (if indicated):   Assets:  Desire for Improvement Financial Resources/Insurance Housing  ADL's:  Intact  Cognition: WNL  Sleep:  Good   Screenings: GAD-7    Flowsheet Row Office Visit from 01/09/2024 in [facility] at [facility] Office Visit from 10/18/2023 in [facility] Office Visit from 10/09/2023 in [facility] at [facility] Office Visit from 09/05/2023 in [facility] Care & [facility] Medicine at [facility] Office Visit from 07/12/2015 in [facility] Primary Care & Sports Medicine at [facility]  Total GAD-7 Score 9 5 8 8 5    PHQ2-9    Flowsheet Row Office Visit from 01/09/2024 in [facility] Primary Care & Sports Medicine at [facility] Office Visit from 10/18/2023 in [facility] Office Visit from 10/09/2023 in [facility] Care & [facility] Medicine at [facility] Office Visit from 09/05/2023 in [facility] Primary Care & Sports Medicine at [facility] Office Visit from 07/12/2015 in [facility] at [facility]  PHQ-2 Total Score 3 2 2 4 5   PHQ-9 Total Score 15 11 13 16 13    Flowsheet Row UC from 11/30/2022 in [facility] at [facility]  ED from 06/20/2021 in [facility] at [facility]  C-SSRS RISK CATEGORY No Risk No Risk     Assessment and Plan:  - Diagnosis: Attention deficit hyperactivity disorder (ADHD), unspecified ADHD type [F90.9]  2. Moderate episode of recurrent major depressive disorder (HCC) [F33.1]   - Progress: Pt reports atomoxetine  is not doing well, and reports that she is not able to sleep well with the medication.  - Risk Factors: Worsening symptoms, Suicide risk  Plan - Medications:  Increase Vyvanse  50mg , for ADHD, unspecified.  Continue Celexa  20mg  once  daily Recommended supplements - Psychotherapy: Currently in therapy with Jason Robichaud. - Education: Patient has been educated on how to contact this provider through my chart messaging or by calling the clinic.  Patient has been educated on medications, purpose, side effects, and adverse reactions.  Patient has been also been educated that if she has depressive symptoms she is advised not to have a firearm  at home but she states that the firearm is secured and locked away and ammunition is separated with another locked box. - Follow-Up: Patient will follow up in 2 weeks to monitor side effects and symptom management. - Referrals: [facility] for ADHD Testing, currently under testing, estimated completion in 2 weeks.  - Safety Planning: The patient has been educated, if they should have suicidal thoughts with or without a plan to call 911, or go to the closest emergency department.  Pt verbalized understanding.  Pt reports having a firearm in the home but states that the gun is locked with a trigger guard lock and the ammunition is separated and another lockbox and both are secured in a locked state..  Pt also agrees to call the clinic should they have worsening symptoms before the next appointment.    Patient/Guardian was advised Release of Information must be obtained prior to any record release in order to collaborate their care with an outside provider. Patient/Guardian was advised if they have not already done so to contact the registration department to sign all necessary forms in order for us  to release information regarding their care.   Consent: Patient/Guardian gives verbal consent for treatment and assignment of benefits for services provided during this visit. Patient/Guardian expressed understanding and agreed to proceed.    [name], NP 01/16/2024, 9:15 AM

## 2024-02-07 DIAGNOSIS — L814 Other melanin hyperpigmentation: Secondary | ICD-10-CM | POA: Diagnosis not present

## 2024-02-07 DIAGNOSIS — D225 Melanocytic nevi of trunk: Secondary | ICD-10-CM | POA: Diagnosis not present

## 2024-02-07 DIAGNOSIS — D2261 Melanocytic nevi of right upper limb, including shoulder: Secondary | ICD-10-CM | POA: Diagnosis not present

## 2024-02-07 DIAGNOSIS — L700 Acne vulgaris: Secondary | ICD-10-CM | POA: Diagnosis not present

## 2024-02-15 ENCOUNTER — Telehealth: Admitting: Psychiatry

## 2024-02-15 DIAGNOSIS — F900 Attention-deficit hyperactivity disorder, predominantly inattentive type: Secondary | ICD-10-CM

## 2024-02-15 DIAGNOSIS — F331 Major depressive disorder, recurrent, moderate: Secondary | ICD-10-CM | POA: Diagnosis not present

## 2024-02-15 MED ORDER — LISDEXAMFETAMINE DIMESYLATE 50 MG PO CAPS: 50.0000 mg | ORAL_CAPSULE | Freq: Every day | ORAL | 0 refills | Status: DC

## 2024-02-15 NOTE — Progress Notes (Signed)
 BH Michelle Hogan/PA/NP OP Progress Note  02/15/2024 8:57 AM Michelle Hogan  MRN:  [id number]  Chief Complaint: Routine Follow-up  Virtual Visit via Video Note  I connected with Michelle Hogan on 02/15/24 at  9:00 AM EDT by a video enabled telemedicine application and verified that I am speaking with the correct person using two identifiers.  Location: Patient: [address]  [facility] [place] [address]  Provider: [address] , [place] [address]   I discussed the limitations of evaluation and management by telemedicine and the availability of in person appointments. The patient expressed understanding and agreed to proceed.    I discussed the assessment and treatment plan with the patient. The patient was provided an opportunity to ask questions and all were answered. The patient agreed with the plan and demonstrated an understanding of the instructions.   The patient was advised to call back or seek an in-person evaluation if the symptoms worsen or if the condition fails to improve as anticipated.  I provided 30 minutes of non-face-to-face time during this encounter.   [name], NP   HPI: 38 year old female presenting to [facility] for follow-up.  Patient reports that she is doing much better on 50 mg of Vyvanse  once daily with more concentration more focus as well as more clarity.  Patient states she is more both motivated to get stuff done stating that she is able to understand and listen intently to people now and stating that she has now gotten her office completely cleaned up due to the poor executive function in the past she reports.  Patient also reports that her mood has improved greatly and has been noted by the patient's husband she reports.  Patient states that the medication this is the first time she is feeling the clarity and states she is happy with the dose but states that her appetite has been suppressed greatly and states she is gena go to her medical doctor to see if  she can get her GLP-1 reduced due to the significant suppression of appetite.  Patient reports she is satisfied with current medication management and knows signs of side effects other than appetite suppression and states that she is satisfied current weight.  Patient encouraged to continue the current dose stating that we should see if this is a good dose that she could tolerate and to keep it minimal as we do not want to maximize on medications with no way to improve if needed later.  Patient verbalized understanding and agreement.  Based on this assessment interview is recommended for the patient to continue medication management see plan.  Patient with no other questions or concerns.  Patient is in agreement with treatment plan.  Patient denies SI, HI, AVH.  Patient to follow-up in 1 month. Visit Diagnosis:    ICD-10-CM   1. 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 Moderate episode of recurrent major depressive disorder (HCC)  F33.1       Past Psychiatric History:  Previous Psych Hospitalizations: -Denies Outpatient treatment:  -Current PCP Michelle Hogan, Michelle Hogan Medications Current: - Vyvanse  50mg  once daily - Celexa  20 mg once daily Next Steps: - Continue stimulant dosage, the patient is waiting for psychiatric testing referrals [facility] Medication Trials: - No medication trials Suicide & Violence: - Last episode of suicidal ideation with passive thoughts reported 6 months ago -Currently denies SI, HI, AVH -Reports that emotional distress with husband usually causes SI  thoughts Substance Use: - Denies Psychotherapy: - Currently participating in psychotherapy with Selinda Robichaud LCSW Legal:  - Denies  Past Medical History:  Past Medical History:  Diagnosis Date   Depression    DVT (deep venous thrombosis) (HCC)    PE (pulmonary embolism)    Postpartum depression 08/12/2015   Protein S deficiency     Past Surgical History:   Procedure Laterality Date   ANKLE SURGERY Left    CESAREAN SECTION N/A 07/17/2016   Procedure: C-Section;  Surgeon: Mitzie BROCKS Ward, Michelle Hogan;  Location: [facility] ORS;  Service: Obstetrics;  Laterality: N/A;   CHOLECYSTECTOMY N/A 04/23/2017   Procedure: LAPAROSCOPIC CHOLECYSTECTOMY;  Surgeon: Nicholaus Selinda Birmingham, Michelle Hogan;  Location: [facility] ORS;  Service: General;  Laterality: N/A;    Family Psychiatric History: No additional  Family History:  Family History  Problem Relation Age of Onset   Hyperlipidemia Mother    Depression Mother    Heart attack Father    Heart disease Father    Deep vein thrombosis Brother    Diabetes Maternal Grandfather    Bladder Cancer Maternal Grandfather     Social History:  Social History   Socioeconomic History   Marital status: Married    Spouse name: [name]   Number of children: Not on file   Years of education: Not on file   Highest education level: Not on file  Occupational History   Not on file  Tobacco Use   Smoking status: Never   Smokeless tobacco: Never  Vaping Use   Vaping status: Never Used  Substance and Sexual Activity   Alcohol use: Not Currently    Alcohol/week: 1.0 standard drink of alcohol    Types: 1 Cans of beer per week    Comment: rarely   Drug use: No   Sexual activity: Yes    Birth control/protection: I.U.D.  Other Topics Concern   Not on file  Social History Narrative   Not on file   Social Drivers of Health   Financial Resource Strain: Low Risk  (09/12/2023)   Received from [facility]   Overall Financial Resource Strain (CARDIA)    Difficulty of Paying Living Expenses: Not hard at all  Food Insecurity: No Food Insecurity (09/12/2023)   Received from [facility]   Hunger Vital Sign    Within the past 12 months, you worried that your food would run out before you got the money to buy more.: Never true    Within the past 12 months, the food you bought just didn't last and you didn't have money  to get more.: Never true  Transportation Needs: No Transportation Needs (09/12/2023)   Received from [facility] - Transportation    In the past 12 months, has lack of transportation kept you from medical appointments or from getting medications?: No    Lack of Transportation (Non-Medical): No  Physical Activity: Not on file  Stress: Not on file  Social Connections: Not on file    Allergies: No Known Allergies  Metabolic Disorder Labs: Lab Results  Component Value Date   HGBA1C 5.4 05/23/2023   No results found for: PROLACTIN Lab Results  Component Value Date   CHOL 189 05/23/2023   TRIG 167 (A) 05/23/2023   HDL 44 05/23/2023   CHOLHDL 5.4 (H) 07/13/2015   LDLCALC 117 05/23/2023   LDLCALC 154 (H) 07/13/2015   Lab Results  Component Value Date   TSH 2.12 05/23/2023  TSH 1.690 07/13/2015    Therapeutic Level Labs: No results found for: LITHIUM No results found for: VALPROATE No results found for: CBMZ  Current Medications: Current Outpatient Medications  Medication Sig Dispense Refill   citalopram  (CELEXA ) 20 MG tablet TAKE 1 TABLET BY MOUTH EVERY DAY 90 tablet 1   lisdexamfetamine (VYVANSE ) 50 MG capsule Take 1 capsule (50 mg total) by mouth daily. 30 capsule 0   rivaroxaban  (XARELTO ) 20 MG TABS tablet Take 1 tablet (20 mg total) by mouth daily with supper. 90 tablet 1   spironolactone (ALDACTONE) 100 MG tablet Take 100 mg by mouth daily.     tirzepatide  (ZEPBOUND ) 12.5 MG/0.5ML Pen Inject 12.5 mg into the skin once a week. 2 mL 1   tretinoin (RETIN-A) 0.025 % cream Apply topically at bedtime.     triamcinolone ointment (KENALOG) 0.1 % Apply 1 Application topically 2 (two) times daily. (Patient not taking: Reported on 01/09/2024)     No current facility-administered medications for this visit.     Musculoskeletal: Strength & Muscle Tone: within normal limits Gait & Station: normal Patient leans: N/A   Psychiatric Specialty  Exam: Review of Systems  Constitutional: Negative.   HENT: Negative.    Eyes: Negative.   Respiratory: Negative.    Cardiovascular: Negative.   Gastrointestinal: Negative.   Endocrine: Negative.   Genitourinary: Negative.   Musculoskeletal: Negative.   Skin: Negative.   Allergic/Immunologic: Negative.   Neurological: Negative.   Hematological: Negative.   Psychiatric/Behavioral:  Positive for decreased concentration. The patient is nervous/anxious.      There were no vitals taken for this visit.There is no height or weight on file to calculate BMI.  General Appearance: Well Groomed  Eye Contact:  Good  Speech:  Clear and Coherent  Volume:  Normal  Mood:  Anxious and Depressed  Affect:  Appropriate  Thought Process:  Coherent  Orientation:  Full (Time, Place, and Person)  Thought Content: Logical   Suicidal Thoughts:  No  Homicidal Thoughts:  No  Memory:  Immediate;   Good Recent;   Good Remote;   Good  Judgement:  Good  Insight:  Good  Psychomotor Activity:  Normal  Concentration:  Concentration: Fair and Attention Span: Fair  Recall:  Good  Fund of Knowledge: Good  Language: Good  Akathisia:  No  Handed:  Right  AIMS (if indicated):   Assets:  Desire for Improvement Financial Resources/Insurance Housing  ADL's:  Intact  Cognition: WNL  Sleep:  Good   Screenings: GAD-7    Flowsheet Row Office Visit from 01/16/2024 in [facility] Office Visit from 01/09/2024 in [facility] Office Visit from 10/18/2023 in [facility] Office Visit from 10[facility]09/2023 in [facility] at [facility] Office Visit from 09/05/2023 in [facility] at [facility]  Total GAD-7 Score 10 9 5 8 8    PHQ2-9    Flowsheet Row Office Visit from 01/16/2024 in [facility] Office Visit from 01/09/2024 in [facility] at [facility] Office Visit from 10/18/2023 in [facility] Office Visit from 10/09/2023 in [facility] at [facility] Office Visit from 09/05/2023 in [facility] at [facility] Total Score 2 3 2 2 4   PHQ-9  Total Score -- 15 11 13 16    Flowsheet Row UC from 11/30/2022 in [facility] at [facility]  ED from 06/20/2021 in [facility] Department at [facility]  C-SSRS RISK CATEGORY No Risk No Risk     Assessment and Plan:  - Diagnosis: Attention deficit hyperactivity disorder (ADHD), unspecified ADHD type [F90.9]  2. Moderate episode of recurrent major depressive disorder (HCC) [F33.1]   - Risk Factors: Worsening symptoms, Suicide risk  Plan - Medications:  Continue Vyvanse  50mg , for ADHD, unspecified.  Continue Celexa  20mg  once daily Recommended supplements - Psychotherapy: Currently in therapy with Jason Robichaud. - Education: Patient has been educated on how to contact this provider through my chart messaging or by calling the clinic.  Patient has been educated on medications, purpose, side effects, and adverse reactions.  Patient has been also been educated that if she has depressive symptoms she is advised not to have a firearm at home but she states that the firearm is secured and locked away and ammunition is separated with another locked box. - Follow-Up: Patient will follow up in 2 weeks to monitor side effects and symptom management. - Referrals: [facility] for ADHD Testing, currently under testing, estimated completion in 2 weeks.  - Safety Planning: The patient has been educated, if they should have suicidal thoughts with or without a plan to call 911, or go to the closest emergency department.  Pt verbalized understanding.  Pt reports having a firearm in  the home but states that the gun is locked with a trigger guard lock and the ammunition is separated and another lockbox and both are secured in a locked state..  Pt also agrees to call the clinic should they have worsening symptoms before the next appointment.   Patient/Guardian was advised Release of Information must be obtained prior to any record release in order to collaborate their care with an outside provider. Patient/Guardian was advised if they have not already done so to contact the registration department to sign all necessary forms in order for us  to release information regarding their care.   Consent: Patient/Guardian gives verbal consent for treatment and assignment of benefits for services provided during this visit. Patient/Guardian expressed understanding and agreed to proceed.    [name], NP 02/15/2024, 8:57 AM

## 2024-02-26 ENCOUNTER — Ambulatory Visit: Admitting: Student

## 2024-02-26 ENCOUNTER — Encounter: Payer: Self-pay | Admitting: Student

## 2024-02-26 DIAGNOSIS — F331 Major depressive disorder, recurrent, moderate: Secondary | ICD-10-CM

## 2024-02-26 DIAGNOSIS — Z6837 Body mass index (BMI) 37.0-37.9, adult: Secondary | ICD-10-CM

## 2024-02-26 MED ORDER — ZEPBOUND 10 MG/0.5ML ~~LOC~~ SOAJ: 10.0000 mg | SUBCUTANEOUS | 1 refills | Status: DC

## 2024-02-26 NOTE — Assessment & Plan Note (Addendum)
 Vyvanse  increased to 50 mg about 1 month ago noticed decrease in appetite and not eating much, reports occasional dizziness when she has not eaten that improves with eating and drinking. Weight is down to 222 from 240lbs in the last 2 months. Decrease Zepound to 10 mg weekly.

## 2024-02-26 NOTE — Assessment & Plan Note (Addendum)
 Mood is improved on vyvanse . Would like to trial off celexa . Decrease celexa  to 10 mg daily for 2 weeks and discontinue. Can resume 20 mg daily and follow up if depression worsens.

## 2024-02-26 NOTE — Progress Notes (Signed)
 Established Patient Office Visit  Subjective   Patient ID: Michelle Hogan, female    DOB: 1985/09/17  Age: 38 y.o. MRN: [id number]  Chief Complaint  Patient presents with   Weight Check    Michelle Hogan with medical hx listed below presents today for follow up of obesity.   Mood has been more outgoing a social for the past couple of months since starting vyvanse . Does not feels vyvanse  is significantly affecting her sleep. Does feel appetite is too suppressed with vyvanse .   Patient Active Problem List   Diagnosis Date Noted   Acne vulgaris 10/09/2023   Morbid obesity (HCC) 09/05/2023   OSA (obstructive sleep apnea) 09/05/2023   Moderate episode of recurrent major depressive disorder (HCC) 09/05/2023   Protein S deficiency 12/18/2018   Chronic anticoagulation 12/18/2018   History of DVT (deep vein thrombosis) 04/19/2017   Hyperlipidemia 08/12/2015   FH: heart disease 08/12/2015   Hx of pulmonary embolus 01/27/2015      ROS Refer to HPI    Objective:     Outpatient Encounter Medications as of 02/26/2024  Medication Sig   tirzepatide  (ZEPBOUND ) 10 MG/0.5ML Pen Inject 10 mg into the skin once a week.   citalopram  (CELEXA ) 20 MG tablet TAKE 1 TABLET BY MOUTH EVERY DAY   lisdexamfetamine (VYVANSE ) 50 MG capsule Take 1 capsule (50 mg total) by mouth daily.   rivaroxaban  (XARELTO ) 20 MG TABS tablet Take 1 tablet (20 mg total) by mouth daily with supper.   spironolactone (ALDACTONE) 100 MG tablet Take 100 mg by mouth daily.   tretinoin (RETIN-A) 0.025 % cream Apply topically at bedtime.   triamcinolone ointment (KENALOG) 0.1 % Apply 1 Application topically 2 (two) times daily. (Patient not taking: Reported on 01/09/2024)   [DISCONTINUED] tirzepatide  (ZEPBOUND ) 12.5 MG/0.5ML Pen Inject 12.5 mg into the skin once a week.   No facility-administered encounter medications on file as of 02/26/2024.    BP 122/80   Pulse 81   Ht 5' 5 (1.651 m)   Wt 222 lb 8 oz (100.9 kg)    LMP 01/26/2024 (Approximate)   SpO2 96%   BMI 37.03 kg/m  BP Readings from Last 3 Encounters:  02/26/24 122/80  01/16/24 98/64  01/09/24 124/82    Physical Exam Constitutional:      Appearance: Normal appearance. She is obese.  HENT:     Head: Normocephalic and atraumatic.     Mouth/Throat:     Mouth: Mucous membranes are moist.     Pharynx: Oropharynx is clear.  Eyes:     Extraocular Movements: Extraocular movements intact.     Pupils: Pupils are equal, round, and reactive to light.  Cardiovascular:     Rate and Rhythm: Normal rate and regular rhythm.  Pulmonary:     Effort: Pulmonary effort is normal.     Breath sounds: No rhonchi or rales.  Abdominal:     General: Abdomen is flat. Bowel sounds are normal. There is no distension.     Palpations: Abdomen is soft.     Tenderness: There is no abdominal tenderness.  Musculoskeletal:        General: Normal range of motion.     Right lower leg: No edema.     Left lower leg: No edema.  Skin:    General: Skin is warm and dry.     Capillary Refill: Capillary refill takes less than 2 seconds.  Neurological:     General: No focal deficit present.  Mental Status: She is alert and oriented to person, place, and time.  Psychiatric:        Mood and Affect: Mood normal.        Behavior: Behavior normal.        01/16/2024   10:27 AM 01/09/2024   11:27 AM 10/18/2023   10:05 AM  Depression screen PHQ 2/9  Decreased Interest 1 1 1   Down, Depressed, Hopeless 1 2 1   PHQ - 2 Score 2 3 2   Altered sleeping  3 3  Tired, decreased energy  3 3  Change in appetite  1 1  Feeling bad or failure about yourself   1 1  Trouble concentrating  2 1  Moving slowly or fidgety/restless  2 0  Suicidal thoughts  0 0  PHQ-9 Score  15 11  Difficult doing work/chores  Very difficult        01/16/2024   10:28 AM 01/09/2024   11:27 AM 10/18/2023   10:06 AM 10/09/2023   10:13 AM  GAD 7 : Generalized Anxiety Score  Nervous, Anxious, on Edge 2 2  1 3   Control/stop worrying 1 1 1 2   Worry too much - different things 2 1 1 1   Trouble relaxing 1 1 0 0  Restless 2 2 0 0  Easily annoyed or irritable 1 1 1  0  Afraid - awful might happen 1 1 1 2   Total GAD 7 Score 10 9 5 8   Anxiety Difficulty Somewhat difficult Somewhat difficult Somewhat difficult Not difficult at all    No results found for any visits on 02/26/24.  Last CBC Lab Results  Component Value Date   WBC 7.3 10/19/2023   HGB 13.1 10/19/2023   HCT 40.4 10/19/2023   MCV 89 10/19/2023   MCH 28.9 10/19/2023   RDW 14.2 10/19/2023   PLT 274 10/19/2023   Last metabolic panel Lab Results  Component Value Date   GLUCOSE 84 10/19/2023   NA 136 10/19/2023   K 4.2 10/19/2023   CL 99 10/19/2023   CO2 19 (L) 10/19/2023   BUN 14 10/19/2023   CREATININE 0.77 10/19/2023   EGFR 101 10/19/2023   CALCIUM 9.4 10/19/2023   PROT 6.8 10/19/2023   ALBUMIN 4.5 10/19/2023   LABGLOB 2.3 10/19/2023   AGRATIO 2.4 (H) 07/13/2015   BILITOT 0.3 10/19/2023   ALKPHOS 81 10/19/2023   AST 17 10/19/2023   ALT 18 10/19/2023   ANIONGAP 9 05/25/2022   Last lipids Lab Results  Component Value Date   CHOL 189 05/23/2023   HDL 44 05/23/2023   LDLCALC 117 05/23/2023   TRIG 167 (A) 05/23/2023   CHOLHDL 5.4 (H) 07/13/2015   Last hemoglobin A1c Lab Results  Component Value Date   HGBA1C 5.4 05/23/2023      The ASCVD Risk score (Arnett DK, et al., 2019) failed to calculate for the following reasons:   The 2019 ASCVD risk score is only valid for ages 66 to 50    Assessment & Plan:  Morbid obesity (HCC) Assessment & Plan: Vyvanse  increased to 50 mg about 1 month ago noticed decrease in appetite and not eating much, reports occasional dizziness when she has not eaten that improves with eating and drinking. Weight is down to 222 from 240lbs in the last 2 months. Decrease Zepound to 10 mg weekly.   Moderate episode of recurrent major depressive disorder [facility]) Assessment & Plan: Mood  is improved on vyvanse . Would like to trial off celexa . Decrease celexa   to 10 mg daily for 2 weeks and discontinue. Can resume 20 mg daily and follow up if depression worsens.    BMI 37.0-37.9, adult  Other orders -     Zepbound ; Inject 10 mg into the skin once a week.  Dispense: 6 mL; Refill: 1     No follow-ups on file.    Harlene Saddler, MD

## 2024-02-27 DIAGNOSIS — F900 Attention-deficit hyperactivity disorder, predominantly inattentive type: Secondary | ICD-10-CM | POA: Diagnosis not present

## 2024-02-27 DIAGNOSIS — F419 Anxiety disorder, unspecified: Secondary | ICD-10-CM | POA: Diagnosis not present

## 2024-03-14 ENCOUNTER — Telehealth (INDEPENDENT_AMBULATORY_CARE_PROVIDER_SITE_OTHER): Admitting: Psychiatry

## 2024-03-14 DIAGNOSIS — F331 Major depressive disorder, recurrent, moderate: Secondary | ICD-10-CM

## 2024-03-14 DIAGNOSIS — F900 Attention-deficit hyperactivity disorder, predominantly inattentive type: Secondary | ICD-10-CM

## 2024-03-14 MED ORDER — LISDEXAMFETAMINE DIMESYLATE 50 MG PO CAPS: 50.0000 mg | ORAL_CAPSULE | Freq: Every day | ORAL | 0 refills | Status: AC

## 2024-03-14 NOTE — Progress Notes (Signed)
 [facility] MD/PA/NP OP Progress Note  03/14/2024 9:34 AM Michelle Hogan  MRN:  [id number]  Chief Complaint: Routine follow-up Virtual Visit via Video Note  I connected with Michelle Hogan on 03/14/24 at  9:30 AM EST by a video enabled telemedicine application and verified that I am speaking with the correct person using two identifiers.  Location: Patient: [address]  [place] [address]  Provider: [facility] Office Provider   I discussed the limitations of evaluation and management by telemedicine and the availability of in person appointments. The patient expressed understanding and agreed to proceed.    I discussed the assessment and treatment plan with the patient. The patient was provided an opportunity to ask questions and all were answered. The patient agreed with the plan and demonstrated an understanding of the instructions.   The patient was advised to call back or seek an in-person evaluation if the symptoms worsen or if the condition fails to improve as anticipated.  I provided 30 minutes of non-face-to-face time during this encounter.   [name], NP   HPI: 38 year old female presenting [facility] for follow-up.  Patient reports that she is doing very well on the current medication of 50 mg of Vyvanse .  Patient also reports that she had to go down on Zepbound  in which she needed that help to try to increase her appetite.  Patient also reports that she has been taken off her antidepressants stating that the ADHD medications is helping with her depression and she no longer needs antidepressant.  Patient reports she is very satisfied with current medication regimen and is happy with his current dosage.  Patient reports that she is wanting to move with this provider as opposed to provider and is transitioning to a new practice.  Patient states she will follow this provider up to the next practice.  Based on this assessment interview patient will continue current medication  regimen new prescriptions have been filed.  Patient with no other question concerns.  Patient is in agreement with treatment plan.  Patient denies SI, HI, AVH.  No follow-up. Visit Diagnosis:    ICD-10-CM   1. 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 Moderate episode of recurrent major depressive disorder (HCC)  F33.1       Past Psychiatric History:  Previous Psych Hospitalizations: -Denies Outpatient treatment:  -Current PCP Harlene Saddler, MD Medications Current: - Vyvanse  50mg  once daily - Celexa  20 mg once daily Next Steps: - Continue stimulant dosage, the patient is waiting for psychiatric testing referrals [facility] Medication Trials: - No medication trials Suicide & Violence: - Last episode of suicidal ideation with passive thoughts reported 6 months ago -Currently denies SI, HI, AVH -Reports that emotional distress with husband usually causes SI thoughts Substance Use: - Denies Psychotherapy: - Currently participating in psychotherapy with Selinda Robichaud LCSW Legal:  - Denies  Past Medical History:  Past Medical History:  Diagnosis Date   Depression    DVT (deep venous thrombosis) (HCC)    PE (pulmonary embolism)    Postpartum depression 08/12/2015   Protein S deficiency     Past Surgical History:  Procedure Laterality Date   ANKLE SURGERY Left    CESAREAN SECTION N/A 07/17/2016   Procedure: C-Section;  Surgeon: Mitzie BROCKS Ward, MD;  Location: [facility] ORS;  Service: Obstetrics;  Laterality: N/A;   CHOLECYSTECTOMY N/A 04/23/2017   Procedure: LAPAROSCOPIC CHOLECYSTECTOMY;  Surgeon: Nicholaus Selinda Birmingham, MD;  Location: [facility] ORS;  Service: General;  Laterality: N/A;    Family Psychiatric History: No additional  Family History:  Family History  Problem Relation Age of Onset   Hyperlipidemia Mother    Depression Mother    Heart attack Father    Heart disease Father    Deep vein thrombosis Brother    Diabetes  Maternal Grandfather    Bladder Cancer Maternal Grandfather     Social History:  Social History   Socioeconomic History   Marital status: Married    Spouse name: Michelle Hogan   Number of children: Not on file   Years of education: Not on file   Highest education level: Not on file  Occupational History   Not on file  Tobacco Use   Smoking status: Never   Smokeless tobacco: Never  Vaping Use   Vaping status: Never Used  Substance and Sexual Activity   Alcohol use: Not Currently    Alcohol/week: 1.0 standard drink of alcohol    Types: 1 Cans of beer per week    Comment: rarely   Drug use: No   Sexual activity: Yes    Birth control/protection: I.U.D.  Other Topics Concern   Not on file  Social History Narrative   Not on file   Social Drivers of Health   Financial Resource Strain: Low Risk  (09/12/2023)   [facility]   Overall Financial Resource Strain (CARDIA)    Difficulty of Paying Living Expenses: Not hard at all  Food Insecurity: No Food Insecurity (09/12/2023)   Received from [facility]   Hunger Vital Sign    Within the past 12 months, you worried that your food would run out before you got the money to buy more.: Never true    Within the past 12 months, the food you bought just didn't last and you didn't have money to get more.: Never true  Transportation Needs: No Transportation Needs (09/12/2023)   Received from [facility] - Transportation    In the past 12 months, has lack of transportation kept you from medical appointments or from getting medications?: No    Lack of Transportation (Non-Medical): No  Physical Activity: Not on file  Stress: Not on file  Social Connections: Not on file    Allergies: No Known Allergies  Metabolic Disorder Labs: Lab Results  Component Value Date   HGBA1C 5.4 05/23/2023   No results found for: PROLACTIN Lab Results  Component Value Date   CHOL 189  05/23/2023   TRIG 167 (A) 05/23/2023   HDL 44 05/23/2023   CHOLHDL 5.4 (H) 07/13/2015   LDLCALC 117 05/23/2023   LDLCALC 154 (H) 07/13/2015   Lab Results  Component Value Date   TSH 2.12 05/23/2023   TSH 1.690 07/13/2015    Therapeutic Level Labs: No results found for: LITHIUM No results found for: VALPROATE No results found for: CBMZ  Current Medications: Current Outpatient Medications  Medication Sig Dispense Refill   citalopram  (CELEXA ) 20 MG tablet TAKE 1 TABLET BY MOUTH EVERY DAY 90 tablet 1   lisdexamfetamine (VYVANSE ) 50 MG capsule Take 1 capsule (50 mg total) by mouth daily. 30 capsule 0   rivaroxaban  (XARELTO ) 20 MG TABS tablet Take 1 tablet (20 mg total) by mouth daily with supper. 90 tablet 1   spironolactone (ALDACTONE) 100 MG tablet Take 100 mg by mouth daily.     tirzepatide  (ZEPBOUND ) 10 MG/0.5ML Pen Inject 10 mg  into the skin once a week. 6 mL 1   tretinoin (RETIN-A) 0.025 % cream Apply topically at bedtime.     triamcinolone ointment (KENALOG) 0.1 % Apply 1 Application topically 2 (two) times daily. (Patient not taking: Reported on 01/09/2024)     No current facility-administered medications for this visit.     Musculoskeletal: Strength & Muscle Tone: within normal limits Gait & Station: normal Patient leans: N/A   Psychiatric Specialty Exam: Review of Systems  Constitutional: Negative.   HENT: Negative.    Eyes: Negative.   Respiratory: Negative.    Cardiovascular: Negative.   Gastrointestinal: Negative.   Endocrine: Negative.   Genitourinary: Negative.   Musculoskeletal: Negative.   Skin: Negative.   Allergic/Immunologic: Negative.   Neurological: Negative.   Hematological: Negative.   Psychiatric/Behavioral:  Positive for decreased concentration. The patient is nervous/anxious.      There were no vitals taken for this visit.There is no height or weight on file to calculate BMI.  General Appearance: Well Groomed  Eye Contact:  Good   Speech:  Clear and Coherent  Volume:  Normal  Mood:  Anxious and Depressed  Affect:  Appropriate  Thought Process:  Coherent  Orientation:  Full (Time, Place, and Person)  Thought Content: Logical   Suicidal Thoughts:  No  Homicidal Thoughts:  No  Memory:  Immediate;   Good Recent;   Good Remote;   Good  Judgement:  Good  Insight:  Good  Psychomotor Activity:  Normal  Concentration:  Concentration: Fair and Attention Span: Fair  Recall:  Good  Fund of Knowledge: Good  Language: Good  Akathisia:  No  Handed:  Right  AIMS (if indicated):   Assets:  Desire for Improvement Financial Resources/Insurance Housing  ADL's:  Intact  Cognition: WNL  Sleep:  Good   Screenings: GAD-7    Flowsheet Row Office Visit from 02/26/2024 in [facility] at [facility] Office Visit from 01/16/2024 in [facility] Office Visit from 01/09/2024 in [facility] Primary Care [facility] at [facility] Office Visit from 10/18/2023 in [facility] Office Visit from 10/09/2023 in [facility] at [facility]  Total GAD-7 Score 0 10 9 5 8    PHQ2-9    Flowsheet Row Office Visit from 02/26/2024 in [facility] Primary Care & [facility] at [facility] Office Visit from 01/16/2024 in [facility] Office Visit from 01/09/2024 in [facility] Primary Care & [facility] Medicine at [facility] Office Visit from 10/18/2023 in [facility] Office Visit from 10/09/2023 in [facility] at [facility]  PHQ-2 Total Score 0 2 3 2 2   PHQ-9 Total Score 7 -- 15 11 13    Flowsheet Row UC from 11/30/2022 in [facility] at [facility]  ED from 06/20/2021 in [facility] Emergency Department at [facility]  C-SSRS RISK CATEGORY No Risk No Risk      Assessment and Plan:  - Diagnosis: Attention deficit hyperactivity disorder (ADHD), unspecified ADHD type [F90.9]  2. Moderate episode of recurrent major depressive disorder (HCC) [F33.1]   - Risk Factors: Worsening symptoms, Suicide risk  Plan - Medications:  Continue Vyvanse  50mg , for ADHD, unspecified.  Recommended supplements - Psychotherapy: Currently in therapy with Jason Robichaud. - Education: Patient has been educated on how to contact this provider through my chart messaging  or by calling the clinic.  Patient has been educated on medications, purpose, side effects, and adverse reactions.  Patient has been also been educated that if she has depressive symptoms she is advised not to have a firearm at home but she states that the firearm is secured and locked away and ammunition is separated with another locked box. - Follow-Up: Patient will be on a maintenance schedule - Referrals: [facility] for ADHD Testing, currently under testing, estimated completion in 2 weeks.  - Safety Planning: The patient has been educated, if they should have suicidal thoughts with or without a plan to call 911, or go to the closest emergency department.  Pt verbalized understanding.  Pt reports having a firearm in the home but states that the gun is locked with a trigger guard lock and the ammunition is separated and another lockbox and both are secured in a locked state..  Pt also agrees to call the clinic should they have worsening symptoms before the next appointment.   Patient/Guardian was advised Release of Information must be obtained prior to any record release in order to collaborate their care with an outside provider. Patient/Guardian was advised if they have not already done so to contact the registration department to sign all necessary forms in order for us  to release information regarding their care.   Consent: Patient/Guardian gives verbal consent for treatment and assignment of  benefits for services provided during this visit. Patient/Guardian expressed understanding and agreed to proceed.    [name], NP 03/14/2024, 9:34 AM

## 2024-03-17 DIAGNOSIS — F419 Anxiety disorder, unspecified: Secondary | ICD-10-CM | POA: Diagnosis not present

## 2024-03-24 DIAGNOSIS — H0100A Unspecified blepharitis right eye, upper and lower eyelids: Secondary | ICD-10-CM | POA: Diagnosis not present

## 2024-03-24 DIAGNOSIS — B8809 Other acariasis: Secondary | ICD-10-CM | POA: Diagnosis not present

## 2024-03-26 DIAGNOSIS — L700 Acne vulgaris: Secondary | ICD-10-CM | POA: Diagnosis not present

## 2024-03-26 DIAGNOSIS — L814 Other melanin hyperpigmentation: Secondary | ICD-10-CM | POA: Diagnosis not present

## 2024-03-26 DIAGNOSIS — L230 Allergic contact dermatitis due to metals: Secondary | ICD-10-CM | POA: Diagnosis not present

## 2024-03-26 DIAGNOSIS — Z79899 Other long term (current) drug therapy: Secondary | ICD-10-CM | POA: Diagnosis not present

## 2024-04-23 ENCOUNTER — Ambulatory Visit: Payer: Self-pay

## 2024-04-23 NOTE — Telephone Encounter (Signed)
 FYI Only or Action Required?: FYI only for provider: recommended to urgent care.  Patient was last seen in primary care on 02/26/2024 by Michelle Raisin, MD.  Called Nurse Triage reporting Cough.  Symptoms began several days ago.  Interventions attempted: Rest, hydration, or home remedies.  Symptoms are: unchanged.  Triage Disposition: See Physician Within 24 Hours  Patient/caregiver understands and will follow disposition?: Yes  Copied from CRM #[id number]. Topic: Clinical - Red Word Triage >> Apr 23, 2024 10:34 AM [place] MATSU wrote: Red Word that prompted transfer to Nurse Triage:  cold sysmptoms before christmas throat swelling and cough making it worse feels like its closing or breathing through a straw. Reason for Disposition  SEVERE coughing spells (e.g., whooping sound after coughing, vomiting after coughing)  Answer Assessment - Initial Assessment Questions Patient reports cold symptoms since before Christmas. Reports cough that is currently mild but is having severe episodes of coughing. Recommended to urgent care   1. ONSET: When did the cough begin?      Started before Christmas 2. SEVERITY: How bad is the cough today?      Mild but will have episodes of severe cough 3. SPUTUM: Describe the color of your sputum (e.g., none, dry cough; clear, white, yellow, green)     dry 4. HEMOPTYSIS: Are you coughing up any blood? If Yes, ask: How much? (e.g., flecks, streaks, tablespoons, etc.)     no 5. DIFFICULTY BREATHING: Are you having difficulty breathing? If Yes, ask: How bad is it? (e.g., mild, moderate, severe)      no 6. FEVER: Do you have a fever? If Yes, ask: What is your temperature, how was it measured, and when did it start?     no 7. CARDIAC HISTORY: Do you have any history of heart disease? (e.g., heart attack, congestive heart failure)      no 8. LUNG HISTORY: Do you have any history of lung disease?  (e.g., pulmonary embolus, asthma, emphysema)      no 9. PE RISK FACTORS: Do you have a history of blood clots? (or: recent major surgery, recent prolonged travel, bedridden)     Yes-on blood thinners 10. OTHER SYMPTOMS: Do you have any other symptoms? (e.g., runny nose, wheezing, chest pain)       Wheezing at times, runny nose 11. PREGNANCY: Is there any chance you are pregnant? When was your last menstrual period?       no 12. TRAVEL: Have you traveled out of the country in the last month? (e.g., travel history, exposures)       no  Protocols used: Cough - Acute Non-Productive-A-AH

## 2024-04-23 NOTE — Telephone Encounter (Signed)
 Pt told go to to UC  Noted  KP

## 2024-05-16 ENCOUNTER — Encounter: Payer: Self-pay | Admitting: Student

## 2024-05-16 ENCOUNTER — Ambulatory Visit: Admitting: Student

## 2024-05-16 VITALS — BP 115/75 | HR 102 | Temp 98.1°F | Ht 65.0 in | Wt 218.1 lb

## 2024-05-16 DIAGNOSIS — R87620 Atypical squamous cells of undetermined significance on cytologic smear of vagina (ASC-US): Secondary | ICD-10-CM | POA: Diagnosis not present

## 2024-05-16 DIAGNOSIS — F331 Major depressive disorder, recurrent, moderate: Secondary | ICD-10-CM

## 2024-05-16 DIAGNOSIS — E782 Mixed hyperlipidemia: Secondary | ICD-10-CM

## 2024-05-16 DIAGNOSIS — Z23 Encounter for immunization: Secondary | ICD-10-CM | POA: Diagnosis not present

## 2024-05-16 DIAGNOSIS — Z0000 Encounter for general adult medical examination without abnormal findings: Secondary | ICD-10-CM | POA: Diagnosis not present

## 2024-05-16 MED ORDER — ZEPBOUND 12.5 MG/0.5ML ~~LOC~~ SOAJ: 12.5000 mg | SUBCUTANEOUS | 1 refills | Status: AC

## 2024-05-16 NOTE — Assessment & Plan Note (Addendum)
 Weight is down to 218lbs from 222 lbs from last visit in November. Slowing weight loss. Increase zepbound  to 12.5 mg weekly.

## 2024-05-16 NOTE — Assessment & Plan Note (Addendum)
 Mood is stable off celexa , stopped SSRi without significant side effects. Monitor mood periodically.

## 2024-05-17 LAB — COMPREHENSIVE METABOLIC PANEL WITH GFR
ALT: 11 [IU]/L (ref 0–32)
AST: 14 [IU]/L (ref 0–40)
Albumin: 4.8 g/dL (ref 3.9–4.9)
Alkaline Phosphatase: 73 [IU]/L (ref 41–116)
BUN/Creatinine Ratio: 12 (ref 9–23)
BUN: 11 mg/dL (ref 6–20)
Bilirubin Total: 0.4 mg/dL (ref 0.0–1.2)
CO2: 21 mmol/L (ref 20–29)
Calcium: 9.8 mg/dL (ref 8.7–10.2)
Chloride: 103 mmol/L (ref 96–106)
Creatinine, Ser: 0.94 mg/dL (ref 0.57–1.00)
Globulin, Total: 2.4 g/dL (ref 1.5–4.5)
Glucose: 87 mg/dL (ref 70–99)
Potassium: 4.4 mmol/L (ref 3.5–5.2)
Sodium: 139 mmol/L (ref 134–144)
Total Protein: 7.2 g/dL (ref 6.0–8.5)
eGFR: 79 mL/min/{1.73_m2}

## 2024-05-17 LAB — HEMOGLOBIN A1C
Est. average glucose Bld gHb Est-mCnc: 94 mg/dL
Hgb A1c MFr Bld: 4.9 % (ref 4.8–5.6)

## 2024-05-17 LAB — LIPID PANEL
Chol/HDL Ratio: 4.7 ratio — ABNORMAL HIGH (ref 0.0–4.4)
Cholesterol, Total: 218 mg/dL — ABNORMAL HIGH (ref 100–199)
HDL: 46 mg/dL
LDL Chol Calc (NIH): 150 mg/dL — ABNORMAL HIGH (ref 0–99)
Triglycerides: 120 mg/dL (ref 0–149)
VLDL Cholesterol Cal: 22 mg/dL (ref 5–40)

## 2024-05-17 LAB — CBC
Hematocrit: 43.2 % (ref 34.0–46.6)
Hemoglobin: 14.4 g/dL (ref 11.1–15.9)
MCH: 29.7 pg (ref 26.6–33.0)
MCHC: 33.3 g/dL (ref 31.5–35.7)
MCV: 89 fL (ref 79–97)
Platelets: 300 10*3/uL (ref 150–450)
RBC: 4.85 x10E6/uL (ref 3.77–5.28)
RDW: 13.2 % (ref 11.7–15.4)
WBC: 6.4 10*3/uL (ref 3.4–10.8)

## 2024-05-17 LAB — TSH: TSH: 1.78 u[IU]/mL (ref 0.450–4.500)

## 2024-05-20 ENCOUNTER — Ambulatory Visit: Payer: Self-pay | Admitting: Student

## 2024-05-20 NOTE — Progress Notes (Signed)
 "  Complete physical exam  Patient: Michelle Hogan   DOB: 1985/09/27   39 y.o. Female  MRN: [id number]  Subjective:    Chief Complaint  Patient presents with   Annual Exam    Michelle Hogan is a 39 y.o. female who presents today for a complete physical exam. She reports consuming a general diet.   She generally feels well. She reports sleeping well. She does not have additional problems to discuss today.      Patient Active Problem List   Diagnosis Date Noted   Annual physical exam 05/26/2024   Atypical squamous cell changes of undetermined significance (ASCUS) on vaginal cytology 05/26/2024   Acne vulgaris 10/09/2023   Morbid obesity (HCC) 09/05/2023   OSA (obstructive sleep apnea) 09/05/2023   Moderate episode of recurrent major depressive disorder (HCC) 09/05/2023   Protein S deficiency 12/18/2018   Chronic anticoagulation 12/18/2018   History of DVT (deep vein thrombosis) 04/19/2017   Hyperlipidemia 08/12/2015   FH: heart disease 08/12/2015   Hx of pulmonary embolus 01/27/2015      Patient Care Team: Lemon Raisin, MD as PCP - General (Internal Medicine)   Show/hide medication list[1]  ROS Refer to HPI     Objective:    BP 115/75   Pulse (!) 102   Temp 98.1 F (36.7 C) (Oral)   Ht 5' 5 (1.651 m)   Wt 218 lb 2 oz (98.9 kg)   LMP  (LMP Unknown)   SpO2 98%   BMI 36.30 kg/m  BP Readings from Last 3 Encounters:  05/16/24 115/75  02/26/24 122/80  01/09/24 124/82    Physical Exam Constitutional:      Appearance: Normal appearance.  HENT:     Head: Normocephalic and atraumatic.     Mouth/Throat:     Mouth: Mucous membranes are moist.     Pharynx: Oropharynx is clear.  Eyes:     Extraocular Movements: Extraocular movements intact.     Conjunctiva/sclera: Conjunctivae normal.     Pupils: Pupils are equal, round, and reactive to light.  Neck:     Comments: No mass Cardiovascular:     Rate and Rhythm: Normal rate and regular rhythm.     Heart  sounds: No murmur heard. Pulmonary:     Effort: Pulmonary effort is normal.     Breath sounds: No rhonchi or rales.  Abdominal:     General: Abdomen is flat. Bowel sounds are normal. There is no distension.     Palpations: Abdomen is soft.     Tenderness: There is no abdominal tenderness.  Musculoskeletal:        General: Normal range of motion.     Cervical back: No tenderness.     Right lower leg: No edema.     Left lower leg: No edema.  Skin:    General: Skin is warm and dry.     Capillary Refill: Capillary refill takes less than 2 seconds.  Neurological:     General: No focal deficit present.     Mental Status: She is alert and oriented to person, place, and time.  Psychiatric:        Mood and Affect: Mood normal.        Behavior: Behavior normal.         02/26/2024    2:37 PM 01/16/2024   10:27 AM 01/09/2024   11:27 AM  Depression screen PHQ 2/9  Decreased Interest 0  1  Down, Depressed, Hopeless 0  2  PHQ - 2 Score 0  3  Altered sleeping 3  3  Tired, decreased energy 2  3  Change in appetite 2  1  Feeling bad or failure about yourself  0  1  Trouble concentrating 0  2  Moving slowly or fidgety/restless 0  2  Suicidal thoughts 0  0  PHQ-9 Score 7   15   Difficult doing work/chores Not difficult at all  Very difficult     Information is confidential and restricted. Go to Review Flowsheets to unlock data.   Data saved with a previous flowsheet row definition      02/26/2024    2:37 PM 01/16/2024   10:28 AM 01/09/2024   11:27 AM 10/18/2023   10:06 AM  GAD 7 : Generalized Anxiety Score  Nervous, Anxious, on Edge 0   2    Control/stop worrying 0   1    Worry too much - different things 0   1    Trouble relaxing 0   1    Restless 0   2    Easily annoyed or irritable 0   1    Afraid - awful might happen 0   1    Total GAD 7 Score 0  9   Anxiety Difficulty Not difficult at all  Somewhat difficult      Information is confidential and restricted. Go to Review  Flowsheets to unlock data.   Data saved with a previous flowsheet row definition    Results for orders placed or performed in visit on 05/16/24  CBC  Result Value Ref Range   WBC 6.4 3.4 - 10.8 x10E3/uL   RBC 4.85 3.77 - 5.28 x10E6/uL   Hemoglobin 14.4 11.1 - 15.9 g/dL   Hematocrit 56.7 65.9 - 46.6 %   MCV 89 79 - 97 fL   MCH 29.7 26.6 - 33.0 pg   MCHC 33.3 31.5 - 35.7 g/dL   RDW 86.7 88.2 - 84.5 %   Platelets 300 150 - 450 x10E3/uL  Lipid panel  Result Value Ref Range   Cholesterol, Total 218 (H) 100 - 199 mg/dL   Triglycerides 879 0 - 149 mg/dL   HDL 46 >60 mg/dL   VLDL Cholesterol Cal 22 5 - 40 mg/dL   LDL Chol Calc (NIH) 849 (H) 0 - 99 mg/dL   Chol/HDL Ratio 4.7 (H) 0.0 - 4.4 ratio  Comprehensive Metabolic Panel (CMET)  Result Value Ref Range   Glucose 87 70 - 99 mg/dL   BUN 11 6 - 20 mg/dL   Creatinine, Ser 9.05 0.57 - 1.00 mg/dL   eGFR 79 >40 fO/fpw/8.26   BUN/Creatinine Ratio 12 9 - 23   Sodium 139 134 - 144 mmol/L   Potassium 4.4 3.5 - 5.2 mmol/L   Chloride 103 96 - 106 mmol/L   CO2 21 20 - 29 mmol/L   Calcium 9.8 8.7 - 10.2 mg/dL   Total Protein 7.2 6.0 - 8.5 g/dL   Albumin 4.8 3.9 - 4.9 g/dL   Globulin, Total 2.4 1.5 - 4.5 g/dL   Bilirubin Total 0.4 0.0 - 1.2 mg/dL   Alkaline Phosphatase 73 41 - 116 IU/L   AST 14 0 - 40 IU/L   ALT 11 0 - 32 IU/L  HgB A1c  Result Value Ref Range   Hgb A1c MFr Bld 4.9 4.8 - 5.6 %   Est. average glucose Bld gHb Est-mCnc 94 mg/dL  TSH  Result Value Ref Range   TSH  1.780 0.450 - 4.500 uIU/mL  HM PAP SMEAR  Result Value Ref Range   HM Pap smear ASCUS        Assessment & Plan:    Routine Health Maintenance and Physical Exam  Health Maintenance  Topic Date Due   Hepatitis C Screening  Never done   Hepatitis B Vaccine (1 of 3 - 19+ 3-dose series) Never done   COVID-19 Vaccine (1 - 2025-26 season) 01/24/2025*   Pap with HPV screening  09/12/2026   DTaP/Tdap/Td vaccine (3 - Td or Tdap) 08/28/2029   Flu Shot  Completed    HPV Vaccine (No Doses Required) Completed   HIV Screening  Completed   Pneumococcal Vaccine  Aged Out   Meningitis B Vaccine  Aged Out  *Topic was postponed. The date shown is not the original due date.    Discussed health benefits of physical activity, and encouraged her to engage in regular exercise appropriate for her age and condition.  Annual physical exam Assessment & Plan: Labs today.  Flu vaccine administered.  Declined STII screening. Follow GYN for cervical cancer screening.  Beginning breast cancer screening at at age 61.   Orders: -     CBC -     Lipid panel -     Comprehensive metabolic panel with GFR -     Hemoglobin A1c -     TSH  Morbid obesity (HCC) Assessment & Plan: Weight is down to 218lbs from 222 lbs from last visit in November. Slowing weight loss. Increase zepbound  to 12.5 mg weekly.   Orders: -     Lipid panel -     Hemoglobin A1c  Moderate episode of recurrent major depressive disorder (HCC) Assessment & Plan: Mood is stable off celexa , stopped SSRi without significant side effects. Monitor mood periodically.    Immunization due -     Flu vaccine, recombinant, trivalent, inj  Moderate mixed hyperlipidemia not requiring statin therapy Assessment & Plan: Lipid panel today  Orders: -     Lipid panel  Atypical squamous cell changes of undetermined significance (ASCUS) on vaginal cytology Assessment & Plan: Noted on 09/12/2023, no symptoms, has follow up PAP with GYN.    Other orders -     Zepbound ; Inject 12.5 mg into the skin once a week.  Dispense: 6 mL; Refill: 1    Return in about 6 months (around 11/13/2024) for  obsity .     Harlene Saddler, MD     [1]  Outpatient Medications Prior to Visit  Medication Sig   lisdexamfetamine  (VYVANSE ) 50 MG capsule Take 1 capsule (50 mg total) by mouth daily.   lisdexamfetamine  (VYVANSE ) 50 MG capsule Take 1 capsule (50 mg total) by mouth daily.   rivaroxaban  (XARELTO ) 20 MG TABS tablet  Take 1 tablet (20 mg total) by mouth daily with supper.   spironolactone (ALDACTONE) 100 MG tablet Take 100 mg by mouth daily.   tretinoin (RETIN-A) 0.025 % cream Apply topically at bedtime.   triamcinolone ointment (KENALOG) 0.1 % Apply 1 Application topically 2 (two) times daily. (Patient taking differently: Apply 1 Application topically as needed.)   [DISCONTINUED] tirzepatide  (ZEPBOUND ) 10 MG/0.5ML Pen Inject 10 mg into the skin once a week.   lisdexamfetamine  (VYVANSE ) 50 MG capsule Take 1 capsule (50 mg total) by mouth daily. (Patient not taking: Reported on 05/16/2024)   [DISCONTINUED] citalopram  (CELEXA ) 20 MG tablet TAKE 1 TABLET BY MOUTH EVERY DAY (Patient not taking: Reported on 05/16/2024)   No facility-administered medications  prior to visit.   "

## 2024-05-21 ENCOUNTER — Telehealth: Payer: Self-pay

## 2024-05-23 ENCOUNTER — Other Ambulatory Visit (HOSPITAL_COMMUNITY): Payer: Self-pay

## 2024-05-26 DIAGNOSIS — Z0000 Encounter for general adult medical examination without abnormal findings: Secondary | ICD-10-CM | POA: Insufficient documentation

## 2024-05-26 DIAGNOSIS — R87620 Atypical squamous cells of undetermined significance on cytologic smear of vagina (ASC-US): Secondary | ICD-10-CM | POA: Insufficient documentation

## 2024-05-26 NOTE — Assessment & Plan Note (Signed)
 Lipid panel today

## 2024-05-26 NOTE — Assessment & Plan Note (Signed)
 Labs today.  Flu vaccine administered.  Declined STII screening. Follow GYN for cervical cancer screening.  Beginning breast cancer screening at at age 39.

## 2024-05-26 NOTE — Assessment & Plan Note (Signed)
 Noted on 09/12/2023, no symptoms, has follow up PAP with GYN.

## 2024-05-27 NOTE — Telephone Encounter (Signed)
 Good morning Kiana,   Could you assist with this PA problem?  Thank you

## 2024-05-28 ENCOUNTER — Other Ambulatory Visit (HOSPITAL_COMMUNITY): Payer: Self-pay

## 2024-05-28 NOTE — Telephone Encounter (Signed)
 Hi Itzel, we have a dedicated patient advocate specialist who handles all of our GLP PA's and I forwarded this message to her, please keep in mind that the GLP PA's are plentiful and she will get to this request in the order they come to her.  Thanks!

## 2024-05-30 ENCOUNTER — Telehealth: Payer: Self-pay

## 2024-05-30 ENCOUNTER — Other Ambulatory Visit (HOSPITAL_COMMUNITY): Payer: Self-pay

## 2024-05-30 NOTE — Telephone Encounter (Signed)
 Pharmacy Patient Advocate Encounter   Received notification from Pt Calls Messages that prior authorization for Zepbound  12.5mg /0.64ml is required/requested.   Insurance verification completed.   The patient is insured through [facility] [employer].   Per test claim: PA required; PA submitted to above mentioned insurance via Latent Key/confirmation #/EOC [id number] Status is pending

## 2024-11-14 ENCOUNTER — Ambulatory Visit: Admitting: Student
